# Patient Record
Sex: Female | Born: 1945 | Race: Asian | Hispanic: No | Marital: Married | Smoking: Current every day smoker
Health system: Southern US, Community
[De-identification: ages and names within clinical notes are randomized; demographics above are authoritative.]

## PROBLEM LIST (undated history)

## (undated) DIAGNOSIS — G43909 Migraine, unspecified, not intractable, without status migrainosus: Secondary | ICD-10-CM

## (undated) DIAGNOSIS — E119 Type 2 diabetes mellitus without complications: Secondary | ICD-10-CM

## (undated) DIAGNOSIS — N029 Recurrent and persistent hematuria with unspecified morphologic changes: Secondary | ICD-10-CM

## (undated) DIAGNOSIS — I10 Essential (primary) hypertension: Secondary | ICD-10-CM

## (undated) DIAGNOSIS — K224 Dyskinesia of esophagus: Secondary | ICD-10-CM

## (undated) DIAGNOSIS — E785 Hyperlipidemia, unspecified: Secondary | ICD-10-CM

## (undated) DIAGNOSIS — Z720 Tobacco use: Secondary | ICD-10-CM

## (undated) HISTORY — DX: Tobacco use: Z72.0

## (undated) HISTORY — DX: Dyskinesia of esophagus: K22.4

## (undated) HISTORY — DX: Recurrent and persistent hematuria with unspecified morphologic changes: N02.9

## (undated) HISTORY — PX: MOUTH SURGERY: SHX715

## (undated) HISTORY — DX: Essential (primary) hypertension: I10

## (undated) HISTORY — DX: Hyperlipidemia, unspecified: E78.5

## (undated) HISTORY — DX: Migraine, unspecified, not intractable, without status migrainosus: G43.909

## (undated) HISTORY — PX: ABDOMINAL HYSTERECTOMY: SHX81

## (undated) HISTORY — DX: Type 2 diabetes mellitus without complications: E11.9

---

## 1977-11-27 HISTORY — PX: TUBAL LIGATION: SHX77

## 2002-01-28 ENCOUNTER — Other Ambulatory Visit: Admission: RE | Admit: 2002-01-28 | Discharge: 2002-01-28 | Payer: Self-pay | Admitting: Gynecology

## 2002-04-14 ENCOUNTER — Ambulatory Visit (HOSPITAL_COMMUNITY): Admission: RE | Admit: 2002-04-14 | Discharge: 2002-04-14 | Payer: Self-pay | Admitting: Gynecology

## 2002-04-14 ENCOUNTER — Encounter (INDEPENDENT_AMBULATORY_CARE_PROVIDER_SITE_OTHER): Payer: Self-pay | Admitting: Specialist

## 2003-05-18 ENCOUNTER — Other Ambulatory Visit: Admission: RE | Admit: 2003-05-18 | Discharge: 2003-05-18 | Payer: Self-pay | Admitting: Gynecology

## 2006-02-06 ENCOUNTER — Ambulatory Visit: Payer: Self-pay | Admitting: Internal Medicine

## 2006-02-12 ENCOUNTER — Ambulatory Visit: Payer: Self-pay | Admitting: Internal Medicine

## 2006-03-12 ENCOUNTER — Ambulatory Visit: Payer: Self-pay | Admitting: Internal Medicine

## 2006-03-19 ENCOUNTER — Ambulatory Visit: Payer: Self-pay | Admitting: Internal Medicine

## 2006-06-21 ENCOUNTER — Ambulatory Visit: Payer: Self-pay | Admitting: Internal Medicine

## 2006-08-30 ENCOUNTER — Ambulatory Visit: Payer: Self-pay | Admitting: Internal Medicine

## 2006-09-06 ENCOUNTER — Ambulatory Visit: Payer: Self-pay | Admitting: Internal Medicine

## 2007-01-24 ENCOUNTER — Ambulatory Visit: Payer: Self-pay | Admitting: Pulmonary Disease

## 2007-02-22 ENCOUNTER — Ambulatory Visit: Payer: Self-pay | Admitting: Internal Medicine

## 2007-02-22 LAB — CONVERTED CEMR LAB
ALT: 25 units/L (ref 0–40)
AST: 26 units/L (ref 0–37)
Albumin: 3.4 g/dL — ABNORMAL LOW (ref 3.5–5.2)
Alkaline Phosphatase: 75 units/L (ref 39–117)
Calcium: 9.1 mg/dL (ref 8.4–10.5)
Chloride: 106 meq/L (ref 96–112)
GFR calc non Af Amer: 135 mL/min
Glucose, Bld: 108 mg/dL — ABNORMAL HIGH (ref 70–99)
Potassium: 4.3 meq/L (ref 3.5–5.1)
Total Bilirubin: 0.8 mg/dL (ref 0.3–1.2)
Total Protein: 6.3 g/dL (ref 6.0–8.3)

## 2007-03-06 ENCOUNTER — Ambulatory Visit: Payer: Self-pay | Admitting: Internal Medicine

## 2007-07-15 ENCOUNTER — Encounter: Payer: Self-pay | Admitting: Internal Medicine

## 2007-07-15 LAB — CONVERTED CEMR LAB

## 2007-09-02 ENCOUNTER — Ambulatory Visit: Payer: Self-pay | Admitting: Internal Medicine

## 2007-09-02 LAB — CONVERTED CEMR LAB
Basophils Absolute: 0 10*3/uL (ref 0.0–0.1)
CO2: 32 meq/L (ref 19–32)
Creatinine, Ser: 0.5 mg/dL (ref 0.4–1.2)
GFR calc Af Amer: 163 mL/min
Glucose, Bld: 106 mg/dL — ABNORMAL HIGH (ref 70–99)
LDL Cholesterol: 85 mg/dL (ref 0–99)
Lymphocytes Relative: 50.3 % — ABNORMAL HIGH (ref 12.0–46.0)
MCHC: 34.4 g/dL (ref 30.0–36.0)
Monocytes Absolute: 0.2 10*3/uL (ref 0.2–0.7)
Monocytes Relative: 3.8 % (ref 3.0–11.0)
Neutro Abs: 2.8 10*3/uL (ref 1.4–7.7)
Platelets: 249 10*3/uL (ref 150–400)
Potassium: 4.4 meq/L (ref 3.5–5.1)
RDW: 13 % (ref 11.5–14.6)
Sodium: 143 meq/L (ref 135–145)
Total Bilirubin: 1.1 mg/dL (ref 0.3–1.2)
Total CHOL/HDL Ratio: 2.7
Triglycerides: 159 mg/dL — ABNORMAL HIGH (ref 0–149)
VLDL: 32 mg/dL (ref 0–40)

## 2007-09-06 ENCOUNTER — Ambulatory Visit: Payer: Self-pay | Admitting: Internal Medicine

## 2007-10-30 ENCOUNTER — Encounter: Payer: Self-pay | Admitting: Internal Medicine

## 2007-10-30 DIAGNOSIS — I10 Essential (primary) hypertension: Secondary | ICD-10-CM | POA: Insufficient documentation

## 2007-10-30 DIAGNOSIS — M810 Age-related osteoporosis without current pathological fracture: Secondary | ICD-10-CM | POA: Insufficient documentation

## 2007-10-30 DIAGNOSIS — E785 Hyperlipidemia, unspecified: Secondary | ICD-10-CM | POA: Insufficient documentation

## 2007-10-30 DIAGNOSIS — F17200 Nicotine dependence, unspecified, uncomplicated: Secondary | ICD-10-CM | POA: Insufficient documentation

## 2007-10-30 DIAGNOSIS — D7289 Other specified disorders of white blood cells: Secondary | ICD-10-CM | POA: Insufficient documentation

## 2007-10-30 DIAGNOSIS — E119 Type 2 diabetes mellitus without complications: Secondary | ICD-10-CM | POA: Insufficient documentation

## 2007-10-31 ENCOUNTER — Ambulatory Visit: Payer: Self-pay | Admitting: Internal Medicine

## 2007-10-31 DIAGNOSIS — R42 Dizziness and giddiness: Secondary | ICD-10-CM | POA: Insufficient documentation

## 2007-11-29 ENCOUNTER — Ambulatory Visit: Payer: Self-pay | Admitting: Internal Medicine

## 2007-11-29 DIAGNOSIS — J40 Bronchitis, not specified as acute or chronic: Secondary | ICD-10-CM | POA: Insufficient documentation

## 2007-12-31 ENCOUNTER — Ambulatory Visit: Payer: Self-pay | Admitting: Internal Medicine

## 2007-12-31 ENCOUNTER — Telehealth: Payer: Self-pay | Admitting: Internal Medicine

## 2007-12-31 LAB — CONVERTED CEMR LAB
AST: 30 units/L (ref 0–37)
Cholesterol: 157 mg/dL (ref 0–200)
LDL Cholesterol: 80 mg/dL (ref 0–99)
Total CHOL/HDL Ratio: 2.7
Triglycerides: 96 mg/dL (ref 0–149)

## 2008-01-07 ENCOUNTER — Encounter: Payer: Self-pay | Admitting: Internal Medicine

## 2008-02-04 ENCOUNTER — Ambulatory Visit: Payer: Self-pay | Admitting: Internal Medicine

## 2008-02-06 ENCOUNTER — Encounter: Payer: Self-pay | Admitting: Internal Medicine

## 2008-06-16 ENCOUNTER — Ambulatory Visit: Payer: Self-pay | Admitting: Internal Medicine

## 2008-06-16 DIAGNOSIS — R0789 Other chest pain: Secondary | ICD-10-CM | POA: Insufficient documentation

## 2008-06-16 DIAGNOSIS — M25569 Pain in unspecified knee: Secondary | ICD-10-CM | POA: Insufficient documentation

## 2008-07-08 ENCOUNTER — Encounter: Payer: Self-pay | Admitting: Internal Medicine

## 2008-07-10 ENCOUNTER — Ambulatory Visit: Payer: Self-pay | Admitting: Internal Medicine

## 2008-07-10 LAB — CONVERTED CEMR LAB
AST: 33 units/L (ref 0–37)
CO2: 29 meq/L (ref 19–32)
Cholesterol: 130 mg/dL (ref 0–200)
Creatinine, Ser: 0.6 mg/dL (ref 0.4–1.2)
Creatinine,U: 29.3 mg/dL
GFR calc non Af Amer: 109 mL/min
Hgb A1c MFr Bld: 6.3 % — ABNORMAL HIGH (ref 4.6–6.0)
LDL Cholesterol: 58 mg/dL (ref 0–99)
Microalb, Ur: 0.8 mg/dL (ref 0.0–1.9)
Triglycerides: 95 mg/dL (ref 0–149)
VLDL: 19 mg/dL (ref 0–40)

## 2008-07-17 ENCOUNTER — Ambulatory Visit: Payer: Self-pay | Admitting: Internal Medicine

## 2009-01-14 ENCOUNTER — Ambulatory Visit: Payer: Self-pay | Admitting: Internal Medicine

## 2009-01-14 LAB — CONVERTED CEMR LAB
ALT: 45 units/L — ABNORMAL HIGH (ref 0–35)
AST: 34 units/L (ref 0–37)
Calcium: 9.8 mg/dL (ref 8.4–10.5)
GFR calc non Af Amer: 108 mL/min
Sodium: 141 meq/L (ref 135–145)

## 2009-01-18 ENCOUNTER — Ambulatory Visit: Payer: Self-pay | Admitting: Internal Medicine

## 2009-05-04 ENCOUNTER — Ambulatory Visit: Payer: Self-pay | Admitting: Internal Medicine

## 2009-05-04 LAB — CONVERTED CEMR LAB
ALT: 37 units/L — ABNORMAL HIGH (ref 0–35)
AST: 30 units/L (ref 0–37)
BUN: 15 mg/dL (ref 6–23)
CO2: 28 meq/L (ref 19–32)
Calcium: 9.3 mg/dL (ref 8.4–10.5)
Chloride: 110 meq/L (ref 96–112)
Glucose, Bld: 99 mg/dL (ref 70–99)
Hgb A1c MFr Bld: 6.1 % (ref 4.6–6.5)
Potassium: 4.5 meq/L (ref 3.5–5.1)

## 2009-05-05 ENCOUNTER — Encounter (INDEPENDENT_AMBULATORY_CARE_PROVIDER_SITE_OTHER): Payer: Self-pay | Admitting: *Deleted

## 2009-05-20 ENCOUNTER — Ambulatory Visit: Payer: Self-pay | Admitting: Internal Medicine

## 2009-07-06 ENCOUNTER — Telehealth: Payer: Self-pay | Admitting: Internal Medicine

## 2009-11-18 ENCOUNTER — Ambulatory Visit: Payer: Self-pay | Admitting: Internal Medicine

## 2009-11-18 LAB — CONVERTED CEMR LAB
ALT: 28 units/L (ref 0–35)
Albumin: 4.2 g/dL (ref 3.5–5.2)
Alkaline Phosphatase: 92 units/L (ref 39–117)
Calcium: 9.6 mg/dL (ref 8.4–10.5)
Chloride: 104 meq/L (ref 96–112)
Cholesterol: 170 mg/dL (ref 0–200)
Creatinine, Ser: 0.6 mg/dL (ref 0.4–1.2)
Glucose, Bld: 111 mg/dL — ABNORMAL HIGH (ref 70–99)
Potassium: 4.2 meq/L (ref 3.5–5.1)
Total Bilirubin: 1.2 mg/dL (ref 0.3–1.2)
Triglycerides: 145 mg/dL (ref 0.0–149.0)
VLDL: 29 mg/dL (ref 0.0–40.0)

## 2009-11-25 ENCOUNTER — Ambulatory Visit: Payer: Self-pay | Admitting: Internal Medicine

## 2009-12-24 ENCOUNTER — Ambulatory Visit: Payer: Self-pay | Admitting: Internal Medicine

## 2009-12-24 DIAGNOSIS — H8309 Labyrinthitis, unspecified ear: Secondary | ICD-10-CM | POA: Insufficient documentation

## 2010-06-10 ENCOUNTER — Ambulatory Visit: Payer: Self-pay | Admitting: Internal Medicine

## 2010-06-10 LAB — CONVERTED CEMR LAB
BUN: 17 mg/dL (ref 6–23)
CO2: 28 meq/L (ref 19–32)
Chloride: 107 meq/L (ref 96–112)
GFR calc non Af Amer: 127.13 mL/min (ref >60)
Hgb A1c MFr Bld: 6.2 % (ref 4.6–6.5)
Sodium: 138 meq/L (ref 135–145)

## 2010-06-16 ENCOUNTER — Ambulatory Visit: Payer: Self-pay | Admitting: Internal Medicine

## 2010-06-16 ENCOUNTER — Ambulatory Visit (HOSPITAL_BASED_OUTPATIENT_CLINIC_OR_DEPARTMENT_OTHER): Admission: RE | Admit: 2010-06-16 | Discharge: 2010-06-16 | Payer: Self-pay | Admitting: Internal Medicine

## 2010-06-16 ENCOUNTER — Ambulatory Visit: Payer: Self-pay | Admitting: Diagnostic Radiology

## 2010-06-16 DIAGNOSIS — R05 Cough: Secondary | ICD-10-CM

## 2010-06-16 DIAGNOSIS — R059 Cough, unspecified: Secondary | ICD-10-CM | POA: Insufficient documentation

## 2010-11-17 ENCOUNTER — Ambulatory Visit: Payer: Self-pay | Admitting: Internal Medicine

## 2010-11-17 ENCOUNTER — Encounter: Payer: Self-pay | Admitting: Internal Medicine

## 2010-11-17 LAB — CONVERTED CEMR LAB
Glucose, Urine, Semiquant: NEGATIVE
Nitrite: NEGATIVE
pH: 5

## 2010-12-05 ENCOUNTER — Ambulatory Visit: Admit: 2010-12-05 | Payer: Self-pay | Admitting: Internal Medicine

## 2010-12-20 ENCOUNTER — Ambulatory Visit
Admission: RE | Admit: 2010-12-20 | Discharge: 2010-12-20 | Payer: Self-pay | Source: Home / Self Care | Attending: Internal Medicine | Admitting: Internal Medicine

## 2010-12-20 DIAGNOSIS — F4321 Adjustment disorder with depressed mood: Secondary | ICD-10-CM | POA: Insufficient documentation

## 2010-12-20 DIAGNOSIS — H6690 Otitis media, unspecified, unspecified ear: Secondary | ICD-10-CM | POA: Insufficient documentation

## 2010-12-29 NOTE — Assessment & Plan Note (Signed)
Summary: uti ear ache/mhf   Vital Signs:  Patient profile:   65 year old female Height:      60 inches Weight:      127.25 pounds BMI:     24.94 O2 Sat:      99 % on Room air Temp:     97.8 degrees F oral Pulse rate:   76 / minute Resp:     18 per minute BP sitting:   140 / 90  (left arm) Cuff size:   regular  Vitals Entered By: Glendell Docker CMA (November 17, 2010 9:40 AM)  O2 Flow:  Room air CC: Urianry discomfort Is Patient Diabetic? No Pain Assessment Patient in pain? no      Comments pain with urination for the past 2 days, cranberry juice with no improvement   Primary Care Provider:  Dondra Spry DO  CC:  Urianry discomfort.  History of Present Illness: 65 y/o asian female co 2 days of dysuria no fever or chills no flank pain  Preventive Screening-Counseling & Management  Alcohol-Tobacco     Smoking Status: current  Allergies: 1)  ! Codeine 2)  ! * Antihistamines  Past History:  Past Medical History: Hypertension Hyperlipidemia Osteoporosis Tobacco Abuse     Benign hematuria - negative urologic workup  History of migraines     Past Surgical History: Colonoscopy 07/07      PMH-FH-SH reviewed-no changes except otherwise noted  Physical Exam  General:  alert, well-developed, and well-nourished.   Lungs:  normal respiratory effort and normal breath sounds.   Heart:  normal rate, regular rhythm, and no gallop.   Abdomen:  soft and non-tender.  no flank tenderness   Impression & Recommendations:  Problem # 1:  UTI (ICD-599.0)  Her updated medication list for this problem includes:    Cefuroxime Axetil 500 Mg Tabs (Cefuroxime axetil) ..... One by mouth two times a day  Encouraged to push clear liquids, get enough rest, and take acetaminophen as needed. To be seen in 10 days if no improvement, sooner if worse.  Medications Added to Medication List This Visit: 1)  Simvastatin 40 Mg Tabs (Simvastatin) .... One by mouth qpm 2)  Cefuroxime  Axetil 500 Mg Tabs (Cefuroxime axetil) .... One by mouth two times a day 3)  Lisinopril 20 Mg Tabs (Lisinopril) .... One by mouth once daily  Complete Medication List: 1)  Simvastatin 40 Mg Tabs (Simvastatin) .... One by mouth qpm 2)  Actonel 150 Mg Tabs (Risedronate sodium) .... One by mouth once monthly 3)  Fish Oil 1000 Mg Caps (Omega-3 fatty acids) .... Take 1 tablet by mouth two times a day 4)  Metformin Hcl 750 Mg Xr24h-tab (Metformin hcl) .... One by mouth bid 5)  Zolpidem Tartrate 5 Mg Tabs (Zolpidem tartrate) .... One to two tab at bedtime as needed 6)  Cefuroxime Axetil 500 Mg Tabs (Cefuroxime axetil) .... One by mouth two times a day 7)  Lisinopril 20 Mg Tabs (Lisinopril) .... One by mouth once daily  Other Orders: UA Dipstick w/o Micro (manual) (16109) T-Basic Metabolic Panel [phone]) T- Hemoglobin A1C (91478-29562) T-Hepatic Function [phone]) T-Lipid Profile (96295-28413) T-Culture, Urine (24401-02725) Specimen Handling (99000)  Patient Instructions: 1)  Keep your next appointment Prescriptions: METFORMIN HCL 750 MG XR24H-TAB (METFORMIN HCL) one by mouth bid  #180 x 5   Entered and Authorized by:   D. Thomos Lemons DO   Signed by:   D. Thomos Lemons DO on 11/17/2010   Method used:  Electronically to        [facility] [phone]* (retail)       [address] [place], [place]  [address]       Ph: [phone] or [phone]       Fax: [phone]   RxID:   [id number] SIMVASTATIN 40 MG TABS (SIMVASTATIN) one by mouth qpm  #90 x 1   Entered and Authorized by:   D. Thomos Lemons DO   Signed by:   D. Thomos Lemons DO on 11/17/2010   Method used:   Electronically to        [facility] [phone]* (retail)       [address] [place], [place]  [address]       Ph: [phone] or [phone]       Fax: [phone]   RxID:   [id number] LISINOPRIL 20 MG TABS (LISINOPRIL) one by mouth once daily  #90 x 1   Entered and Authorized by:   D. Thomos Lemons DO   Signed by:   D. Thomos Lemons DO on 11/17/2010   Method used:   Electronically to        [facility] [phone]* (retail)       [address] [place], [place]  [address]       Ph: [phone] or [phone]       Fax: [phone]   RxID:   [id number] CEFUROXIME AXETIL 500 MG TABS (CEFUROXIME AXETIL) one by mouth two times a day  #14 x 0   Entered and Authorized by:   D. Thomos Lemons DO   Signed by:   D. Thomos Lemons DO on 11/17/2010   Method used:   Electronically to        [facility] [phone]* (retail)       [address] [place], [place]  [address]       Ph: [phone] or [phone]       Fax: [phone]   RxID:   [id number]    Orders Added: 1)  UA Dipstick w/o Micro (manual) [81002] 2)  T-Basic Metabolic Panel [80048-22910] 3)  T- Hemoglobin A1C [83036-23375] 4)  T-Hepatic Function [80076-22960] 5)  T-Lipid Profile [80061-22930] 6)  T-Culture, Urine [11941-74081] 7)  Specimen Handling [99000] 8)  Est. Patient Level III [44818]    Current Allergies (reviewed today): ! CODEINE ! * ANTIHISTAMINES   Laboratory Results   Urine Tests    Routine Urinalysis   Color: yellow Appearance: Cloudy Glucose: negative   (Normal Range: Negative) Bilirubin: negative   (Normal Range: Negative) Ketone: negative   (Normal Range: Negative) Spec. Gravity: 1.010   (Normal Range: 1.003-1.035) Blood: large   (Normal Range: Negative) pH: 5.0   (Normal Range: 5.0-8.0) Protein: 30   (Normal Range: Negative) Urobilinogen: 0.2   (Normal Range: 0-1) Nitrite: negative   (Normal Range: Negative) Leukocyte Esterace: large   (Normal Range: Negative)

## 2010-12-29 NOTE — Assessment & Plan Note (Signed)
Summary: ? Inner-ear infection - jr   Vital Signs:  Patient profile:   65 year old female Weight:      126.25 pounds BMI:     24.75 O2 Sat:      95 % on Room air Temp:     97.4 degrees F Pulse rate:   75 / minute Pulse rhythm:   regular Resp:     16 per minute BP sitting:   120 / 80  (right arm) Cuff size:   regular  Vitals Entered By: Glendell Docker CMA (December 24, 2009 11:39 AM)  O2 Flow:  Room air  Primary Care Provider:  D. Thomos Lemons DO  CC:  c/o dizziness and dizziness in for initial evaluation.  History of Present Illness:       This is a 65 year old female who presents with dizziness in for initial evaluation.  The patient presents with dizziness and imbalance.  The episodes of dizziness occur several times a day.  When the episodes occur, they last for a few minutes and several minutes.  The dizzness is associated with nausea.  The episodes are brought on or made worse with bending forward.  recent cold.  left ear feels full.  symptoms started suddenly this AM    Allergies: 1)  ! Codeine 2)  ! * Antihistamines  Past History:  Past Medical History: Hypertension Hyperlipidemia Osteoporosis Tobacco Abuse   Benign hematuria - negative urologic workup  History of migraines     Past Surgical History: Colonoscopy 07/07      Family History: Mother deceased at age 61 with history of ovarian cancer She also had diabetes, hypertension, and high cholesterol Father deceased at age 19 with history of diabetes, hypertension and hyperlipidemia      Social History: Occupation:  Sports administrator Married Current Smoker (quit but restarted 1/2 pk per day)   Alcohol use-no     Physical Exam  General:  alert.  ill appearing Eyes:  pupils equal, pupils round, and pupils reactive to light.  no nystagmus Lungs:  normal respiratory effort.  slight coarse breath sounds and wheezing left base Heart:  normal rate, regular rhythm, and no gallop.   Neurologic:  cranial  nerves II-XII intact and gait normal.     Impression & Recommendations:  Problem # 1:  LABYRINTHITIS, ACUTE (ICD-386.30) recent URI.  c/o left ear discomfort and cough.  use diazepam as needed.  zofran for nausea.  push fluids once nausea improves.  Call our office if your symptoms do not  improve or gets worse.  she has coarse breath sounds left base.  I suspect early bronchitis.  ceftin 500 mg by mouth two times a day.  Complete Medication List: 1)  Lisinopril-hydrochlorothiazide 20-12.5 Mg Tabs (Lisinopril-hydrochlorothiazide) .... One by mouth qd 2)  Simvastatin 40 Mg Tabs (Simvastatin) .... One by mouth qpm 3)  Actonel 150 Mg Tabs (Risedronate sodium) .... One by mouth once monthly 4)  Fish Oil 1000 Mg Caps (Omega-3 fatty acids) .... Take 1 tablet by mouth two times a day 5)  Metformin Hcl 750 Mg Xr24h-tab (Metformin hcl) .... One by mouth bid 6)  Prilosec 20 Mg Cpdr (Omeprazole) .... One by mouth two times a day before meals 7)  Zostavax 13086 Unt/0.71ml Solr (Zoster vaccine live) .... Administer vaccine x 1 8)  Diazepam 2 Mg Tabs (Diazepam) .... One by mouth two times a day as needed dizziness 9)  Cefuroxime Axetil 500 Mg Tabs (Cefuroxime axetil) .... One by  mouth two times a day 10)  Ondansetron Hcl 4 Mg Tabs (Ondansetron hcl) .... One by mouth three times a day nausea  Patient Instructions: 1)  Call our office if your symptoms do not  improve or gets worse. Prescriptions: ONDANSETRON HCL 4 MG TABS (ONDANSETRON HCL) one by mouth three times a day nausea  #21 x 0   Entered and Authorized by:   D. Thomos Lemons DO   Signed by:   D. Thomos Lemons DO on 12/24/2009   Method used:   Electronically to        [facility] [phone]* (retail)       [address] [place], [place]  [address]       Ph: [phone] or [phone]       Fax: [phone]   RxID:   [id number] CEFUROXIME AXETIL 500 MG TABS (CEFUROXIME AXETIL) one by mouth two times a day  #14 x 0   Entered and  Authorized by:   D. Thomos Lemons DO   Signed by:   D. Thomos Lemons DO on 12/24/2009   Method used:   Electronically to        [facility] [phone]* (retail)       [address] [place], [place]  [address]       Ph: [phone] or [phone]       Fax: [phone]   RxID:   [id number] DIAZEPAM 2 MG TABS (DIAZEPAM) one by mouth two times a day as needed dizziness  #30 x 0   Entered and Authorized by:   D. Thomos Lemons DO   Signed by:   D. Thomos Lemons DO on 12/24/2009   Method used:   Print then Give to Patient   RxID:   [id number]   Current Allergies (reviewed today): ! CODEINE ! * ANTIHISTAMINES

## 2010-12-29 NOTE — Letter (Signed)
   [place] [facility] [address] [place], [place]  [address]  [place] Phone: [phone]      June 16, 2010   [facility] [address] [place], [place] [address]  RE:  LAB RESULTS  Dear  Ms. Lutter,  The following is an interpretation of your most recent lab tests.  Please take note of any instructions provided or changes to medications that have resulted from your lab work.     Chest x ray - normal       Sincerely Yours,    Dr. Thomos Lemons

## 2010-12-29 NOTE — Assessment & Plan Note (Signed)
Summary: 6 month checkup/dt   Vital Signs:  Patient profile:   65 year old female Height:      60 inches Weight:      122.50 pounds BMI:     24.01 O2 Sat:      98 % on Room air Temp:     97.8 degrees F oral Pulse rate:   70 / minute Pulse rhythm:   regular Resp:     16 per minute BP sitting:   110 / 76  (left arm) Cuff size:   regular  Vitals Entered By: Glendell Docker CMA (June 16, 2010 10:28 AM)  O2 Flow:  Room air CC: Rm 2- 6 Month Follow up , Type 2 diabetes mellitus follow-up Is Patient Diabetic? No Pain Assessment Patient in pain? no       Does patient need assistance? Functional Status Self care Ambulation Normal Comments referral for GYN, no concerns   Primary Care Provider:  DThomos Lemons DO  CC:  Rm 2- 6 Month Follow up  and Type 2 diabetes mellitus follow-up.  History of Present Illness:  Type 2 Diabetes Mellitus Follow-Up      This is a 65 year old woman who presents for Type 2 diabetes mellitus follow-up.  The patient denies self managed hypoglycemia, hypoglycemia requiring help, and weight gain.  The patient denies the following symptoms: chest pain.  Since the last visit the patient reports good dietary compliance, compliance with medications, and exercising regularly.  Htn  - stable  planning trip to [place] to visit aunt  Preventive Screening-Counseling & Management  Alcohol-Tobacco     Smoking Status: current     Smoking Cessation Counseling: yes  Allergies: 1)  ! Codeine 2)  ! * Antihistamines  Past History:  Past Medical History: Hypertension Hyperlipidemia Osteoporosis Tobacco Abuse    Benign hematuria - negative urologic workup  History of migraines     Family History: Mother deceased at age 60 with history of ovarian cancer She also had diabetes, hypertension, and high cholesterol Father deceased at age 40 with history of diabetes, hypertension and hyperlipidemia       Social History: Occupation:  Public affairs consultant Married Current Smoker (quit but restarted 1/2 pk per day)   Alcohol use-no      Review of Systems       no change in smoking status.  occ non productive cough  Physical Exam  General:  alert, well-developed, and well-nourished.   Neck:  supple and no masses.  no carotid bruits.   Lungs:  normal respiratory effort.  slight coarse breath sounds and wheezing left base Heart:  normal rate, regular rhythm, and no gallop.   Extremities:  No lower extremity edema    Impression & Recommendations:  Problem # 1:  HYPERTENSION (ICD-401.9)  Her updated medication list for this problem includes:    Lisinopril-hydrochlorothiazide 20-12.5 Mg Tabs (Lisinopril-hydrochlorothiazide) ..... One by mouth qd  Problem # 2:  COUGH (ICD-786.2)  Orders: T-2 View CXR, Same Day (71020.5TC)  Problem # 3:  OTHER ABNORMAL GLUCOSE (ICD-790.29)  Her updated medication list for this problem includes:    Metformin Hcl 750 Mg Xr24h-tab (Metformin hcl) ..... One by mouth bid  Complete Medication List: 1)  Lisinopril-hydrochlorothiazide 20-12.5 Mg Tabs (Lisinopril-hydrochlorothiazide) .... One by mouth qd 2)  Simvastatin 40 Mg Tabs (Simvastatin) .... One by mouth qpm 3)  Actonel 150 Mg Tabs (Risedronate sodium) .... One by mouth once monthly 4)  Fish Oil 1000 Mg Caps (Omega-3  fatty acids) .... Take 1 tablet by mouth two times a day 5)  Metformin Hcl 750 Mg Xr24h-tab (Metformin hcl) .... One by mouth bid 6)  Zolpidem Tartrate 5 Mg Tabs (Zolpidem tartrate) .... One to two tab at bedtime as needed  Patient Instructions: 1)  Please schedule a follow-up appointment in 6 months. 2)  BMP prior to visit, ICD-9:  401.9 3)  Hepatic Panel prior to visit, ICD-9: 272.4 4)  Lipid Panel prior to visit, ICD-9: 272.4 5)  HbgA1C prior to visit, ICD-9:  790.29 6)  Please return for lab work one (1) week before your next appointment.  Prescriptions: ZOLPIDEM TARTRATE 5 MG TABS (ZOLPIDEM TARTRATE) one to two tab at  bedtime as needed  #30 x 0   Entered and Authorized by:   D. Thomos Lemons DO   Signed by:   D. Thomos Lemons DO on 06/16/2010   Method used:   Print then Give to Patient   RxID:   [id number]   Current Allergies (reviewed today): ! CODEINE ! * ANTIHISTAMINES   Immunization History:  Zostavax History:    Zostavax # 1:  zostavax (01/10/2010)

## 2011-01-12 NOTE — Assessment & Plan Note (Signed)
Summary: 6 MONTH FOLLOW UP/MHF   Vital Signs:  Patient profile:   65 year old female Height:      60 inches Weight:      123.75 pounds BMI:     24.26 O2 Sat:      100 % on Room air Temp:     98.2 degrees F oral Pulse rate:   94 / minute Resp:     18 per minute BP sitting:   122 / 90  (right arm) Cuff size:   regular  Vitals Entered By: Glendell Docker CMA (December 20, 2010 2:25 PM)  O2 Flow:  Room air CC: 6 Month Follow up  Is Patient Diabetic? No Pain Assessment Patient in pain? no      Comments did not have blood work, she out of country for death in family   Primary Care Provider:  D. Thomos Lemons DO  CC:  6 Month Follow up .  History of Present Illness: 65 y/o femal for f/u went home for holidays cousin's wife committed suicide  she notes sign grief she knew cousin's wife very well.  she helped take care of her since childhood cousin's wife had lupus  poor sleeep no sign change in wt  also c/o URI symptoms ears feel full   Preventive Screening-Counseling & Management  Alcohol-Tobacco     Smoking Status: current  Allergies: 1)  ! Codeine 2)  ! 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   Ears:  left TM red and retracted Lungs:  normal respiratory effort and normal breath sounds.   Heart:  normal rate, regular rhythm, and no gallop.    Psych:  normally interactive, good eye contact, and depressed affect.     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  Problem # 2:  OTITIS MEDIA, LEFT (ICD-382.9)  The following medications were removed from the medication list:    Cefuroxime Axetil 500 Mg Tabs (Cefuroxime axetil) ..... One by mouth two times a day Her updated medication list for this problem includes:    Cefuroxime Axetil 500 Mg Tabs (Cefuroxime axetil) ..... One by mouth two times a day  Instructed on prevention and treatment. Call if no improvement in 48-72 hours or sooner if worsening symptoms.   Complete Medication List: 1)  Simvastatin 40 Mg Tabs (Simvastatin) .... One by mouth qpm 2)  Actonel 150 Mg Tabs (Risedronate sodium) .... One by mouth once monthly 3)  Fish Oil 1000 Mg Caps (Omega-3 fatty acids) .... Take 1 tablet by mouth two times a day 4)  Metformin Hcl 750 Mg Xr24h-tab (Metformin hcl) .... One by  mouth bid 5)  Zolpidem Tartrate 5 Mg Tabs (Zolpidem tartrate) .... One to two tab at bedtime as needed 6)  Lisinopril 20 Mg Tabs (Lisinopril) .... One by mouth once daily 7)  Cefuroxime Axetil 500 Mg Tabs (Cefuroxime axetil) .... One by mouth two times a day  Patient Instructions: 1)  Call our office if your symptoms do not  improve or gets worse. Prescriptions: ZOLPIDEM TARTRATE 5 MG TABS (ZOLPIDEM TARTRATE) one to two tab at bedtime as needed  #30 x 1   Entered and Authorized by:   D. Thomos Lemons DO   Signed by:   D. Thomos Lemons DO on 12/20/2010   Method used:   Print then Give to Patient   RxID:   [id number] CEFUROXIME AXETIL 500 MG TABS (CEFUROXIME AXETIL) one by mouth two times a day  #14 x 0   Entered and Authorized by:   D. Thomos Lemons DO   Signed by:   D.  Thomos Lemons DO on 12/20/2010   Method used:   Electronically to        [facility] [phone]* (retail)       [address] [place], [place]  [address]       Ph: [phone] or [phone]       Fax: [phone]   RxID:   [id number]    Orders Added: 1)  Est. Patient Level III [60109]

## 2011-02-16 ENCOUNTER — Telehealth: Payer: Self-pay | Admitting: Internal Medicine

## 2011-02-16 NOTE — Telephone Encounter (Signed)
Created in error. Informed dr. Artist Pais verbally. He called pt.

## 2011-04-14 NOTE — H&P (Signed)
[facility]  Patient:    KIYANNA, BIEGLER Visit Number: [id number] MRN: [id number]          Service Type: Attending:  Nadyne Coombes. Fontaine, M.D. Dictated by:   Nadyne Coombes. Fontaine, M.D. Adm. Date:  04/14/02                           History and Physical  CHIEF COMPLAINT:              Endometrial abnormality.  HISTORY OF PRESENT ILLNESS:   A 65 year old G31, P3 female postmenopausal with a family history of ovarian cancer in that her mother died from this.  She underwent routine screening with a CA-125 which was normal and an ultrasound which revealed both ovaries to be normal, postmenopausal in size, although there was an echogenic focus noted within the endometrial cavity.  The patient subsequently underwent a sonohysterogram which confirmed the presence of an 8 x 3 mm echogenic defect and she is admitted at this time for hysteroscopy, removal of the defect.  PAST MEDICAL HISTORY:         Elevated glucoses for which she is followed with diet.  PAST SURGICAL HISTORY:        None.  ALLERGIES:                    CODEINE.  MEDICATIONS:                  Lexapro 10 mg q.d. for anxiety/depressive symptoms.  REVIEW OF SYSTEMS:            Noncontributory.  SOCIAL HISTORY:               Noncontributory.  FAMILY HISTORY:               Significant for ovarian cancer in her mother.  PHYSICAL EXAMINATION  VITAL SIGNS:                  Afebrile.  Vital signs are stable.  HEENT:                        Normal.  LUNGS:                        Clear.  CARDIAC:                      Regular rate and rhythm without rubs, murmurs, gallops.  ABDOMEN:                      Benign.  PELVIC:                       External BUS, vagina normal.  Cervix normal. Uterus normal size, nontender.  Adnexa without masses or tenderness.  ASSESSMENT:                   A 65 year old female underwent ultrasound screening for ovarian cancer.  Was found to have echogenic  focus of the endometrial cavity for hysteroscopy, dilatation and curettage.  I reviewed what is involved with the procedure to include the expected intraoperative/postoperative courses.  The risks were discussed to include bleeding, transfusion, infection, prolonged antibiotics, uterine perforation, damage to internal organs including bowel, bladder, ureters, vessels, and nerves necessitating major exploratory repairative surgeries and future repairative surgeries including ostomy formation.  The patient understands the risks and wants to proceed with the surgery. Dictated by:   Nadyne Coombes. Fontaine, M.D. Attending:  Nadyne Coombes. Fontaine, M.D. DD:  04/13/02 TD:  04/13/02 Job: [id number]

## 2011-04-14 NOTE — Assessment & Plan Note (Signed)
[facility]                             PULMONARY OFFICE NOTE   Shelly Clark, Shelly Clark                        MRN:          [id number]  DATE:01/24/2007                            DOB:          18-Oct-1946    HISTORY OF PRESENT ILLNESS:  This is a 65 year old [place] female  patient of Dr. Olegario Messier who presents for an acute office visit. The patient  complains of a 1 week history of nasal congestion, sore throat,  productive cough with thick clear to yellow sputum and body aches. The  patient complains that over the last week symptoms have progressively  worsened and now she has a fever. The patient does continue to smoke  against medical advice. The patient denies any recent travel, antibiotic  use, chest pain, shortness of breath, or hemoptysis or weight loss.   PAST MEDICAL HISTORY:  Reviewed.   CURRENT MEDICATIONS:  Reviewed.   PHYSICAL EXAMINATION:  GENERAL:  The patient is a pleasant female in no  acute distress.  VITAL SIGNS:  Temperature is 100.3, blood pressure 142/80. O2 saturation  is 95% on room air  HEENT:  Nasal mucosa with some mild erythema. The posterior phx is  clear.  NECK:  Supple without adenopathy. No JVD.  LUNGS:  Lung sounds are clear with no wheezing, no crackles.  CARDIAC:  Regular rate and rhythm.  ABDOMEN:  Soft and nontender. No hepatosplenomegaly appreciated.  EXTREMITIES:  Warm without any edema.   IMPRESSION/PLAN:  Acute upper respiratory infection. The patient to  begin Omnicef x7 days. Mucinex DM twice daily. Tussionex 4 ounces 1  teaspoon every 12 hours as needed for cough. The patient is to return  back with Dr. Artist Pais as scheduled or sooner if needed. The patient is  encouraged on smoking cessation.      Rubye Oaks, NP  Electronically Signed      Lonzo Cloud. Kriste Basque, MD  Electronically Signed   TP/MedQ  DD: 01/25/2007  DT: 01/25/2007  Job #: [id number]

## 2011-04-14 NOTE — Op Note (Signed)
[facility]  Patient:    Shelly Clark, Shelly Clark Visit Number: [id number] MRN: [id number]          Service Type: DSU Location: [facility] Attending Physician:  Merrily Pew Dictated by:   Nadyne Coombes Fontaine, M.D. Proc. Date: 04/14/02 Admit Date:  04/14/2002 Discharge Date: 04/14/2002                             Operative Report  PREOPERATIVE DIAGNOSIS:       Endometrial defect.  POSTOPERATIVE DIAGNOSIS:      Endometrial defect.  OPERATION:                    Hysteroscopic resection of probable endometrial                               polyp and D&C.  SURGEON:                      Timothy P. Fontaine, M.D.  ANESTHESIA:                   MAC with paracervical block and 1% lidocaine.  ESTIMATED BLOOD LOSS:         Minimal.  COMPLICATIONS:                None.  SORBITOL DISCREPANCY:         350 cc.  FINDINGS:                     Raised area consistent with a sessile polyp versus small myoma, lower uterine segment on the left excised, and when passed, hysteroscopy otherwise normal, noting normal fundus, anterior and posterior uterine surfaces, lower uterine segment, endocervical canal, right and left tubal ostia visualized.  DESCRIPTION OF PROCEDURE:     The patient was taken to the operating room and underwent MAC anesthesia, and placed in the dorsolithotomy position, and received a perineal and vaginal preparation with Betadine solution.  The bladder emptied with in and out Foley catheterization, and the patient was draped in the usual fashion.  The cervix was visualized and grasped with a single tooth tenaculum and a 1% lidocaine paracervical block was placed, a total of 10 cc used.  The cervix was then gently and gradually dilated to admit the operating hysteroscope, and hysteroscopy was performed with the findings as noted above.  Using the right angle loupe, the left lower uterine segment abnormality was excised in a single pass, and sent  to pathology.  A gentle sharp curettage was also performed, although there was a very atrophic pattern and scant return was noted.  There was fundal preparation into the myometrium secondary to the dilators that was noted upon initial entry into the cavity, although it did not appear that this was a transmural perforation as there was good distention and no apparent fluid loss once distention was achieved.  This did not hamper the procedure and on multiple inflations and deflations of the cavity, again there was no evidence of perforation.  There was some slight oozing at the myometrial and endometrial junction, and using the coagulation power, coagulation was delivered to this point to achieve ultimate hemostasis.  The instruments were then removed, adequate hemostasis was visualized vaginally.  The patient was placed in the supine position and was taken to the recovery room in good condition  having tolerated the procedure well. Dictated by:   Nadyne Coombes. Fontaine, M.D. Attending Physician:  Merrily Pew DD:  04/14/02 TD:  04/16/02 Job: [id number]

## 2011-06-12 ENCOUNTER — Ambulatory Visit (INDEPENDENT_AMBULATORY_CARE_PROVIDER_SITE_OTHER): Payer: 59 | Admitting: Internal Medicine

## 2011-06-12 ENCOUNTER — Encounter: Payer: Self-pay | Admitting: Internal Medicine

## 2011-06-12 DIAGNOSIS — E785 Hyperlipidemia, unspecified: Secondary | ICD-10-CM

## 2011-06-12 DIAGNOSIS — E119 Type 2 diabetes mellitus without complications: Secondary | ICD-10-CM

## 2011-06-12 DIAGNOSIS — R7309 Other abnormal glucose: Secondary | ICD-10-CM

## 2011-06-12 DIAGNOSIS — I10 Essential (primary) hypertension: Secondary | ICD-10-CM

## 2011-06-12 LAB — LIPID PANEL
HDL: 66 mg/dL (ref >39)
LDL Cholesterol: 117 mg/dL — ABNORMAL HIGH (ref 0–99)
Triglycerides: 189 mg/dL — ABNORMAL HIGH (ref <150)

## 2011-06-12 LAB — CBC WITH DIFFERENTIAL/PLATELET
Basophils Absolute: 0.1 10*3/uL (ref 0.0–0.1)
Basophils Relative: 1 % (ref 0–1)
HCT: 43.3 % (ref 36.0–46.0)
Lymphs Abs: 3.2 10*3/uL (ref 0.7–4.0)
MCH: 28.6 pg (ref 26.0–34.0)
MCHC: 33.7 g/dL (ref 30.0–36.0)
MCV: 84.7 fL (ref 78.0–100.0)
Monocytes Relative: 4 % (ref 3–12)
Neutro Abs: 2.5 10*3/uL (ref 1.7–7.7)
Neutrophils Relative %: 41 % — ABNORMAL LOW (ref 43–77)
Platelets: 260 10*3/uL (ref 150–400)
RBC: 5.11 MIL/uL (ref 3.87–5.11)

## 2011-06-12 LAB — HEPATIC FUNCTION PANEL
ALT: 28 U/L (ref 0–35)
Albumin: 4.6 g/dL (ref 3.5–5.2)
Alkaline Phosphatase: 106 U/L (ref 39–117)

## 2011-06-12 LAB — BASIC METABOLIC PANEL
Calcium: 10.1 mg/dL (ref 8.4–10.5)
Glucose, Bld: 102 mg/dL — ABNORMAL HIGH (ref 70–99)
Potassium: 4.8 mEq/L (ref 3.5–5.3)
Sodium: 141 mEq/L (ref 135–145)

## 2011-06-12 MED ORDER — ALBUTEROL SULFATE HFA 108 (90 BASE) MCG/ACT IN AERS: 2.0000 | INHALATION_SPRAY | Freq: Four times a day (QID) | RESPIRATORY_TRACT | Status: DC | PRN

## 2011-06-12 MED ORDER — SIMVASTATIN 40 MG PO TABS: 40.0000 mg | ORAL_TABLET | Freq: Every day | ORAL | Status: DC

## 2011-06-12 MED ORDER — ZOLPIDEM TARTRATE 5 MG PO TABS: 5.0000 mg | ORAL_TABLET | Freq: Every evening | ORAL | Status: DC | PRN

## 2011-06-12 MED ORDER — LISINOPRIL 20 MG PO TABS: 20.0000 mg | ORAL_TABLET | Freq: Every day | ORAL | Status: DC

## 2011-06-12 MED ORDER — METFORMIN HCL ER 750 MG PO TB24: 750.0000 mg | ORAL_TABLET | Freq: Every day | ORAL | Status: DC

## 2011-06-13 LAB — HEMOGLOBIN A1C: Mean Plasma Glucose: 128 mg/dL — ABNORMAL HIGH (ref <117)

## 2011-06-18 NOTE — Assessment & Plan Note (Signed)
Stable. Obtain fasting lipid profile and liver function tests 

## 2011-06-18 NOTE — Progress Notes (Signed)
  Subjective:    Patient ID: Shelly Clark, female    DOB: Jun 05, 1946, 65 y.o.   MRN: [id number]  HPI Patient presents to clinic for evaluation of multiple medical problems. History of borderline diabetes tolerating metformin without GI adverse effect. Tolerate statin therapy without myalgias or abnormal LFTs. Blood pressure minimally elevated without headache or dizziness. No other complaints.  Reviewed past medical history, medications and allergies  Review of Systems see history of present illness     Objective:   Physical Exam    Physical Exam  Vitals reviewed. Constitutional:  appears well-developed and well-nourished. No distress.  HENT:  Head: Normocephalic and atraumatic.  Nose: Nose normal.  Mouth/Throat: Oropharynx is clear and moist. No oropharyngeal exudate.  Eyes: Conjunctivae clear. Right eye exhibits no discharge. Left eye exhibits no discharge. No scleral icterus.  Neck: Neck supple. No thyromegaly present.  Cardiovascular: Normal rate, regular rhythm and normal heart sounds.  Exam reveals no gallop and no friction rub.   No murmur heard. Pulmonary/Chest: Effort normal and breath sounds normal. No respiratory distress.  has no wheezes.  has no rales.  Lymphadenopathy:   no cervical adenopathy.  Neurological:  is alert.  Skin: Skin is warm and dry.  not diaphoretic.  Psychiatric: normal mood and affect.      Assessment & Plan:

## 2011-06-18 NOTE — Assessment & Plan Note (Signed)
Minimally suboptimal. Recommend out patient blood pressure log to be submitted for review.

## 2011-06-18 NOTE — Assessment & Plan Note (Signed)
Obtain CBC Chem-7 and A1c

## 2011-10-31 ENCOUNTER — Other Ambulatory Visit: Payer: Self-pay | Admitting: Internal Medicine

## 2011-10-31 NOTE — Telephone Encounter (Signed)
Is it okay to provide refill of Ambien for this patient? She has follow up scheduled with Dr Rodena Medin in January 2013.

## 2011-11-03 NOTE — Telephone Encounter (Signed)
Verbal refill provided to pharmacist for one month.

## 2011-11-03 NOTE — Telephone Encounter (Signed)
ok 

## 2011-12-11 ENCOUNTER — Ambulatory Visit: Payer: 59 | Admitting: Internal Medicine

## 2011-12-18 ENCOUNTER — Ambulatory Visit: Payer: 59 | Admitting: Internal Medicine

## 2012-01-04 ENCOUNTER — Ambulatory Visit: Payer: 59

## 2012-01-04 ENCOUNTER — Ambulatory Visit: Payer: 59 | Admitting: Family Medicine

## 2012-01-04 ENCOUNTER — Encounter: Payer: Self-pay | Admitting: Physician Assistant

## 2012-01-04 VITALS — BP 124/80 | HR 86 | Temp 98.6°F | Resp 18 | Ht 60.0 in | Wt 129.6 lb

## 2012-01-04 DIAGNOSIS — J40 Bronchitis, not specified as acute or chronic: Secondary | ICD-10-CM

## 2012-01-04 DIAGNOSIS — R509 Fever, unspecified: Secondary | ICD-10-CM

## 2012-01-04 DIAGNOSIS — R05 Cough: Secondary | ICD-10-CM

## 2012-01-04 DIAGNOSIS — R5383 Other fatigue: Secondary | ICD-10-CM

## 2012-01-04 DIAGNOSIS — R5381 Other malaise: Secondary | ICD-10-CM

## 2012-01-04 DIAGNOSIS — R059 Cough, unspecified: Secondary | ICD-10-CM

## 2012-01-04 LAB — POCT CBC
Granulocyte percent: 76 %G (ref 37–80)
Hemoglobin: 13.5 g/dL (ref 12.2–16.2)
Lymph, poc: 2.7 (ref 0.6–3.4)
MCHC: 32 g/dL (ref 31.8–35.4)
MCV: 86.2 fL (ref 80–97)
MID (cbc): 0.7 (ref 0–0.9)
POC Granulocyte: 10.6 — AB (ref 2–6.9)
POC MID %: 4.8 %M (ref 0–12)
Platelet Count, POC: 307 10*3/uL (ref 142–424)
RBC: 4.89 M/uL (ref 4.04–5.48)
WBC: 13.9 10*3/uL — AB (ref 4.6–10.2)

## 2012-01-04 MED ORDER — AZITHROMYCIN 500 MG PO TABS: 500.0000 mg | ORAL_TABLET | Freq: Every day | ORAL | Status: AC

## 2012-01-04 MED ORDER — BENZONATATE 200 MG PO CAPS: 200.0000 mg | ORAL_CAPSULE | Freq: Three times a day (TID) | ORAL | Status: AC | PRN

## 2012-01-04 NOTE — Progress Notes (Signed)
Subjective:    Patient ID: Shelly Clark, female    DOB: 1946/06/14, 66 y.o.   MRN: [id number]  Primary Physician: Shelly Clark  Chief Complaint: Cough, congestion, fever, and ST for 5 days.  HPI 66 y.o. y/o Asian female with history noted below presents with complaint of 5 day history of nasal congestion, cough, ST, and fever. Reports fever of 101 several days ago. None since. Nasal congestion thick and yellow. Cough is productive of green to yellow sputum. Cough is worse at night. Now developing sinus pressure along the bilateral maxillary sinuses. Normal appetite. Pushing fluids.   Patient has stopped all of her chronic medications approximately 6 months prior. She states that her PCP is aware of this decision. She is controlling both her DM and HTN with diet and exercise. Her last appt with Shelly Clark was 6 months prior when this change was made. Patient reports her A1C at this time was "5 point something." She has appt with Shelly Clark for follow up within the next week. She states this is her decision to stop her medications and verbalizes the risks of doing so.  No h/o CA, sedentary periods, or leg trauma.   Past Medical History  Diagnosis Date  . Hyperlipidemia   . Hypertension   . Osteoporosis   . Tobacco abuse   . Benign hematuria     neg urology workup  . Migraines     Prior to Admission medications   Medication Sig Start Date End Date Taking? Authorizing Provider  OTC cold preps                                     Allergies  Allergen Reactions  . Codeine     History   Social History  . Marital Status: Married    Spouse Name: N/A    Number of Children: N/A  . 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 Smoking status: Current Everyday Smoker -- 1.0 packs/day for 40 years    Types: Cigarettes  . Smokeless tobacco: Never Used  . Alcohol Use: No  . Drug Use: No  . Sexually Active: None    Other Topics Concern  . None   Social History Narrative  . None    Family History  Problem Relation Age of Onset  . Cancer Mother     ovarian  . Diabetes Mother   . Hypertension Mother   . Hyperlipidemia Mother   . Diabetes Father   . Hypertension Father   . Hyperlipidemia Father         Review of Systems  Constitutional: Positive for fever (Onset) and fatigue. Negative for chills, diaphoresis and appetite change.  HENT: Positive for congestion, sore throat, postnasal drip and sinus pressure. Negative for hearing loss, rhinorrhea, sneezing, drooling, mouth sores, trouble swallowing, neck stiffness, dental problem and voice change.   Respiratory: Positive for cough. Negative for apnea, choking, chest tightness, shortness of breath, wheezing and stridor.   Cardiovascular: Negative for chest pain, palpitations and leg swelling.  Gastrointestinal: Negative for nausea and vomiting.       Objective:   Physical Exam  Constitutional: She is oriented to person, place, and time. She appears well-developed and well-nourished. No distress.  HENT:  Head: Normocephalic and atraumatic.  Right Ear: Hearing, tympanic membrane, external  ear and ear canal normal.  Left Ear: Hearing, tympanic membrane, external ear and ear canal normal.  Nose: Nose normal. Right sinus exhibits no maxillary sinus tenderness and no frontal sinus tenderness. Left sinus exhibits no maxillary sinus tenderness and no frontal sinus tenderness.  Mouth/Throat: Uvula is midline, oropharynx is clear and moist and mucous membranes are normal. No oropharyngeal exudate, posterior oropharyngeal edema, posterior oropharyngeal erythema or tonsillar abscesses.  Eyes: Conjunctivae and EOM are normal. Pupils are equal, round, and reactive to light. Right eye exhibits no discharge. Left eye exhibits no discharge. No scleral icterus.  Neck: Normal range of motion. Neck supple. No tracheal deviation present. No thyromegaly  present.  Cardiovascular: Normal rate, regular rhythm and normal heart sounds.  Exam reveals no gallop and no friction rub.   No murmur heard. Pulmonary/Chest: Effort normal and breath sounds normal. No respiratory distress. She has no wheezes. She has no rales. She exhibits no tenderness.  Lymphadenopathy:    She has no cervical adenopathy.  Neurological: She is alert and oriented to person, place, and time.  Skin: Skin is warm and dry. She is not diaphoretic.  Psychiatric: She has a normal mood and affect. Her behavior is normal. Judgment and thought content normal.      [facility] reading (PRIMARY) by  Shelly Clark. NAD.      Assessment & Plan:  66 y.o. Asian female with early bronchitis and cough. 1.  Outpatient Encounter Prescriptions as of 01/04/2012  Medication Sig Dispense Refill        . azithromycin (ZITHROMAX) 500 MG tablet Take 1 tablet (500 mg total) by mouth daily.  5 tablet  0  . benzonatate (TESSALON) 200 MG capsule Take 1 capsule (200 mg total) by mouth 3 (three) times daily as needed for cough.  30 capsule  0  2. Mucinex 3. Tylenol/Motrin prn 4. Rest/fluids 5. Follow up as directed with PCP for chronic illness management 6. RTC precautions  Signed,  Shelly Clark 01/04/2012 1:54 PM

## 2012-01-04 NOTE — Progress Notes (Signed)
  Subjective:    Patient ID: Shelly Clark, female    DOB: [birth year], 67 y.o.   MRN: [id number]  HPI    Review of Systems     Objective:   Physical Exam        Assessment & Plan:

## 2012-01-04 NOTE — Patient Instructions (Signed)

## 2012-01-09 ENCOUNTER — Telehealth: Payer: Self-pay | Admitting: Internal Medicine

## 2012-01-09 NOTE — Telephone Encounter (Signed)
Pt is sch for follow up appt for medications. Pt would like to having fasting labwork prior to 01-17-12. What labs can I sch for pt?

## 2012-01-10 NOTE — Telephone Encounter (Signed)
Pt daughter Bonita Quin will call back tomorrow

## 2012-01-10 NOTE — Telephone Encounter (Signed)
BMET - 401.9 A1c - 790.29 Lipid panel and LFTs - 272.4

## 2012-01-11 NOTE — Telephone Encounter (Signed)
Pt is sch for 01-12-2012 10.15am fasting

## 2012-01-12 ENCOUNTER — Other Ambulatory Visit (INDEPENDENT_AMBULATORY_CARE_PROVIDER_SITE_OTHER): Payer: 59

## 2012-01-12 DIAGNOSIS — R7309 Other abnormal glucose: Secondary | ICD-10-CM

## 2012-01-12 DIAGNOSIS — E785 Hyperlipidemia, unspecified: Secondary | ICD-10-CM

## 2012-01-12 DIAGNOSIS — I10 Essential (primary) hypertension: Secondary | ICD-10-CM

## 2012-01-12 LAB — LIPID PANEL
Cholesterol: 244 mg/dL — ABNORMAL HIGH (ref 0–200)
HDL: 64.4 mg/dL (ref >39.00)
VLDL: 36.6 mg/dL (ref 0.0–40.0)

## 2012-01-12 LAB — BASIC METABOLIC PANEL
CO2: 26 mEq/L (ref 19–32)
GFR: 138.71 mL/min (ref >60.00)
Potassium: 4.1 mEq/L (ref 3.5–5.1)
Sodium: 137 mEq/L (ref 135–145)

## 2012-01-12 LAB — HEPATIC FUNCTION PANEL
ALT: 22 U/L (ref 0–35)
AST: 26 U/L (ref 0–37)
Albumin: 2.7 g/dL — ABNORMAL LOW (ref 3.5–5.2)
Bilirubin, Direct: 0 mg/dL (ref 0.0–0.3)

## 2012-01-17 ENCOUNTER — Encounter: Payer: Self-pay | Admitting: Internal Medicine

## 2012-01-17 ENCOUNTER — Ambulatory Visit (INDEPENDENT_AMBULATORY_CARE_PROVIDER_SITE_OTHER): Payer: 59 | Admitting: Internal Medicine

## 2012-01-17 DIAGNOSIS — I10 Essential (primary) hypertension: Secondary | ICD-10-CM

## 2012-01-17 DIAGNOSIS — R7309 Other abnormal glucose: Secondary | ICD-10-CM

## 2012-01-17 DIAGNOSIS — E785 Hyperlipidemia, unspecified: Secondary | ICD-10-CM

## 2012-01-17 MED ORDER — LISINOPRIL 20 MG PO TABS: 20.0000 mg | ORAL_TABLET | Freq: Every day | ORAL | Status: DC

## 2012-01-17 MED ORDER — METFORMIN HCL 500 MG PO TABS: 500.0000 mg | ORAL_TABLET | Freq: Two times a day (BID) | ORAL | Status: DC

## 2012-01-17 MED ORDER — SIMVASTATIN 20 MG PO TABS: 20.0000 mg | ORAL_TABLET | Freq: Every day | ORAL | Status: DC

## 2012-01-17 NOTE — Assessment & Plan Note (Signed)
Restart simvastatin at 20 mg.  Goal LDL < 100

## 2012-01-17 NOTE — Assessment & Plan Note (Signed)
I stressed importance of compliance.  She understands higher risk of CV complications in patients with uncontrolled hypertension.  Restart lisinopril.  Pt advised to avoid OTC cold preps.

## 2012-01-17 NOTE — Assessment & Plan Note (Signed)
A1c worse.  Restart metformin 500 mg bid.

## 2012-01-17 NOTE — Progress Notes (Signed)
  Subjective:    Patient ID: Shelly Clark, female    DOB: Aug 06, 1946, 66 y.o.   MRN: [id number]  HPI  66 year old Asian female with history of hypertension, prediabetes and hyperlipidemia for routine followup. Unfortunately patient has decided for the last 6 months not to take any of her medications. He has noticed for the last several days her blood pressure has been significantly elevated. Coincidentally, she was recently seen by urgent care for URI she has been taking over-the-counter cold preps (NyQuil).  Her recent lab results were reviewed. Her A1c is worse at 6.7. She is not having uncontrolled hyperglycemia. She is still following a reasonable diet.  Review of Systems Negative for chest pain  Past Medical History  Diagnosis Date  . Hyperlipidemia   . Hypertension   . Osteoporosis   . Tobacco abuse   . Benign hematuria     neg urology workup  . Migraines     History   Social History  . Marital Status: Married    Spouse Name: N/A    Number of Children: N/A  . 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 Smoking status: Current Everyday Smoker -- 1.0 packs/day for 40 years    Types: Cigarettes  . Smokeless tobacco: Never Used  . Alcohol Use: No  . Drug Use: No  . Sexually Active: Not on file   Other Topics Concern  . Not on file   Social History Narrative  . No narrative on file    No past surgical history on file.  Family History  Problem Relation Age of Onset  . Cancer Mother     ovarian  . Diabetes Mother   . Hypertension Mother   . Hyperlipidemia Mother   . Diabetes Father   . Hypertension Father   . Hyperlipidemia Father     Allergies  Allergen Reactions  . Codeine     Current Outpatient Prescriptions on File Prior to Visit  Medication Sig Dispense Refill  . albuterol (PROAIR HFA) 108 (90 BASE) MCG/ACT inhaler Inhale 2 puffs into the lungs every 6 (six) hours as needed.  1  Inhaler  6  . zolpidem (AMBIEN) 5 MG tablet take 1 tablet by mouth at bedtime if needed for sleep  30 tablet  0    BP 172/100  Pulse 81  Temp(Src) 98.5 F (36.9 C) (Oral)  Wt 133 lb (60.328 kg)  SpO2 97%       Objective:   Physical Exam  Constitutional: She appears well-developed and well-nourished.  Cardiovascular: Normal rate, regular rhythm and normal heart sounds.   No murmur heard. Pulmonary/Chest: Effort normal and breath sounds normal. She has no wheezes. She has no rales.  Musculoskeletal: She exhibits no edema.  Skin: Skin is warm and dry.      Assessment & Plan:

## 2012-01-17 NOTE — Patient Instructions (Signed)
Use mucinex or mucinex DM for cough and congestion Stop using nyquil Use nasal saline spray twice daily

## 2012-02-14 ENCOUNTER — Ambulatory Visit (INDEPENDENT_AMBULATORY_CARE_PROVIDER_SITE_OTHER): Payer: 59 | Admitting: Internal Medicine

## 2012-02-14 ENCOUNTER — Encounter: Payer: Self-pay | Admitting: Internal Medicine

## 2012-02-14 VITALS — BP 172/90 | HR 88 | Temp 98.2°F | Wt 128.0 lb

## 2012-02-14 DIAGNOSIS — E785 Hyperlipidemia, unspecified: Secondary | ICD-10-CM

## 2012-02-14 DIAGNOSIS — I10 Essential (primary) hypertension: Secondary | ICD-10-CM

## 2012-02-14 MED ORDER — AMLODIPINE BESYLATE 5 MG PO TABS: 5.0000 mg | ORAL_TABLET | Freq: Every day | ORAL | Status: DC

## 2012-02-14 NOTE — Patient Instructions (Signed)
Please complete the following lab tests before your next follow up appointment: BMET - 401.9 

## 2012-02-14 NOTE — Assessment & Plan Note (Signed)
Uncontrolled despite restarting lisinopril at 20 mg. Amlodipine 5 mg once daily. Obtain renal artery Doppler to rule out RAS. BP: 172/90 mmHg  Lab Results  Component Value Date   CREATININE 0.5 01/12/2012

## 2012-02-14 NOTE — Assessment & Plan Note (Signed)
Reduce simvastatin dose due to interaction with amlodipine.

## 2012-02-14 NOTE — Progress Notes (Signed)
  Subjective:    Patient ID: Shelly Clark, female    DOB: 1946/03/14, 66 y.o.   MRN: [id number]  HPI  66 year old Asian female for followup regarding hypertension. Patient was not taking her blood pressure medication for several months and then recently restarted on her lisinopril. Despite taking her usual dose her blood pressure still elevated. She has intermittent headaches.  She denies use of herbal preparations or other over-the-counter medications.   Review of Systems    negative for chest pain or shortness of breath  Past Medical History  Diagnosis Date  . Hyperlipidemia   . Hypertension   . Osteoporosis   . Tobacco abuse   . Benign hematuria     neg urology workup  . Migraines     History   Social History  . Marital Status: Married    Spouse Name: N/A    Number of Children: N/A  . 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 Smoking status: Current Everyday Smoker -- 1.0 packs/day for 40 years    Types: Cigarettes  . Smokeless tobacco: Never Used  . Alcohol Use: No  . Drug Use: No  . Sexually Active: Not on file   Other Topics Concern  . Not on file   Social History Narrative  . No narrative on file    No past surgical history on file.  Family History  Problem Relation Age of Onset  . Cancer Mother     ovarian  . Diabetes Mother   . Hypertension Mother   . Hyperlipidemia Mother   . Diabetes Father   . Hypertension Father   . Hyperlipidemia Father     Allergies  Allergen Reactions  . Codeine     Current Outpatient Prescriptions on File Prior to Visit  Medication Sig Dispense Refill  . albuterol (PROAIR HFA) 108 (90 BASE) MCG/ACT inhaler Inhale 2 puffs into the lungs every 6 (six) hours as needed.  1 Inhaler  6  . lisinopril (PRINIVIL,ZESTRIL) 20 MG tablet Take 1 tablet (20 mg total) by mouth daily.  90 tablet  1  . metFORMIN (GLUCOPHAGE) 500 MG tablet Take 1 tablet (500 mg total)  by mouth 2 (two) times daily with a meal.  180 tablet  1    BP 172/90  Pulse 88  Temp(Src) 98.2 F (36.8 C) (Oral)  Wt 128 lb (58.06 kg)    Objective:   Physical Exam  Constitutional: She appears well-developed and well-nourished.  Cardiovascular: Normal rate, regular rhythm and normal heart sounds.   Pulmonary/Chest: Effort normal and breath sounds normal.  Abdominal: Soft. Bowel sounds are normal. There is no tenderness. There is no rebound.  Musculoskeletal: She exhibits no edema.          Assessment & Plan:

## 2012-02-28 ENCOUNTER — Other Ambulatory Visit: Payer: Self-pay | Admitting: Cardiology

## 2012-02-28 DIAGNOSIS — I10 Essential (primary) hypertension: Secondary | ICD-10-CM

## 2012-03-04 ENCOUNTER — Encounter: Payer: Self-pay | Admitting: Internal Medicine

## 2012-03-04 ENCOUNTER — Ambulatory Visit (INDEPENDENT_AMBULATORY_CARE_PROVIDER_SITE_OTHER): Payer: 59 | Admitting: Internal Medicine

## 2012-03-04 VITALS — BP 130/84 | HR 88 | Wt 128.0 lb

## 2012-03-04 DIAGNOSIS — R42 Dizziness and giddiness: Secondary | ICD-10-CM | POA: Insufficient documentation

## 2012-03-04 DIAGNOSIS — I10 Essential (primary) hypertension: Secondary | ICD-10-CM

## 2012-03-04 MED ORDER — DIAZEPAM 2 MG PO TABS: 2.0000 mg | ORAL_TABLET | Freq: Three times a day (TID) | ORAL | Status: AC | PRN

## 2012-03-04 NOTE — Progress Notes (Signed)
  Subjective:    Patient ID: Shelly Clark, female    DOB: October 09, 1946, 66 y.o.   MRN: [id number]  HPI  66 year old Asian female complains of severe vertigo over the last several days. Her symptoms are exacerbated with any kind of head movement. She complains of nausea but has not vomited. She has mild left earache.  Review of Systems Negative for fever or chills. Negative for headache. Negative for hearing loss.  Past Medical History  Diagnosis Date  . Hyperlipidemia   . Hypertension   . Osteoporosis   . Tobacco abuse   . Benign hematuria     neg urology workup  . Migraines     History   Social History  . Marital Status: Married    Spouse Name: N/A    Number of Children: N/A  . 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 Smoking status: Current Everyday Smoker -- 1.0 packs/day for 40 years    Types: Cigarettes  . Smokeless tobacco: Never Used  . Alcohol Use: No  . Drug Use: No  . Sexually Active: Not on file   Other Topics Concern  . Not on file   Social History Narrative  . No narrative on file    No past surgical history on file.  Family History  Problem Relation Age of Onset  . Cancer Mother     ovarian  . Diabetes Mother   . Hypertension Mother   . Hyperlipidemia Mother   . Diabetes Father   . Hypertension Father   . Hyperlipidemia Father     Allergies  Allergen Reactions  . Codeine     Current Outpatient Prescriptions on File Prior to Visit  Medication Sig Dispense Refill  . albuterol (PROAIR HFA) 108 (90 BASE) MCG/ACT inhaler Inhale 2 puffs into the lungs every 6 (six) hours as needed.  1 Inhaler  6  . amLODipine (NORVASC) 5 MG tablet Take 1 tablet (5 mg total) by mouth daily.  30 tablet  3  . lisinopril (PRINIVIL,ZESTRIL) 20 MG tablet Take 1 tablet (20 mg total) by mouth daily.  90 tablet  1  . metFORMIN (GLUCOPHAGE) 500 MG tablet Take 1 tablet (500 mg total) by mouth 2 (two) times  daily with a meal.  180 tablet  1  . simvastatin (ZOCOR) 20 MG tablet Take 0.5 tablets (10 mg total) by mouth daily.  90 tablet  1    BP 130/84  Pulse 88  Wt 128 lb (58.06 kg)  No orthostasis.  BP same sitting and standing.    Objective:   Physical Exam  Constitutional: She is oriented to person, place, and time. She appears well-developed and well-nourished.  HENT:  Head: Normocephalic and atraumatic.  Right Ear: External ear normal.  Left Ear: External ear normal.  Cardiovascular: Normal rate, regular rhythm and normal heart sounds.   Pulmonary/Chest: Effort normal and breath sounds normal. She has no wheezes. She has no rales.  Neurological: She is alert and oriented to person, place, and time. No cranial nerve deficit.  Skin: Skin is warm and dry.  Psychiatric: She has a normal mood and affect. Her behavior is normal.      Assessment & Plan:

## 2012-03-04 NOTE — Patient Instructions (Signed)
Please call our office if your symptoms do not improve or gets worse.  

## 2012-03-04 NOTE — Assessment & Plan Note (Signed)
A 66 year old Asian female with probable benign positional vertigo. Use diazepam 2 mg 3 times a day as needed. Refer to vestibular rehabilitation.

## 2012-03-04 NOTE — Assessment & Plan Note (Signed)
Improved with addition of amlodipine.

## 2012-03-05 ENCOUNTER — Ambulatory Visit: Payer: 59 | Attending: Internal Medicine | Admitting: Physical Therapy

## 2012-03-05 DIAGNOSIS — IMO0001 Reserved for inherently not codable concepts without codable children: Secondary | ICD-10-CM | POA: Insufficient documentation

## 2012-03-05 DIAGNOSIS — R42 Dizziness and giddiness: Secondary | ICD-10-CM | POA: Insufficient documentation

## 2012-03-05 DIAGNOSIS — R269 Unspecified abnormalities of gait and mobility: Secondary | ICD-10-CM | POA: Insufficient documentation

## 2012-03-06 ENCOUNTER — Encounter (INDEPENDENT_AMBULATORY_CARE_PROVIDER_SITE_OTHER): Payer: 59

## 2012-03-06 DIAGNOSIS — I10 Essential (primary) hypertension: Secondary | ICD-10-CM

## 2012-03-20 ENCOUNTER — Other Ambulatory Visit: Payer: 59

## 2012-03-27 ENCOUNTER — Ambulatory Visit: Payer: 59 | Admitting: Internal Medicine

## 2012-03-29 ENCOUNTER — Other Ambulatory Visit (INDEPENDENT_AMBULATORY_CARE_PROVIDER_SITE_OTHER): Payer: 59

## 2012-03-29 DIAGNOSIS — I10 Essential (primary) hypertension: Secondary | ICD-10-CM

## 2012-03-29 LAB — BASIC METABOLIC PANEL
CO2: 24 mEq/L (ref 19–32)
Calcium: 8.9 mg/dL (ref 8.4–10.5)
Chloride: 107 mEq/L (ref 96–112)
Creatinine, Ser: 0.4 mg/dL (ref 0.4–1.2)
Glucose, Bld: 98 mg/dL (ref 70–99)
Sodium: 140 mEq/L (ref 135–145)

## 2012-04-05 ENCOUNTER — Ambulatory Visit (INDEPENDENT_AMBULATORY_CARE_PROVIDER_SITE_OTHER): Payer: 59 | Admitting: Internal Medicine

## 2012-04-05 ENCOUNTER — Encounter: Payer: Self-pay | Admitting: Internal Medicine

## 2012-04-05 VITALS — BP 150/90 | HR 90 | Temp 98.1°F | Wt 128.0 lb

## 2012-04-05 DIAGNOSIS — R42 Dizziness and giddiness: Secondary | ICD-10-CM

## 2012-04-05 DIAGNOSIS — I10 Essential (primary) hypertension: Secondary | ICD-10-CM

## 2012-04-05 MED ORDER — LISINOPRIL 40 MG PO TABS: 40.0000 mg | ORAL_TABLET | Freq: Every day | ORAL | Status: DC

## 2012-04-05 NOTE — Progress Notes (Signed)
  Subjective:    Patient ID: Shelly Clark, female    DOB: 1946/01/23, 66 y.o.   MRN: [id number]  HPI  66 year old Asian female previously seen for vertigo for followup. Since completing the vestibular rehabilitation her symptoms have significantly improved. She was given home exercises and contact information in the event her symptoms recur in the future.  Hypertension-good compliance with lisinopril and amlodipine. She has been monitoring home blood pressure readings. Systolic blood pressure readings are still elevated between 140 and 150.   Review of Systems  Negative for chest pain or shortness of breath  Past Medical History  Diagnosis Date  . Hyperlipidemia   . Hypertension   . Osteoporosis   . Tobacco abuse   . Benign hematuria     neg urology workup  . Migraines     History   Social History  . Marital Status: Married    Spouse Name: N/A    Number of Children: N/A  . 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 Smoking status: Current Everyday Smoker -- 1.0 packs/day for 40 years    Types: Cigarettes  . Smokeless tobacco: Never Used  . Alcohol Use: No  . Drug Use: No  . Sexually Active: Not on file   Other Topics Concern  . Not on file   Social History Narrative  . No narrative on file    No past surgical history on file.  Family History  Problem Relation Age of Onset  . Cancer Mother     ovarian  . Diabetes Mother   . Hypertension Mother   . Hyperlipidemia Mother   . Diabetes Father   . Hypertension Father   . Hyperlipidemia Father     Allergies  Allergen Reactions  . Codeine     Current Outpatient Prescriptions on File Prior to Visit  Medication Sig Dispense Refill  . amLODipine (NORVASC) 5 MG tablet Take 1 tablet (5 mg total) by mouth daily.  30 tablet  3  . lisinopril (PRINIVIL,ZESTRIL) 20 MG tablet Take 1 tablet (20 mg total) by mouth daily.  90 tablet  1  . metFORMIN  (GLUCOPHAGE) 500 MG tablet Take 1 tablet (500 mg total) by mouth 2 (two) times daily with a meal.  180 tablet  1  . simvastatin (ZOCOR) 20 MG tablet Take 0.5 tablets (10 mg total) by mouth daily.  90 tablet  1    BP 150/90  Pulse 90  Temp(Src) 98.1 F (36.7 C) (Oral)  Wt 128 lb (58.06 kg)       Objective:   Physical Exam  Constitutional: She appears well-developed and well-nourished. No distress.  Cardiovascular: Normal rate, regular rhythm and normal heart sounds.   Pulmonary/Chest: Effort normal. She has wheezes. She has no rales.  Skin: Skin is warm and dry.       Assessment & Plan:

## 2012-04-05 NOTE — Assessment & Plan Note (Signed)
Still elevated.  Renal artery Doppler negative for renal artery stenosis. Increase lisinopril to 40 mg. Arrange BMET.  Patient to call office with home BP readings within 2 - 4 weeks.

## 2012-04-05 NOTE — Patient Instructions (Signed)
Call our office with your blood pressure readings within 2-4 weeks Please complete the following lab test within 1 month: BMET - 401.9

## 2012-04-05 NOTE — Assessment & Plan Note (Signed)
Resolved with vestibular rehab. Her symptoms consistent with BPV.

## 2012-05-03 ENCOUNTER — Ambulatory Visit: Payer: 59 | Admitting: Internal Medicine

## 2012-07-19 ENCOUNTER — Other Ambulatory Visit: Payer: Self-pay | Admitting: Internal Medicine

## 2012-07-19 MED ORDER — AMLODIPINE BESYLATE 5 MG PO TABS: 5.0000 mg | ORAL_TABLET | Freq: Every day | ORAL | Status: DC

## 2012-08-16 ENCOUNTER — Other Ambulatory Visit: Payer: Self-pay | Admitting: Internal Medicine

## 2012-08-27 ENCOUNTER — Encounter (HOSPITAL_COMMUNITY): Payer: Self-pay

## 2012-08-27 ENCOUNTER — Emergency Department (HOSPITAL_COMMUNITY): Payer: 59

## 2012-08-27 ENCOUNTER — Telehealth: Payer: Self-pay | Admitting: Internal Medicine

## 2012-08-27 ENCOUNTER — Emergency Department (HOSPITAL_COMMUNITY)
Admission: EM | Admit: 2012-08-27 | Discharge: 2012-08-27 | Disposition: A | Discharge status: Home / Self Care | Payer: 59 | Attending: Emergency Medicine | Admitting: Emergency Medicine

## 2012-08-27 DIAGNOSIS — M810 Age-related osteoporosis without current pathological fracture: Secondary | ICD-10-CM | POA: Insufficient documentation

## 2012-08-27 DIAGNOSIS — E785 Hyperlipidemia, unspecified: Secondary | ICD-10-CM | POA: Insufficient documentation

## 2012-08-27 DIAGNOSIS — Z79899 Other long term (current) drug therapy: Secondary | ICD-10-CM | POA: Insufficient documentation

## 2012-08-27 DIAGNOSIS — M79609 Pain in unspecified limb: Secondary | ICD-10-CM

## 2012-08-27 DIAGNOSIS — M25569 Pain in unspecified knee: Secondary | ICD-10-CM | POA: Insufficient documentation

## 2012-08-27 DIAGNOSIS — I10 Essential (primary) hypertension: Secondary | ICD-10-CM | POA: Insufficient documentation

## 2012-08-27 DIAGNOSIS — M79605 Pain in left leg: Secondary | ICD-10-CM

## 2012-08-27 DIAGNOSIS — Z7982 Long term (current) use of aspirin: Secondary | ICD-10-CM | POA: Insufficient documentation

## 2012-08-27 DIAGNOSIS — F17200 Nicotine dependence, unspecified, uncomplicated: Secondary | ICD-10-CM | POA: Insufficient documentation

## 2012-08-27 MED ORDER — TRAMADOL HCL 50 MG PO TABS: 50.0000 mg | ORAL_TABLET | Freq: Four times a day (QID) | ORAL | Status: DC | PRN

## 2012-08-27 MED ORDER — TRAMADOL HCL 50 MG PO TABS
50.0000 mg | ORAL_TABLET | Freq: Once | ORAL | Status: AC
  Administered 2012-08-27: 50 mg via ORAL
  Filled 2012-08-27: qty 1

## 2012-08-27 NOTE — ED Notes (Signed)
Patient transported to X-ray 

## 2012-08-27 NOTE — Progress Notes (Signed)
*  PRELIMINARY RESULTS* Vascular Ultrasound Left lower extremity venous duplex has been completed.  Preliminary findings: no evidence of DVT or baker's cyst.  Farrel Demark, RDMS, RVT  08/27/2012, 12:38 PM

## 2012-08-27 NOTE — ED Notes (Signed)
Pt presents with no acute distress- c/o of left knee pain x 1 week with increased pain today- Increased pain just behind knee- positive homan's sign- good CMS- pulses palpable

## 2012-08-27 NOTE — ED Provider Notes (Signed)
Medical screening examination/treatment/procedure(s) were performed by non-physician practitioner and as supervising physician I was immediately available for consultation/collaboration.  Konrad Hoak, MD 08/27/12 1632 

## 2012-08-27 NOTE — ED Provider Notes (Signed)
History     CSN: [id number]  Arrival date & time 08/27/12  1029   First MD Initiated Contact with Patient 08/27/12 1055      Chief Complaint  Patient presents with  . Knee Pain    (Consider location/radiation/quality/duration/timing/severity/associated sxs/prior treatment) HPI Comments: Patient reports pain in her posterior left knee that radiates into her tight and upper calf that began approximately one week ago.  Pain is sharp, exacerbated by standing and flexing knee.  States he did not have any preceding injury and has never had problems with this knee before.  States that today she was walking the dog and twisted the knee, causing her pain to get worse.  Denies fevers, CP, SOB, leg swelling.  Denies back pain or hip pain.  Denies any recent immobilization, and personal or family hx blood clots.    Patient is a 66 y.o. female presenting with knee pain. The history is provided by the patient and a relative.  Knee Pain Pertinent negatives include no chest pain, chills or fever.    Past Medical History  Diagnosis Date  . Hyperlipidemia   . Hypertension   . Osteoporosis   . Tobacco abuse   . Benign hematuria     neg urology workup  . Migraines     History reviewed. No pertinent past surgical history.  Family History  Problem Relation Age of Onset  . Cancer Mother     ovarian  . Diabetes Mother   . Hypertension Mother   . Hyperlipidemia Mother   . Diabetes Father   . Hypertension Father   . Hyperlipidemia Father     History  Substance Use Topics  . Smoking status: Current Every Day Smoker -- 1.0 packs/day for 40 years    Types: Cigarettes  . Smokeless tobacco: Never Used  . Alcohol Use: No    OB History    Grav Para Term Preterm Abortions TAB SAB Ect Mult Living                  Review of Systems  Constitutional: Negative for fever and chills.  Respiratory: Negative for shortness of breath.   Cardiovascular: Negative for chest pain.  Musculoskeletal:  Negative for back pain.    Allergies  Codeine  Home Medications   Current Outpatient Rx  Name Route Sig Dispense Refill  . AMLODIPINE BESYLATE 5 MG PO TABS Oral Take 5 mg by mouth daily.    . ASPIRIN EC 81 MG PO TBEC Oral Take 81 mg by mouth daily.    . B COMPLEX-C PO TABS Oral Take 1 tablet by mouth daily.    . IBUPROFEN 200 MG PO TABS Oral Take 400-600 mg by mouth every 8 (eight) hours as needed. For pain.    Marland Kitchen LISINOPRIL 40 MG PO TABS Oral Take 40 mg by mouth daily.    Marland Kitchen METFORMIN HCL 500 MG PO TABS Oral Take 500 mg by mouth 2 (two) times daily with a meal.    . ADULT MULTIVITAMIN W/MINERALS CH Oral Take 1 tablet by mouth daily.    Marland Kitchen SIMVASTATIN 20 MG PO TABS Oral Take 20 mg by mouth every evening.      BP 147/75  Pulse 75  Temp 98.1 F (36.7 C) (Oral)  Resp 20  Ht 4\' 11"  (1.499 m)  Wt 124 lb (56.246 kg)  BMI 25.04 kg/m2  SpO2 95%  Physical Exam  Nursing note and vitals reviewed. Constitutional: She appears well-developed and well-nourished. No distress.  HENT:  Head: Normocephalic and atraumatic.  Neck: Neck supple.  Pulmonary/Chest: Effort normal.  Musculoskeletal:       Left hip: Normal.       Left knee: She exhibits no swelling, no effusion, no ecchymosis, no deformity, no laceration, no erythema, normal alignment, no LCL laxity, no bony tenderness and no MCL laxity. tenderness found. No medial joint line, no lateral joint line, no MCL, no LCL and no patellar tendon tenderness noted.       Left ankle: no tenderness.       Cervical back: She exhibits no bony tenderness.       Thoracic back: She exhibits no bony tenderness.       Lumbar back: She exhibits no bony tenderness.       Legs:      Left knee with posterior tenderness, left upper calf also with tenderness.  Distal pulses intact, sensation intact.    Neurological: She is alert.  Skin: She is not diaphoretic.    ED Course  Procedures (including critical care time)  Labs Reviewed - No data to  display Dg Knee Complete 4 Views Left  08/27/2012  *RADIOLOGY REPORT*  Clinical Data: Left knee pain.  LEFT KNEE - COMPLETE 4+ VIEW  Comparison: None.  Findings: Left knee is located.  No acute bone or soft tissue abnormalities present.  There is no significant effusion.  IMPRESSION: Negative left knee.   Original Report Authenticated By: Jamesetta Orleans. MATTERN, M.D.     12:39 PM Doppler is negative for baker's cyst and DVT.    1:29 PM Pt reports she has had pain relief.  She is able to touch her leg and bend it more easily.    1. Left leg pain     MDM  Pt with one week of left posterior leg pain without injury.  Pt states she has been walking a lot recently.  Doppler is negative for DVT and baker's cyst.  Pt's knee exam is unremarkable, xray is normal.  Suspect muscular pain.  Pt to be d/c home with PCP follow up, short course of pain medication.  Discussed all results with patient.  Pt given return precautions.  Pt verbalizes understanding and agrees with plan.          [place], [place] 08/27/12 1528

## 2012-08-27 NOTE — Telephone Encounter (Signed)
Pts daughter call and sch pt ov for tomorrow at 2:30 with Dr Artist Pais re: er fup for muscular and skeletal pain. Daughter is wondering if Dr Artist Pais could call in a muscle relaxer for pt to get her through the night. Pts pharmacy is [facility] [address] Veterinary surgeon.

## 2012-08-27 NOTE — Telephone Encounter (Signed)
Cindy, please call in tramadol 50 mg # 30 - one po bid prn for leg pain  No RF

## 2012-08-27 NOTE — Telephone Encounter (Signed)
Looks like this was already done by Trixie Dredge, PA

## 2012-08-28 ENCOUNTER — Ambulatory Visit (INDEPENDENT_AMBULATORY_CARE_PROVIDER_SITE_OTHER): Payer: 59 | Admitting: Internal Medicine

## 2012-08-28 VITALS — BP 150/84 | HR 99 | Temp 97.9°F

## 2012-08-28 DIAGNOSIS — M25569 Pain in unspecified knee: Secondary | ICD-10-CM

## 2012-08-28 DIAGNOSIS — M25561 Pain in right knee: Secondary | ICD-10-CM | POA: Insufficient documentation

## 2012-08-28 NOTE — Patient Instructions (Signed)
Use cool compress to right knee as directed. Please call our office if your symptoms do not improve or gets worse.

## 2012-08-28 NOTE — Assessment & Plan Note (Addendum)
66 year old Asian female with knee pain x 3 weeks. She was recently seen in emergency room for acute right knee pain. Her x-rays were negative. Venous Doppler was negative for blood clot. She has medial joint line tenderness and pain in her popliteal fossa. Patient may have meniscal tear. She has positive Apley's compression test. We discussed risks and benefits of joint injection. Patient agrees to proceed. Area prepped with Betadine and sterile technique maintained. 2 ml of half Depo-Medrol and 1% lidocaine injected into right knee. No complications. Patient advised to apply cool compress to 3 times a day patient also to use knee brace to minimize lateral motion. She already has crutches. Reassess in 2 weeks. If persistent pain we discussed referral to Dr. Merlyn Albert for further evaluation and treatment.  Reassess in 2 weeks.

## 2012-08-28 NOTE — Progress Notes (Signed)
Subjective:    Patient ID: Shelly Clark, female    DOB: 12-17-1945, 66 y.o.   MRN: [id number]  HPI  66 year old Asian female with history of hypertension and mild type 2 diabetes for ER follow up. Patient reports worsening knee pain over the last 2-3 weeks. If her symptoms started after she was [facility] a local animal hospital. She was restraining 4 dogs and twisted her knee. Her symptoms progressively worsened and  she was seen on October 1st in the emergency room for acute pain. X-rays of her right knee were negative. She had pain in her popliteal fossa so a venous Doppler was obtained. This was negative for DVT. She has been using a knee brace and using crutches. Weightbearing makes her right knee pain worse. Her pain has been improving over the last 24 hours.  Review of Systems    negative for fever or chills,  No redness or swelling of right knee  Past Medical History  Diagnosis Date  . Hyperlipidemia   . Hypertension   . Osteoporosis   . Tobacco abuse   . Benign hematuria     neg urology workup  . Migraines     History   Social History  . Marital Status: Married    Spouse Name: N/A    Number of Children: N/A  . 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 Smoking status: Current Every Day Smoker -- 1.0 packs/day for 40 years    Types: Cigarettes  . Smokeless tobacco: Never Used  . Alcohol Use: No  . Drug Use: No  . Sexually Active: Not on file   Other Topics Concern  . Not on file   Social History Narrative  . No narrative on file    No past surgical history on file.  Family History  Problem Relation Age of Onset  . Cancer Mother     ovarian  . Diabetes Mother   . Hypertension Mother   . Hyperlipidemia Mother   . Diabetes Father   . Hypertension Father   . Hyperlipidemia Father     Allergies  Allergen Reactions  . Codeine     Current Outpatient Prescriptions on File Prior to Visit    Medication Sig Dispense Refill  . amLODipine (NORVASC) 5 MG tablet Take 5 mg by mouth daily.      Marland Kitchen aspirin EC 81 MG tablet Take 81 mg by mouth daily.      . B Complex-C (B-COMPLEX WITH VITAMIN C) tablet Take 1 tablet by mouth daily.      Marland Kitchen ibuprofen (ADVIL,MOTRIN) 200 MG tablet Take 400-600 mg by mouth every 8 (eight) hours as needed. For pain.      Marland Kitchen lisinopril (PRINIVIL,ZESTRIL) 40 MG tablet Take 40 mg by mouth daily.      . metFORMIN (GLUCOPHAGE) 500 MG tablet Take 500 mg by mouth 2 (two) times daily with a meal.      . Multiple Vitamin (MULTIVITAMIN WITH MINERALS) TABS Take 1 tablet by mouth daily.      . simvastatin (ZOCOR) 20 MG tablet Take 20 mg by mouth every evening.      . traMADol (ULTRAM) 50 MG tablet Take 1 tablet (50 mg total) by mouth every 6 (six) hours as needed for pain.  10 tablet  0    BP 150/84  Pulse 99  Temp 97.9 F (36.6 C) (Oral)  SpO2 96%  Objective:   Physical Exam  Constitutional: She appears well-developed and well-nourished.  Cardiovascular: Normal rate, regular rhythm and normal heart sounds.   Pulmonary/Chest: Effort normal and breath sounds normal. She has no wheezes.  Musculoskeletal: Normal range of motion. She exhibits no edema.       Right knee:  No joint swelling or redness, knee joint is stable, medial joint line tenderness, positive Apley's compression test  No pain with ROM Ambulates with cane        Assessment & Plan:

## 2012-09-03 ENCOUNTER — Other Ambulatory Visit: Payer: Self-pay | Admitting: *Deleted

## 2012-09-03 MED ORDER — TRAMADOL HCL 50 MG PO TABS: 50.0000 mg | ORAL_TABLET | Freq: Four times a day (QID) | ORAL | Status: DC | PRN

## 2012-09-11 ENCOUNTER — Ambulatory Visit: Payer: 59 | Admitting: Internal Medicine

## 2012-09-16 ENCOUNTER — Ambulatory Visit: Payer: 59 | Admitting: Internal Medicine

## 2012-09-17 ENCOUNTER — Ambulatory Visit (INDEPENDENT_AMBULATORY_CARE_PROVIDER_SITE_OTHER): Payer: 59 | Admitting: Internal Medicine

## 2012-09-17 ENCOUNTER — Encounter: Payer: Self-pay | Admitting: Internal Medicine

## 2012-09-17 VITALS — BP 160/70 | HR 92 | Temp 98.8°F | Wt 127.0 lb

## 2012-09-17 DIAGNOSIS — I10 Essential (primary) hypertension: Secondary | ICD-10-CM

## 2012-09-17 DIAGNOSIS — M25561 Pain in right knee: Secondary | ICD-10-CM

## 2012-09-17 DIAGNOSIS — M25569 Pain in unspecified knee: Secondary | ICD-10-CM

## 2012-09-17 MED ORDER — DICLOFENAC SODIUM 1.5 % TD SOLN: TRANSDERMAL | Status: DC

## 2012-09-17 MED ORDER — HYDROCODONE-ACETAMINOPHEN 5-500 MG PO TABS: 1.0000 | ORAL_TABLET | Freq: Three times a day (TID) | ORAL | Status: DC | PRN

## 2012-09-17 MED ORDER — AMLODIPINE BESYLATE 10 MG PO TABS: 10.0000 mg | ORAL_TABLET | Freq: Every day | ORAL | Status: DC

## 2012-09-17 NOTE — Progress Notes (Signed)
  Subjective:    Patient ID: Shelly Clark, female    DOB: [birth year], 66 y.o.   MRN: [id number]  HPI  66 y/o Asian female for follow up re: right knee pain.  Her discomfort improved but not resolved.  She has medial joint tenderness.  No residual swelling.  No redness.  Mild pain with weight bearing.   Hypertension - BP elevated despite current dose of medication.  She has been taking ibuprofen for knee pain as needed.   Review of Systems See HPI  Past Medical History  Diagnosis Date  . Hyperlipidemia   . Hypertension   . Osteoporosis   . Tobacco abuse   . Benign hematuria     neg urology workup  . Migraines     History   Social History  . Marital Status: Married    Spouse Name: N/A    Number of Children: N/A  . 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 Smoking status: Current Every Day Smoker -- 1.0 packs/day for 40 years    Types: Cigarettes  . Smokeless tobacco: Never Used  . Alcohol Use: No  . Drug Use: No  . Sexually Active: Not on file   Other Topics Concern  . Not on file   Social History Narrative  . No narrative on file    No past surgical history on file.  Family History  Problem Relation Age of Onset  . Cancer Mother     ovarian  . Diabetes Mother   . Hypertension Mother   . Hyperlipidemia Mother   . Diabetes Father   . Hypertension Father   . Hyperlipidemia Father     Allergies  Allergen Reactions  . Codeine     Current Outpatient Prescriptions on File Prior to Visit  Medication Sig Dispense Refill  . aspirin EC 81 MG tablet Take 81 mg by mouth daily.      . B Complex-C (B-COMPLEX WITH VITAMIN C) tablet Take 1 tablet by mouth daily.      Marland Kitchen lisinopril (PRINIVIL,ZESTRIL) 40 MG tablet Take 40 mg by mouth daily.      . metFORMIN (GLUCOPHAGE) 500 MG tablet Take 500 mg by mouth 2 (two) times daily with a meal.      . Multiple Vitamin (MULTIVITAMIN WITH MINERALS) TABS Take 1  tablet by mouth daily.      . simvastatin (ZOCOR) 20 MG tablet Take 20 mg by mouth every evening.        There were no vitals taken for this visit.       Objective:   Physical Exam  Constitutional: She appears well-developed and well-nourished.  Cardiovascular: Normal rate, regular rhythm and normal heart sounds.   Pulmonary/Chest: Effort normal and breath sounds normal. She has no wheezes.  Musculoskeletal:       Tenderness below and slightly lateral to right medial joint line (knee) Knee joint is stable.  Psychiatric: She has a normal mood and affect. Her behavior is normal.          Assessment & Plan:

## 2012-09-17 NOTE — Patient Instructions (Signed)
Please complete the following lab tests before your next follow up appointment: BMET - 401.9 

## 2012-09-17 NOTE — Assessment & Plan Note (Signed)
Improved.  She has residual mild discomfort medial aspect of right knee.  I suspect she strained her MCL.  Continue to use knee brace for additional 2-4 weeks.  DC ibuprofen due to potential interaction with ACE.  Use Pennsaid gtts to right knee as directed.  If persistent pain, we discussed referral to ortho.

## 2012-09-17 NOTE — Assessment & Plan Note (Addendum)
BP not at goal.  Increase amlodipine to 10 mg.

## 2012-09-18 ENCOUNTER — Ambulatory Visit: Payer: 59 | Admitting: Internal Medicine

## 2012-09-27 ENCOUNTER — Telehealth: Payer: Self-pay | Admitting: Family Medicine

## 2012-09-27 MED ORDER — DICLOFENAC SODIUM 1 % TD GEL: 2.0000 g | Freq: Four times a day (QID) | TRANSDERMAL | Status: DC

## 2012-09-27 NOTE — Telephone Encounter (Signed)
Pt's prior auth for Pennsaid is denied. Must try Voltaren first, unless not possible. [facility] [address]. Please advise. Thank you.

## 2012-09-27 NOTE — Telephone Encounter (Signed)
Change to voltaren gel  Apply 2 grams 4 x daily to affected knee.  5 tubes.  1 RF

## 2012-09-30 ENCOUNTER — Other Ambulatory Visit: Payer: 59

## 2012-10-18 ENCOUNTER — Ambulatory Visit: Payer: 59 | Admitting: Internal Medicine

## 2012-11-17 ENCOUNTER — Other Ambulatory Visit: Payer: Self-pay | Admitting: Internal Medicine

## 2013-01-02 ENCOUNTER — Other Ambulatory Visit: Payer: Self-pay | Admitting: Internal Medicine

## 2013-02-06 IMAGING — CR DG KNEE COMPLETE 4+V*L*
4 series · 4 of 4 positions shown · non-contrast
Comparison: None.

CLINICAL DATA: Left knee pain.

LEFT KNEE - COMPLETE 4+ VIEW

[t knee ap left]
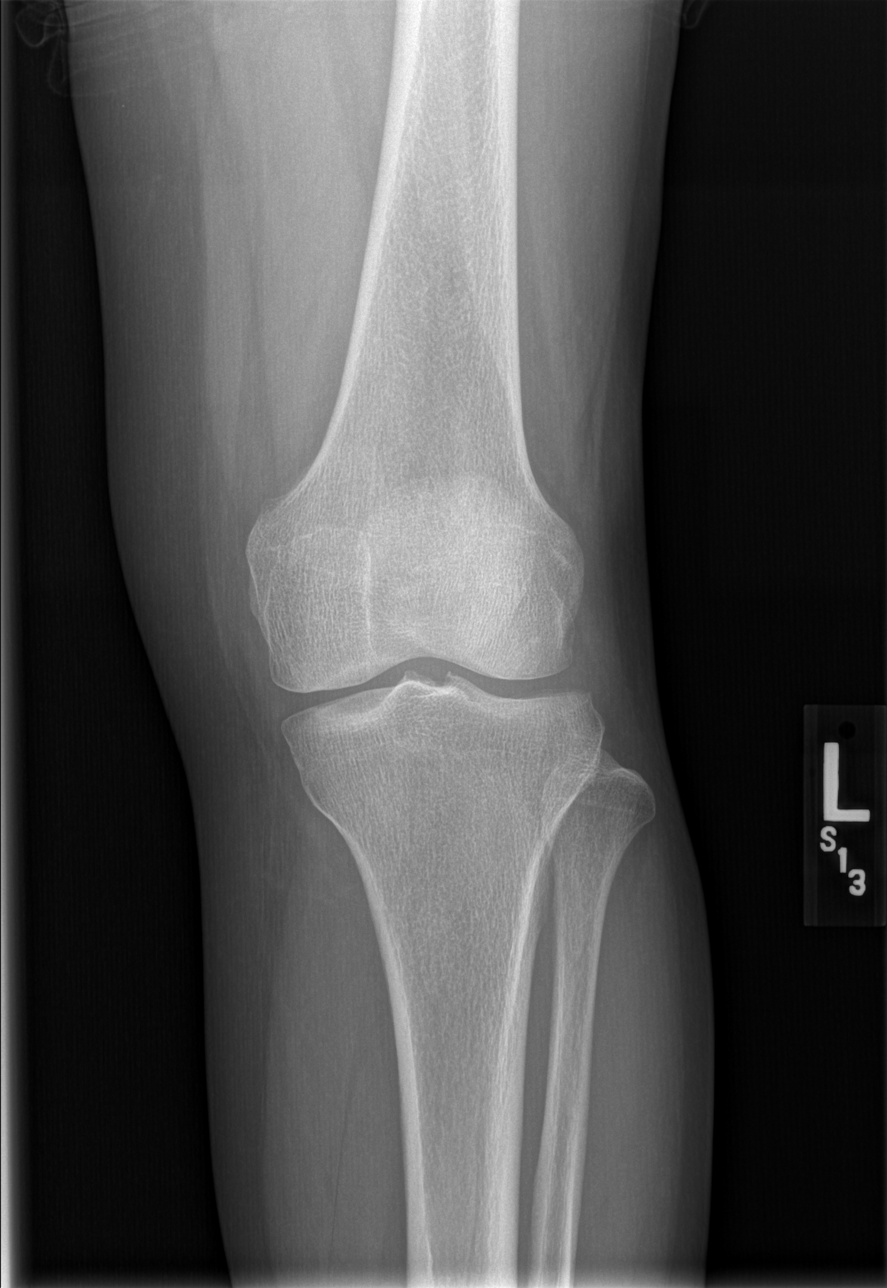

[t knee obl left (1 of 2)]
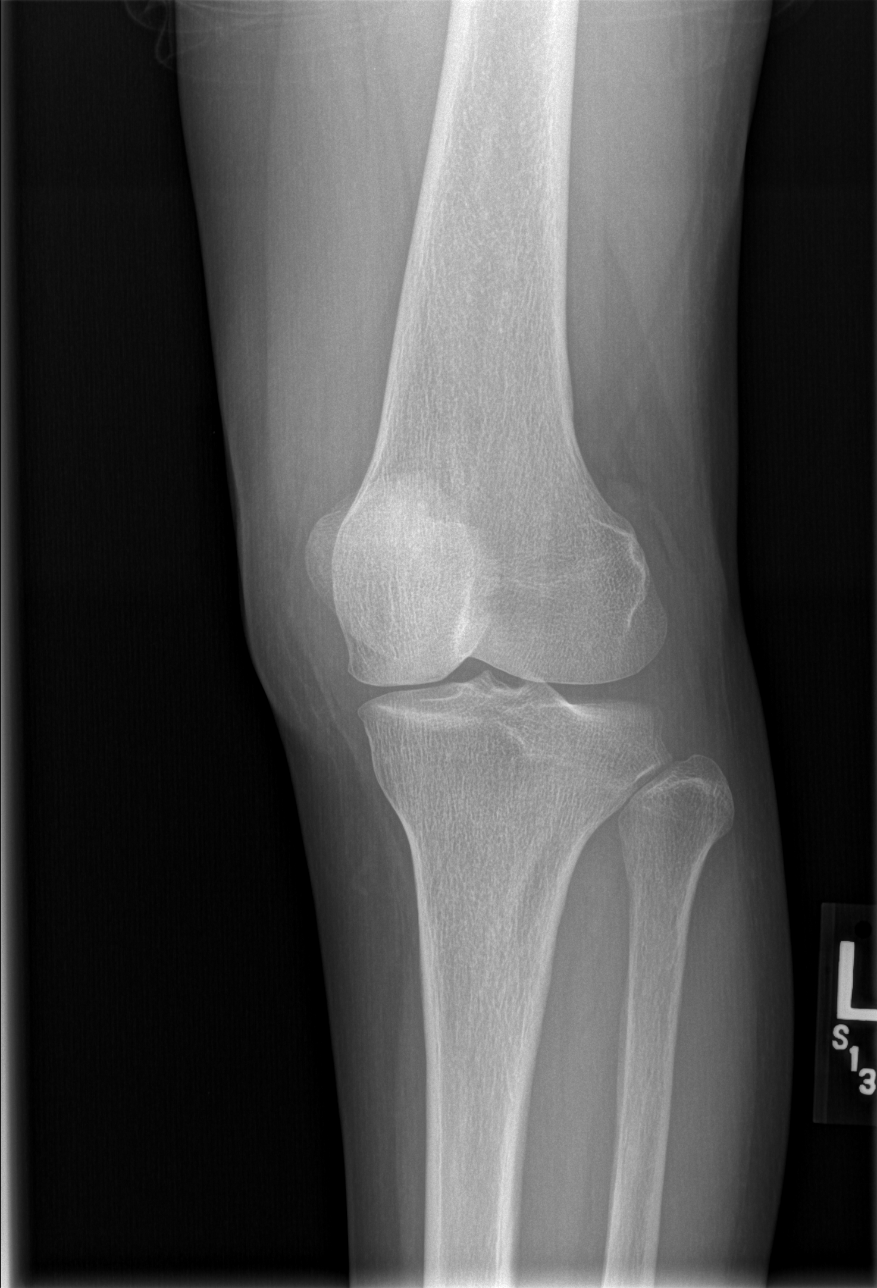

[t knee obl left (2 of 2)]
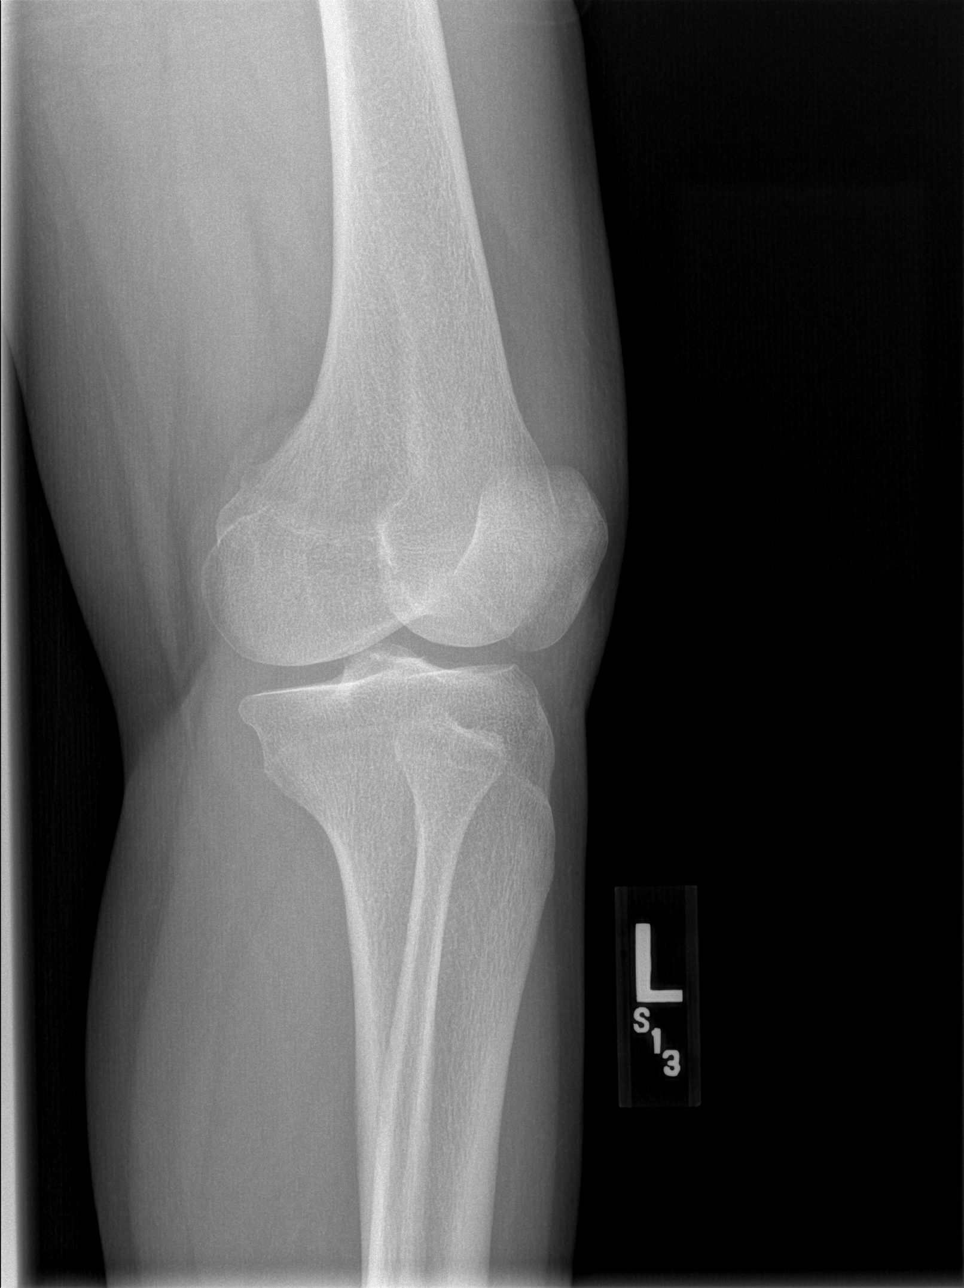

[t knee lat left]
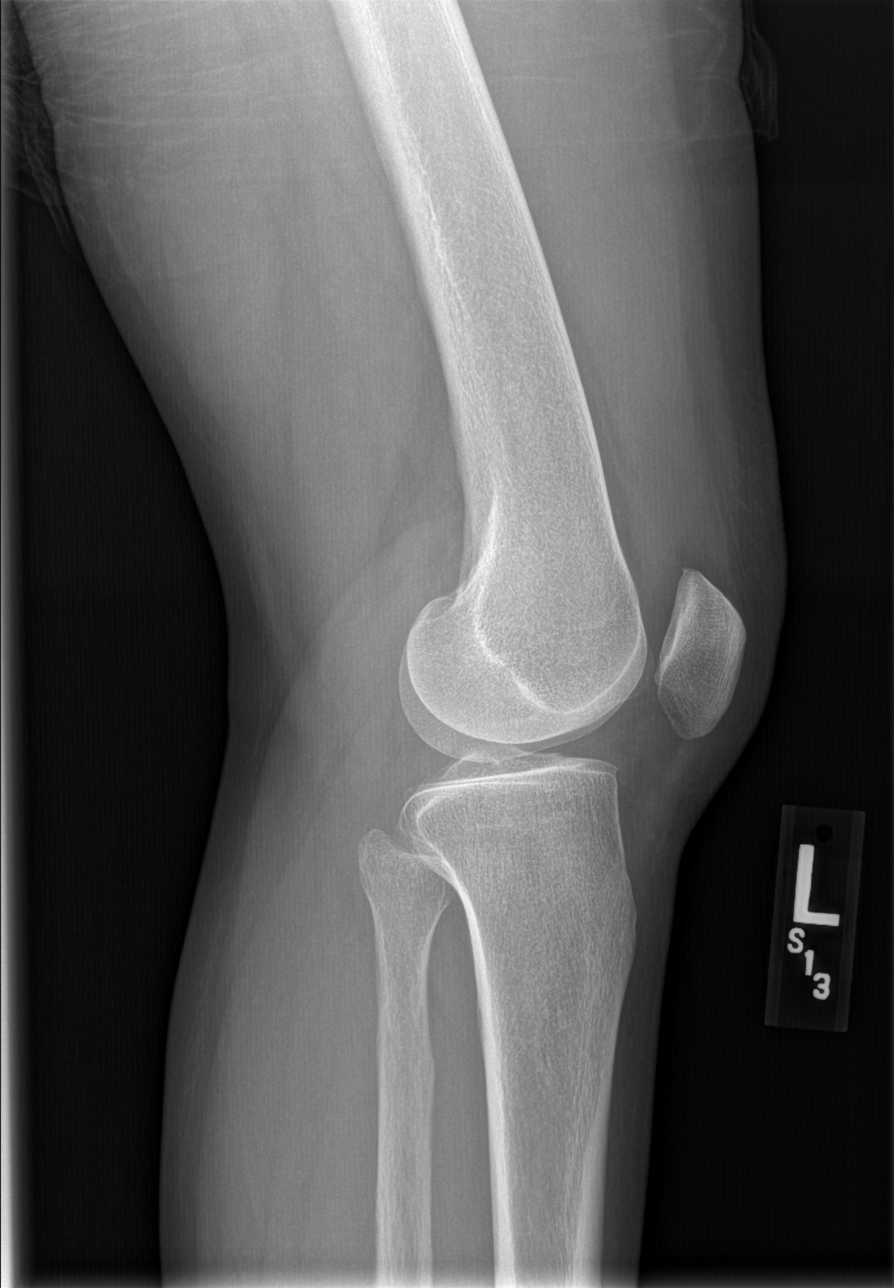

[4 of 4 positions shown; findings below may reference images not displayed]

FINDINGS: Left knee is located.  No acute bone or soft tissue
abnormalities present.  There is no significant effusion.
IMPRESSION: Negative left knee.

## 2013-02-10 ENCOUNTER — Ambulatory Visit (INDEPENDENT_AMBULATORY_CARE_PROVIDER_SITE_OTHER): Payer: 59 | Admitting: Internal Medicine

## 2013-02-10 ENCOUNTER — Encounter: Payer: Self-pay | Admitting: Internal Medicine

## 2013-02-10 VITALS — BP 144/82 | HR 112 | Temp 98.4°F | Wt 129.0 lb

## 2013-02-10 DIAGNOSIS — F4323 Adjustment disorder with mixed anxiety and depressed mood: Secondary | ICD-10-CM

## 2013-02-10 DIAGNOSIS — I10 Essential (primary) hypertension: Secondary | ICD-10-CM

## 2013-02-10 DIAGNOSIS — R7309 Other abnormal glucose: Secondary | ICD-10-CM

## 2013-02-10 DIAGNOSIS — E785 Hyperlipidemia, unspecified: Secondary | ICD-10-CM

## 2013-02-10 MED ORDER — ZOLPIDEM TARTRATE 10 MG PO TABS: ORAL_TABLET | ORAL | Status: DC

## 2013-02-10 MED ORDER — METFORMIN HCL 500 MG PO TABS: 500.0000 mg | ORAL_TABLET | Freq: Two times a day (BID) | ORAL | Status: DC

## 2013-02-10 MED ORDER — FLUOXETINE HCL 10 MG PO CAPS: 10.0000 mg | ORAL_CAPSULE | Freq: Every day | ORAL | Status: DC

## 2013-02-10 MED ORDER — AMLODIPINE BESYLATE 10 MG PO TABS: 10.0000 mg | ORAL_TABLET | Freq: Every day | ORAL | Status: DC

## 2013-02-10 NOTE — Progress Notes (Signed)
  Subjective:    Patient ID: Shelly Clark, female    DOB: [birth year], 67 y.o.   MRN: [id number]  HPI  67 year old Asian female with history of hypertension, dyslipidemia and tobacco use for followup. Over the last one year, we have titrated her antihypertensives upward. She has been under increased stress due to daughter's health issues (psychological) and stress from running restaurant business. She hasn't been able to sleep well. She notes "internal tension".  She is smoking 1 ppd.    Review of Systems Negative for chest pain  Past Medical History  Diagnosis Date  . Hyperlipidemia   . Hypertension   . Osteoporosis   . Tobacco abuse   . Benign hematuria     neg urology workup  . Migraines     History   Social History  . Marital Status: Married    Spouse Name: N/A    Number of Children: N/A  . 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 Smoking status: Current Every Day Smoker -- 1.00 packs/day for 40 years    Types: Cigarettes  . Smokeless tobacco: Never Used  . Alcohol Use: No  . Drug Use: No  . Sexually Active: Not on file   Other Topics Concern  . Not on file   Social History Narrative  . No narrative on file    No past surgical history on file.  Family History  Problem Relation Age of Onset  . Cancer Mother     ovarian  . Diabetes Mother   . Hypertension Mother   . Hyperlipidemia Mother   . Diabetes Father   . Hypertension Father   . Hyperlipidemia Father     Allergies  Allergen Reactions  . Codeine     Current Outpatient Prescriptions on File Prior to Visit  Medication Sig Dispense Refill  . aspirin EC 81 MG tablet Take 81 mg by mouth daily.      . B Complex-C (B-COMPLEX WITH VITAMIN C) tablet Take 1 tablet by mouth daily.      . diclofenac sodium (VOLTAREN) 1 % GEL Apply 2 g topically 4 (four) times daily.  500 g  1  . lisinopril (PRINIVIL,ZESTRIL) 40 MG tablet take 1 tablet by  mouth once daily  90 tablet  1  . Multiple Vitamin (MULTIVITAMIN WITH MINERALS) TABS Take 1 tablet by mouth daily.      . simvastatin (ZOCOR) 20 MG tablet take 1 tablet by mouth once daily  90 tablet  1   No current facility-administered medications on file prior to visit.    BP 144/82  Pulse 112  Temp(Src) 98.4 F (36.9 C) (Oral)  Wt 129 lb (58.514 kg)  BMI 26.04 kg/m2       Objective:   Physical Exam  Constitutional: She is oriented to person, place, and time. She appears well-developed and well-nourished.  Cardiovascular: Normal rate, regular rhythm and normal heart sounds.   Pulmonary/Chest: Effort normal and breath sounds normal. She has no wheezes.  Neurological: She is alert and oriented to person, place, and time. No cranial nerve deficit.  Psychiatric: She has a normal mood and affect. Her behavior is normal.          Assessment & Plan:

## 2013-02-10 NOTE — Assessment & Plan Note (Signed)
Patient is experiencing significant anxiety and stress secondary to daughter's medical issues. She reports poor sleep and chronic anxiety. Trial of fluoxetine 10 mg once daily. Use Ambien 5-10 mg at bedtime as needed. Reassess in one month.

## 2013-02-10 NOTE — Assessment & Plan Note (Signed)
BP stable.  I suspect life stressors playing role in upward trend of BP. Continue amlodipine 10 mg and lisinopril 40 mg. Monitor electrolytes and kidney function. BP: 144/82 mmHg

## 2013-03-07 ENCOUNTER — Other Ambulatory Visit (INDEPENDENT_AMBULATORY_CARE_PROVIDER_SITE_OTHER): Payer: 59

## 2013-03-07 DIAGNOSIS — I10 Essential (primary) hypertension: Secondary | ICD-10-CM

## 2013-03-07 DIAGNOSIS — R7309 Other abnormal glucose: Secondary | ICD-10-CM

## 2013-03-07 DIAGNOSIS — E785 Hyperlipidemia, unspecified: Secondary | ICD-10-CM

## 2013-03-07 LAB — LIPID PANEL
HDL: 61 mg/dL (ref >39.00)
Total CHOL/HDL Ratio: 4
VLDL: 30.6 mg/dL (ref 0.0–40.0)

## 2013-03-07 LAB — BASIC METABOLIC PANEL
BUN: 19 mg/dL (ref 6–23)
Chloride: 103 mEq/L (ref 96–112)
GFR: 156.91 mL/min (ref >60.00)
Potassium: 3.7 mEq/L (ref 3.5–5.1)
Sodium: 136 mEq/L (ref 135–145)

## 2013-03-07 LAB — HEPATIC FUNCTION PANEL
Albumin: 3.7 g/dL (ref 3.5–5.2)
Alkaline Phosphatase: 70 U/L (ref 39–117)
Total Bilirubin: 0.7 mg/dL (ref 0.3–1.2)

## 2013-03-07 LAB — HEMOGLOBIN A1C: Hgb A1c MFr Bld: 6.2 % (ref 4.6–6.5)

## 2013-03-12 ENCOUNTER — Ambulatory Visit: Payer: 59 | Admitting: Internal Medicine

## 2013-03-13 ENCOUNTER — Ambulatory Visit: Payer: 59 | Admitting: Internal Medicine

## 2013-03-27 ENCOUNTER — Encounter: Payer: Self-pay | Admitting: Internal Medicine

## 2013-03-27 ENCOUNTER — Ambulatory Visit (INDEPENDENT_AMBULATORY_CARE_PROVIDER_SITE_OTHER): Payer: 59 | Admitting: Internal Medicine

## 2013-03-27 VITALS — BP 146/82 | HR 88 | Temp 98.0°F | Wt 125.0 lb

## 2013-03-27 DIAGNOSIS — F4323 Adjustment disorder with mixed anxiety and depressed mood: Secondary | ICD-10-CM

## 2013-03-27 MED ORDER — CLONAZEPAM 0.5 MG PO TABS: 0.5000 mg | ORAL_TABLET | Freq: Every evening | ORAL | Status: DC | PRN

## 2013-03-27 MED ORDER — FLUOXETINE HCL 10 MG PO CAPS: 10.0000 mg | ORAL_CAPSULE | Freq: Every day | ORAL | Status: DC

## 2013-03-27 NOTE — Assessment & Plan Note (Signed)
Good response to fluoxetine 10 mg once daily. Discontinue Ambien due to sleepwalking. Use clonazepam 0.5 mg at bedtime as needed.

## 2013-03-27 NOTE — Progress Notes (Signed)
Subjective:    Patient ID: Shelly Clark, female    DOB: [birth year], 67 y.o.   MRN: [id number]  HPI  67 year old Asian female with history of hypertension, dyslipidemia and tobacco use for followup. Patient seen 02/10/2013 secondary to symptoms of anxiety/stress reaction. Patient started on fluoxetine 10 mg once daily. Patient was also experiencing significant issues with insomnia. She has been using zolpidem 10 mg at bedtime as needed. Her family members have noted sleepwalking while using Ambien.  Overall, patient reports her mood has significantly improved.  Review of Systems Negative for weight gain  Past Medical History  Diagnosis Date  . Hyperlipidemia   . Hypertension   . Osteoporosis   . Tobacco abuse   . Benign hematuria     neg urology workup  . Migraines     History   Social History  . Marital Status: Married    Spouse Name: N/A    Number of Children: N/A  . 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 Smoking status: Current Every Day Smoker -- 1.00 packs/day for 40 years    Types: Cigarettes  . Smokeless tobacco: Never Used  . Alcohol Use: No  . Drug Use: No  . Sexually Active: Not on file   Other Topics Concern  . Not on file   Social History Narrative  . No narrative on file    No past surgical history on file.  Family History  Problem Relation Age of Onset  . Cancer Mother     ovarian  . Diabetes Mother   . Hypertension Mother   . Hyperlipidemia Mother   . Diabetes Father   . Hypertension Father   . Hyperlipidemia Father     Allergies  Allergen Reactions  . Codeine     Current Outpatient Prescriptions on File Prior to Visit  Medication Sig Dispense Refill  . amLODipine (NORVASC) 10 MG tablet Take 1 tablet (10 mg total) by mouth daily.  90 tablet  1  . aspirin EC 81 MG tablet Take 81 mg by mouth daily.      . B Complex-C (B-COMPLEX WITH VITAMIN C) tablet Take 1 tablet by  mouth daily.      . diclofenac sodium (VOLTAREN) 1 % GEL Apply 2 g topically 4 (four) times daily.  500 g  1  . lisinopril (PRINIVIL,ZESTRIL) 40 MG tablet take 1 tablet by mouth once daily  90 tablet  1  . metFORMIN (GLUCOPHAGE) 500 MG tablet Take 1 tablet (500 mg total) by mouth 2 (two) times daily with a meal.  180 tablet  1  . Multiple Vitamin (MULTIVITAMIN WITH MINERALS) TABS Take 1 tablet by mouth daily.      . simvastatin (ZOCOR) 20 MG tablet take 1 tablet by mouth once daily  90 tablet  1   No current facility-administered medications on file prior to visit.    BP 146/82  Pulse 88  Temp(Src) 98 F (36.7 C) (Oral)  Wt 125 lb (56.7 kg)  BMI 25.23 kg/m2       Objective:   Physical Exam  Constitutional: She appears well-developed and well-nourished.  Cardiovascular: Normal rate, regular rhythm and normal heart sounds.   Pulmonary/Chest: Effort normal and breath sounds normal.  Skin: Skin is warm.  Psychiatric: She has a normal mood and affect. Her behavior is normal.          Assessment & Plan:

## 2013-05-18 ENCOUNTER — Other Ambulatory Visit: Payer: Self-pay | Admitting: Internal Medicine

## 2013-05-22 ENCOUNTER — Other Ambulatory Visit: Payer: Self-pay | Admitting: *Deleted

## 2013-05-22 MED ORDER — ZOLPIDEM TARTRATE 5 MG PO TABS: 5.0000 mg | ORAL_TABLET | Freq: Every evening | ORAL | Status: DC | PRN

## 2013-07-30 ENCOUNTER — Other Ambulatory Visit: Payer: Self-pay | Admitting: Internal Medicine

## 2013-07-31 ENCOUNTER — Ambulatory Visit (INDEPENDENT_AMBULATORY_CARE_PROVIDER_SITE_OTHER): Payer: BC Managed Care – PPO | Admitting: Physician Assistant

## 2013-07-31 VITALS — BP 164/84 | HR 74 | Temp 98.2°F | Resp 20 | Ht <= 58 in | Wt 124.2 lb

## 2013-07-31 DIAGNOSIS — L237 Allergic contact dermatitis due to plants, except food: Secondary | ICD-10-CM

## 2013-07-31 DIAGNOSIS — L255 Unspecified contact dermatitis due to plants, except food: Secondary | ICD-10-CM

## 2013-07-31 MED ORDER — HYDROXYZINE HCL 25 MG PO TABS: 12.5000 mg | ORAL_TABLET | Freq: Three times a day (TID) | ORAL | Status: DC | PRN

## 2013-07-31 MED ORDER — DIPHENHYDRAMINE HCL 2 % EX GEL: 1.0000 "application " | Freq: Four times a day (QID) | CUTANEOUS | Status: DC | PRN

## 2013-07-31 MED ORDER — CLOBETASOL PROPIONATE 0.05 % EX CREA: TOPICAL_CREAM | Freq: Two times a day (BID) | CUTANEOUS | Status: DC

## 2013-07-31 MED ORDER — CETIRIZINE HCL 10 MG PO TABS: 10.0000 mg | ORAL_TABLET | Freq: Every day | ORAL | Status: DC

## 2013-07-31 NOTE — Patient Instructions (Addendum)
Begin using the clobetasol (steroid cream) twice daily to the affected areas.  Use diphenhydramine (Benadryl) gel every 6 hours if needed for itching.  Take cetirizine (Zyrtec) in the morning to help with itching.  Take hydroxyzine (Atarax) at bed time for itching - this will make you sleepy!  Please let us know if any symptoms are worsening or not improving.  Make sure you wash all your clothes, shoes, gloves, etc that could have come in contact with poison ivy to prevent re-exposure   Poison [facility] ivy is a inflammation of the skin (contact dermatitis) caused by touching the allergens on the leaves of the ivy plant following previous exposure to the plant. The rash usually appears 48 hours after exposure. The rash is usually bumps (papules) or blisters (vesicles) in a linear pattern. Depending on your own sensitivity, the rash may simply cause redness and itching, or it may also progress to blisters which may break open. These must be well cared for to prevent secondary bacterial (germ) infection, followed by scarring. Keep any open areas dry, clean, dressed, and covered with an antibacterial ointment if needed. The eyes may also get puffy. The puffiness is worst in the morning and gets better as the day progresses. This dermatitis usually heals without scarring, within 2 to 3 weeks without treatment. HOME CARE INSTRUCTIONS  Thoroughly wash with soap and water as soon as you have been exposed to poison ivy. You have about one half hour to remove the plant resin before it will cause the rash. This washing will destroy the oil or antigen on the skin that is causing, or will cause, the rash. Be sure to wash under your fingernails as any plant resin there will continue to spread the rash. Do not rub skin vigorously when washing affected area. Poison ivy cannot spread if no oil from the plant remains on your body. A rash that has progressed to weeping sores will not spread the rash unless you have not washed  thoroughly. It is also important to wash any clothes you have been wearing as these may carry active allergens. The rash will return if you wear the unwashed clothing, even several days later. Avoidance of the plant in the future is the best measure. Poison ivy plant can be recognized by the number of leaves. Generally, poison ivy has three leaves with flowering branches on a single stem. Diphenhydramine may be purchased over the counter and used as needed for itching. Do not drive with this medication if it makes you drowsy.Ask your caregiver about medication for children. SEEK MEDICAL CARE IF:  Open sores develop.  Redness spreads beyond area of rash.  You notice purulent (pus-like) discharge.  You have increased pain.  Other signs of infection develop (such as fever). Document Released: 11/10/2000 Document Revised: 02/05/2012 Document Reviewed: 09/29/2009 [facility] Patient Information 2014 [place], [place].
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
## 2013-09-04 ENCOUNTER — Other Ambulatory Visit: Payer: Self-pay | Admitting: Internal Medicine

## 2013-09-05 ENCOUNTER — Other Ambulatory Visit: Payer: Self-pay | Admitting: Internal Medicine

## 2013-09-05 MED ORDER — LISINOPRIL 40 MG PO TABS: 40.0000 mg | ORAL_TABLET | Freq: Every day | ORAL | Status: DC

## 2013-09-05 MED ORDER — AMLODIPINE BESYLATE 10 MG PO TABS: 10.0000 mg | ORAL_TABLET | Freq: Every day | ORAL | Status: DC

## 2013-09-05 MED ORDER — METFORMIN HCL 500 MG PO TABS: 500.0000 mg | ORAL_TABLET | Freq: Two times a day (BID) | ORAL | Status: DC

## 2013-09-05 MED ORDER — SIMVASTATIN 20 MG PO TABS: ORAL_TABLET | ORAL | Status: DC

## 2013-12-31 ENCOUNTER — Encounter: Payer: Self-pay | Admitting: Internal Medicine

## 2013-12-31 ENCOUNTER — Ambulatory Visit (INDEPENDENT_AMBULATORY_CARE_PROVIDER_SITE_OTHER): Payer: Medicare Other | Admitting: Internal Medicine

## 2013-12-31 VITALS — BP 126/74 | HR 80 | Temp 97.9°F | Ht <= 58 in | Wt 130.0 lb

## 2013-12-31 DIAGNOSIS — R7309 Other abnormal glucose: Secondary | ICD-10-CM

## 2013-12-31 DIAGNOSIS — F17200 Nicotine dependence, unspecified, uncomplicated: Secondary | ICD-10-CM

## 2013-12-31 DIAGNOSIS — I10 Essential (primary) hypertension: Secondary | ICD-10-CM

## 2013-12-31 DIAGNOSIS — F4323 Adjustment disorder with mixed anxiety and depressed mood: Secondary | ICD-10-CM

## 2013-12-31 DIAGNOSIS — E785 Hyperlipidemia, unspecified: Secondary | ICD-10-CM

## 2013-12-31 MED ORDER — AMLODIPINE BESYLATE 10 MG PO TABS: 10.0000 mg | ORAL_TABLET | Freq: Every day | ORAL | Status: DC

## 2013-12-31 MED ORDER — CLONAZEPAM 0.5 MG PO TABS: 0.5000 mg | ORAL_TABLET | Freq: Every evening | ORAL | Status: DC | PRN

## 2013-12-31 MED ORDER — SIMVASTATIN 20 MG PO TABS: ORAL_TABLET | ORAL | Status: DC

## 2013-12-31 MED ORDER — FLUOXETINE HCL 10 MG PO CAPS: 10.0000 mg | ORAL_CAPSULE | Freq: Every day | ORAL | Status: DC

## 2013-12-31 MED ORDER — METFORMIN HCL 500 MG PO TABS: 500.0000 mg | ORAL_TABLET | Freq: Two times a day (BID) | ORAL | Status: DC

## 2013-12-31 MED ORDER — LISINOPRIL 40 MG PO TABS: 40.0000 mg | ORAL_TABLET | Freq: Every day | ORAL | Status: DC

## 2013-12-31 NOTE — Assessment & Plan Note (Signed)
Patient back to smoking one pack per day. She does not want to retry Chantix. I stressed importance of tobacco cessation. She is going to use over-the-counter nicotine gum.

## 2013-12-31 NOTE — Progress Notes (Signed)
Pre visit review using our clinic review tool, if applicable. No additional management support is needed unless otherwise documented below in the visit note. 

## 2013-12-31 NOTE — Assessment & Plan Note (Signed)
Stable. No change in medication regimen. Monitor electrolytes and kidney function. BP: 126/74 mmHg

## 2013-12-31 NOTE — Assessment & Plan Note (Signed)
Good response to fluoxetine.  Continue 10 mg.  Discontinue zolpidem. Patient experienced sleep walking. Use clonazepam 0.5 mg at bedtime as needed for sleep.

## 2013-12-31 NOTE — Assessment & Plan Note (Signed)
Monitor A1c 

## 2013-12-31 NOTE — Progress Notes (Signed)
Subjective:    Patient ID: Shelly Clark, female    DOB: 11/16/1946, 68 y.o.   MRN: [id number]  HPI  68 year old Asian female with history of hypertension, hyperlipidemia, tobacco use and abnormal glucose routine followup. At previous visit patient was struggling with anxiety and insomnia. She had very good response to fluoxetine 10 mg. She tried Ambien 10 mg but it caused sleep walking. Ambien was changed to clonazepam 0.5 mg.  Hypertension-stable  Hyperlipidemia-she is tolerating her statin  Tobacco use-she is smoking one pack per day. She does not want to try Chantix again. She reports experiencing bad dreams while taking Chantix.  Review of Systems Negative for chest pain or shortness of breath    Past Medical History  Diagnosis Date  . Hyperlipidemia   . Hypertension   . Osteoporosis   . Tobacco abuse   . Benign hematuria     neg urology workup  . Migraines   . Diabetes mellitus without complication     History   Social History  . Marital Status: Married    Spouse Name: N/A    Number of Children: N/A  . 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 Smoking status: Current Every Day Smoker -- 1.00 packs/day for 40 years    Types: Cigarettes  . Smokeless tobacco: Never Used  . Alcohol Use: No  . Drug Use: No  . Sexual Activity: Not on file   Other Topics Concern  . Not on file   Social History Narrative  . No narrative on file    Past Surgical History  Procedure Laterality Date  . Abdominal hysterectomy      Family History  Problem Relation Age of Onset  . Cancer Mother     ovarian  . Diabetes Mother   . Hypertension Mother   . Hyperlipidemia Mother   . Diabetes Father   . Hypertension Father   . Hyperlipidemia Father   . Hypertension Brother   . Hypertension Daughter   . Hypertension Daughter     Allergies  Allergen Reactions  . Codeine     Current Outpatient Prescriptions  on File Prior to Visit  Medication Sig Dispense Refill  . aspirin EC 81 MG tablet Take 81 mg by mouth daily.      . B Complex-C (B-COMPLEX WITH VITAMIN C) tablet Take 1 tablet by mouth daily.      . Multiple Vitamin (MULTIVITAMIN WITH MINERALS) TABS Take 1 tablet by mouth daily.      Marland Kitchen zolpidem (AMBIEN) 5 MG tablet Take 1 tablet (5 mg total) by mouth at bedtime as needed for sleep.  30 tablet  3   No current facility-administered medications on file prior to visit.    BP 126/74  Pulse 80  Temp(Src) 97.9 F (36.6 C) (Oral)  Ht 4' 1.5" (1.257 m)  Wt 130 lb (58.968 kg)  BMI 37.32 kg/m2     Objective:   Physical Exam  Constitutional: She is oriented to person, place, and time. She appears well-developed and well-nourished. No distress.  HENT:  Head: Normocephalic and atraumatic.  Neck: Neck supple.  No carotid bruit  Cardiovascular: Normal rate, regular rhythm and normal heart sounds.   No murmur heard. Pulmonary/Chest: Effort normal and breath sounds normal. She has no wheezes.  Musculoskeletal: She exhibits no edema.  Neurological: She is alert and oriented to person, place, and time. No cranial nerve  deficit.  Skin: Skin is warm and dry.  Psychiatric: She has a normal mood and affect. Her behavior is normal.          Assessment & Plan:

## 2014-01-01 LAB — BASIC METABOLIC PANEL
BUN: 15 mg/dL (ref 6–23)
CHLORIDE: 106 meq/L (ref 96–112)
CO2: 28 mEq/L (ref 19–32)
CREATININE: 0.5 mg/dL (ref 0.4–1.2)
Calcium: 10.2 mg/dL (ref 8.4–10.5)
GFR: 125.69 mL/min (ref >60.00)
Glucose, Bld: 84 mg/dL (ref 70–99)
Potassium: 4.4 mEq/L (ref 3.5–5.1)
Sodium: 142 mEq/L (ref 135–145)

## 2014-01-01 LAB — LIPID PANEL
CHOLESTEROL: 173 mg/dL (ref 0–200)
HDL: 66.8 mg/dL (ref >39.00)
LDL Cholesterol: 74 mg/dL (ref 0–99)
Total CHOL/HDL Ratio: 3
Triglycerides: 159 mg/dL — ABNORMAL HIGH (ref 0.0–149.0)
VLDL: 31.8 mg/dL (ref 0.0–40.0)

## 2014-01-01 LAB — HEPATIC FUNCTION PANEL
ALBUMIN: 4.4 g/dL (ref 3.5–5.2)
ALT: 26 U/L (ref 0–35)
AST: 25 U/L (ref 0–37)
Alkaline Phosphatase: 86 U/L (ref 39–117)
Bilirubin, Direct: 0 mg/dL (ref 0.0–0.3)
Total Bilirubin: 0.4 mg/dL (ref 0.3–1.2)
Total Protein: 7.3 g/dL (ref 6.0–8.3)

## 2014-01-01 LAB — HEMOGLOBIN A1C: Hgb A1c MFr Bld: 6.3 % (ref 4.6–6.5)

## 2014-01-01 LAB — TSH: TSH: 0.88 u[IU]/mL (ref 0.35–5.50)

## 2014-01-02 ENCOUNTER — Telehealth: Payer: Self-pay | Admitting: Internal Medicine

## 2014-01-02 NOTE — Telephone Encounter (Signed)
Relevant patient education mailed to patient.  

## 2014-05-15 ENCOUNTER — Other Ambulatory Visit: Payer: Self-pay | Admitting: Internal Medicine

## 2014-05-15 MED ORDER — LISINOPRIL 40 MG PO TABS: 40.0000 mg | ORAL_TABLET | Freq: Every day | ORAL | Status: DC

## 2014-05-15 MED ORDER — AMLODIPINE BESYLATE 10 MG PO TABS: 10.0000 mg | ORAL_TABLET | Freq: Every day | ORAL | Status: DC

## 2014-05-15 MED ORDER — FLUOXETINE HCL 10 MG PO CAPS: 10.0000 mg | ORAL_CAPSULE | Freq: Every day | ORAL | Status: DC

## 2014-05-15 MED ORDER — SIMVASTATIN 20 MG PO TABS: ORAL_TABLET | ORAL | Status: DC

## 2014-05-15 MED ORDER — METFORMIN HCL 500 MG PO TABS: 500.0000 mg | ORAL_TABLET | Freq: Two times a day (BID) | ORAL | Status: DC

## 2014-09-18 ENCOUNTER — Other Ambulatory Visit: Payer: Self-pay | Admitting: Internal Medicine

## 2014-10-21 ENCOUNTER — Ambulatory Visit: Payer: Medicare Other | Admitting: Internal Medicine

## 2015-02-10 ENCOUNTER — Telehealth: Payer: Self-pay | Admitting: Internal Medicine

## 2015-02-10 NOTE — Telephone Encounter (Signed)
Dr. Shawna Orleans wanted patient to come back for a med check appointment.  I spoke to patient's husband and he said he will have her call the office.  I called patient's number and a voicemail didn't pick up and patient's daughter's number has a voicemail is full  Message.

## 2015-02-10 NOTE — Telephone Encounter (Signed)
Patient called back and is scheduled for 02/24/15.  She would like me to tell Dr. Shawna Orleans she said, "please take it easy on me".

## 2015-02-24 ENCOUNTER — Encounter: Payer: Self-pay | Admitting: Internal Medicine

## 2015-02-24 ENCOUNTER — Ambulatory Visit (INDEPENDENT_AMBULATORY_CARE_PROVIDER_SITE_OTHER): Payer: Medicare Other | Admitting: Internal Medicine

## 2015-02-24 VITALS — BP 142/74 | HR 80 | Temp 98.5°F | Ht <= 58 in | Wt 125.0 lb

## 2015-02-24 DIAGNOSIS — Z23 Encounter for immunization: Secondary | ICD-10-CM | POA: Diagnosis not present

## 2015-02-24 DIAGNOSIS — I10 Essential (primary) hypertension: Secondary | ICD-10-CM | POA: Diagnosis not present

## 2015-02-24 DIAGNOSIS — R1310 Dysphagia, unspecified: Secondary | ICD-10-CM | POA: Diagnosis not present

## 2015-02-24 DIAGNOSIS — E785 Hyperlipidemia, unspecified: Secondary | ICD-10-CM

## 2015-02-24 DIAGNOSIS — E119 Type 2 diabetes mellitus without complications: Secondary | ICD-10-CM

## 2015-02-24 DIAGNOSIS — Z720 Tobacco use: Secondary | ICD-10-CM

## 2015-02-24 DIAGNOSIS — F17200 Nicotine dependence, unspecified, uncomplicated: Secondary | ICD-10-CM

## 2015-02-24 LAB — LIPID PANEL
CHOL/HDL RATIO: 3
CHOLESTEROL: 195 mg/dL (ref 0–200)
HDL: 74.7 mg/dL (ref >39.00)
LDL Cholesterol: 106 mg/dL — ABNORMAL HIGH (ref 0–99)
NonHDL: 120.3
Triglycerides: 74 mg/dL (ref 0.0–149.0)
VLDL: 14.8 mg/dL (ref 0.0–40.0)

## 2015-02-24 LAB — MICROALBUMIN / CREATININE URINE RATIO
Creatinine,U: 55.8 mg/dL
MICROALB/CREAT RATIO: 77.6 mg/g — AB (ref 0.0–30.0)
Microalb, Ur: 43.3 mg/dL — ABNORMAL HIGH (ref 0.0–1.9)

## 2015-02-24 LAB — BASIC METABOLIC PANEL
BUN: 10 mg/dL (ref 6–23)
CHLORIDE: 104 meq/L (ref 96–112)
CO2: 30 meq/L (ref 19–32)
Calcium: 10 mg/dL (ref 8.4–10.5)
Creatinine, Ser: 0.55 mg/dL (ref 0.40–1.20)
GFR: 117.39 mL/min (ref >60.00)
Glucose, Bld: 108 mg/dL — ABNORMAL HIGH (ref 70–99)
POTASSIUM: 4 meq/L (ref 3.5–5.1)
Sodium: 138 mEq/L (ref 135–145)

## 2015-02-24 LAB — HEPATIC FUNCTION PANEL
ALT: 25 U/L (ref 0–35)
AST: 26 U/L (ref 0–37)
Albumin: 4.7 g/dL (ref 3.5–5.2)
Alkaline Phosphatase: 93 U/L (ref 39–117)
Bilirubin, Direct: 0.1 mg/dL (ref 0.0–0.3)
Total Bilirubin: 0.4 mg/dL (ref 0.2–1.2)
Total Protein: 7.6 g/dL (ref 6.0–8.3)

## 2015-02-24 LAB — HEMOGLOBIN A1C: Hgb A1c MFr Bld: 6.3 % (ref 4.6–6.5)

## 2015-02-24 LAB — TSH: TSH: 1.17 u[IU]/mL (ref 0.35–4.50)

## 2015-02-24 MED ORDER — SIMVASTATIN 20 MG PO TABS: ORAL_TABLET | ORAL | Status: DC

## 2015-02-24 MED ORDER — METFORMIN HCL 500 MG PO TABS: ORAL_TABLET | ORAL | Status: DC

## 2015-02-24 MED ORDER — LISINOPRIL 40 MG PO TABS: 40.0000 mg | ORAL_TABLET | Freq: Every day | ORAL | Status: DC

## 2015-02-24 MED ORDER — AMLODIPINE BESYLATE 10 MG PO TABS: 10.0000 mg | ORAL_TABLET | Freq: Every day | ORAL | Status: DC

## 2015-02-24 MED ORDER — FLUOXETINE HCL 10 MG PO CAPS: 10.0000 mg | ORAL_CAPSULE | Freq: Every day | ORAL | Status: DC

## 2015-02-24 NOTE — Assessment & Plan Note (Signed)
Stable. Continue low carb diet and metformin.  Lab Results  Component Value Date   HGBA1C 6.3 02/24/2015   HGBA1C 6.3 12/31/2013   HGBA1C 6.2 03/07/2013   Lab Results  Component Value Date   MICROALBUR 43.3* 02/24/2015   LDLCALC 106* 02/24/2015   CREATININE 0.55 02/24/2015

## 2015-02-24 NOTE — Assessment & Plan Note (Signed)
Continue statin therapy. Monitor LFTs.

## 2015-02-24 NOTE — Assessment & Plan Note (Addendum)
Patient complains of intermittent dysphagia to liquids and solids. She reports normal EGD 7 years ago. Considering history of tobacco use, obtain barium swallow study.  Rule out esophageal mass.

## 2015-02-24 NOTE — Progress Notes (Signed)
Subjective:    Patient ID: Shelly Clark, female    DOB: 18-Jun-1946, 69 y.o.   MRN: [id number]  HPI  69 year old Asian female with history of type 2 diabetes, hypertension, hyperlipidemia and tobacco use for routine follow-up. She is overdue for her routine visit. Overall patient has been doing well. She reports good compliance with her medications. She monitors her blood pressure at home. Patient reports her home blood pressure readings usually 093 systolic.  Type 2 diabetes-blood sugar readings at home are normal. Patient doing better with her diet. She had major dental work within the past year. She has significantly adjusted her diet.  Patient complains of intermittent dysphagia. Symptoms can be from liquids or solids. She denies any reflux symptoms. Patient reports she had EGD 7 years ago-reported normal.  No change in smoking.  Review of Systems  negative for chest pain, negative for shortness of breath    Past Medical History  Diagnosis Date  . Hyperlipidemia   . Hypertension   . Osteoporosis   . Tobacco abuse   . Benign hematuria     neg urology workup  . Migraines   . Diabetes mellitus without complication     History   Social History  . Marital Status: Married    Spouse Name: N/A  . Number of Children: N/A  . 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 Smoking status: Current Every Day Smoker -- 1.00 packs/day for 40 years    Types: Cigarettes  . Smokeless tobacco: Never Used  . Alcohol Use: No  . Drug Use: No  . Sexual Activity: Not on file   Other Topics Concern  . Not on file   Social History Narrative    Past Surgical History  Procedure Laterality Date  . Abdominal hysterectomy      Family History  Problem Relation Age of Onset  . Cancer Mother     ovarian  . Diabetes Mother   . Hypertension Mother   . Hyperlipidemia Mother   . Diabetes Father   . Hypertension Father   .  Hyperlipidemia Father   . Hypertension Brother   . Hypertension Daughter   . Hypertension Daughter     Allergies  Allergen Reactions  . Codeine     Current Outpatient Prescriptions on File Prior to Visit  Medication Sig Dispense Refill  . amLODipine (NORVASC) 10 MG tablet Take 1 tablet (10 mg total) by mouth daily. 90 tablet 0  . aspirin EC 81 MG tablet Take 81 mg by mouth daily.    . B Complex-C (B-COMPLEX WITH VITAMIN C) tablet Take 1 tablet by mouth daily.    . clonazePAM (KLONOPIN) 0.5 MG tablet Take 1 tablet (0.5 mg total) by mouth at bedtime as needed for anxiety. 30 tablet 5  . FLUoxetine (PROZAC) 10 MG capsule Take 1 capsule (10 mg total) by mouth daily. 90 capsule 0  . lisinopril (PRINIVIL,ZESTRIL) 40 MG tablet Take 1 tablet (40 mg total) by mouth daily. 90 tablet 0  . metFORMIN (GLUCOPHAGE) 500 MG tablet take 1 tablet by mouth twice a day WITH A MEAL. 180 tablet 0  . Multiple Vitamin (MULTIVITAMIN WITH MINERALS) TABS Take 1 tablet by mouth daily.    . simvastatin (ZOCOR) 20 MG tablet Take 1 tablet by mouth once daily 90 tablet 0  . zolpidem (AMBIEN) 5 MG tablet Take 1 tablet (5 mg total) by mouth  at bedtime as needed for sleep. 30 tablet 3   No current facility-administered medications on file prior to visit.    BP 142/74 mmHg  Pulse 80  Temp(Src) 98.5 F (36.9 C) (Oral)  Ht 4' 1.5" (1.257 m)  Wt 125 lb (56.7 kg)  BMI 35.88 kg/m2    Objective:   Physical Exam  Constitutional: She is oriented to person, place, and time. She appears well-developed and well-nourished. No distress.  HENT:  Head: Normocephalic and atraumatic.  Eyes: EOM are normal. Pupils are equal, round, and reactive to light.  Cardiovascular: Normal rate, regular rhythm and normal heart sounds.   No murmur heard. Pulmonary/Chest: Effort normal and breath sounds normal. She has no wheezes.  Neurological: She is alert and oriented to person, place, and time. No cranial nerve deficit.  Skin: Skin  is warm and dry.  Psychiatric: She has a normal mood and affect. Her behavior is normal.          Assessment & Plan:

## 2015-02-24 NOTE — Assessment & Plan Note (Signed)
BP is stable.  Home BP readings are within range. Monitor electrolytes and kidney function.  BP: (!) 142/74 mmHg

## 2015-02-24 NOTE — Progress Notes (Signed)
Pre visit review using our clinic review tool, if applicable. No additional management support is needed unless otherwise documented below in the visit note. 

## 2015-02-24 NOTE — Assessment & Plan Note (Signed)
Smoking cessation strongly encouraged.

## 2015-02-24 NOTE — Patient Instructions (Signed)
Return in 6 months for medicare wellness exam It is important that you quit smoking (use nicotine gums or lozenges as directed)

## 2015-02-25 ENCOUNTER — Encounter: Payer: Self-pay | Admitting: Internal Medicine

## 2015-03-02 ENCOUNTER — Ambulatory Visit (HOSPITAL_COMMUNITY): Payer: Medicare Other

## 2015-03-08 ENCOUNTER — Ambulatory Visit (HOSPITAL_COMMUNITY)
Admission: RE | Admit: 2015-03-08 | Discharge: 2015-03-08 | Disposition: A | Discharge status: Home / Self Care | Payer: Medicare Other | Source: Ambulatory Visit | Attending: Internal Medicine | Admitting: Internal Medicine

## 2015-03-08 ENCOUNTER — Telehealth: Payer: Self-pay | Admitting: *Deleted

## 2015-03-08 DIAGNOSIS — K224 Dyskinesia of esophagus: Secondary | ICD-10-CM | POA: Diagnosis not present

## 2015-03-08 DIAGNOSIS — R9389 Abnormal findings on diagnostic imaging of other specified body structures: Secondary | ICD-10-CM

## 2015-03-08 DIAGNOSIS — R1310 Dysphagia, unspecified: Secondary | ICD-10-CM | POA: Diagnosis present

## 2015-03-08 DIAGNOSIS — K219 Gastro-esophageal reflux disease without esophagitis: Secondary | ICD-10-CM | POA: Diagnosis not present

## 2015-03-08 NOTE — Telephone Encounter (Signed)
Shelly Clark called from [facility] [phone]) with a call report of the esophagram--reveals moderate esophageal motility with intra-esophageal reflux, tertiary contractions, mild consistent narrowing at gastro-esophageal junction without hiatal hernia identified.  Consider correlation with upper endoscopy if not previously performed-suggestion of nodular density projecting over left lower hemi-thorax on overhead image, recommend repeat chest x-ray with nipple marker, questionable nipple.

## 2015-03-08 NOTE — Telephone Encounter (Signed)
Call pt - radiologist also recommends CXR PA / LAT with nipple marker.  Question nodular density of left lower thorax

## 2015-03-09 NOTE — Telephone Encounter (Signed)
Pt aware, referral order placed.  She will get xray tomorrow

## 2015-03-10 ENCOUNTER — Other Ambulatory Visit: Payer: Self-pay | Admitting: Internal Medicine

## 2015-03-10 ENCOUNTER — Ambulatory Visit (INDEPENDENT_AMBULATORY_CARE_PROVIDER_SITE_OTHER)
Admission: RE | Admit: 2015-03-10 | Discharge: 2015-03-10 | Disposition: A | Discharge status: Home / Self Care | Payer: Medicare Other | Source: Ambulatory Visit | Attending: Internal Medicine | Admitting: Internal Medicine

## 2015-03-10 DIAGNOSIS — R9389 Abnormal findings on diagnostic imaging of other specified body structures: Secondary | ICD-10-CM

## 2015-03-10 DIAGNOSIS — R938 Abnormal findings on diagnostic imaging of other specified body structures: Secondary | ICD-10-CM

## 2015-03-10 DIAGNOSIS — K222 Esophageal obstruction: Secondary | ICD-10-CM

## 2015-03-15 ENCOUNTER — Encounter: Payer: Self-pay | Admitting: Internal Medicine

## 2015-04-19 ENCOUNTER — Telehealth: Payer: Self-pay

## 2015-04-19 NOTE — Telephone Encounter (Signed)
Attempted to reach pt concerning need for mammogram. No voicemail pick up

## 2015-04-22 ENCOUNTER — Encounter: Payer: Self-pay | Admitting: *Deleted

## 2015-05-24 ENCOUNTER — Ambulatory Visit: Payer: Medicare Other | Admitting: Internal Medicine

## 2015-08-25 ENCOUNTER — Ambulatory Visit: Payer: Medicare Other | Admitting: Internal Medicine

## 2015-10-29 ENCOUNTER — Other Ambulatory Visit: Payer: Self-pay | Admitting: *Deleted

## 2015-10-29 NOTE — Telephone Encounter (Signed)
Patient would like a refill of xanax.  Okay to fill?

## 2015-11-01 NOTE — Telephone Encounter (Signed)
Ok to RF x 1

## 2015-11-03 MED ORDER — METFORMIN HCL 500 MG PO TABS: ORAL_TABLET | ORAL | Status: DC

## 2015-11-03 MED ORDER — AMLODIPINE BESYLATE 10 MG PO TABS: 10.0000 mg | ORAL_TABLET | Freq: Every day | ORAL | Status: DC

## 2015-11-03 MED ORDER — LISINOPRIL 40 MG PO TABS: 40.0000 mg | ORAL_TABLET | Freq: Every day | ORAL | Status: DC

## 2015-11-03 MED ORDER — SIMVASTATIN 20 MG PO TABS: ORAL_TABLET | ORAL | Status: DC

## 2015-11-03 MED ORDER — FLUOXETINE HCL 10 MG PO CAPS: 10.0000 mg | ORAL_CAPSULE | Freq: Every day | ORAL | Status: DC

## 2015-11-03 MED ORDER — CLONAZEPAM 0.5 MG PO TABS: 0.5000 mg | ORAL_TABLET | Freq: Every evening | ORAL | Status: DC | PRN

## 2015-11-03 NOTE — Telephone Encounter (Signed)
Patient was confused with her daughter.  Refill was not filled.

## 2015-11-08 ENCOUNTER — Ambulatory Visit: Payer: Medicare Other | Admitting: Adult Health

## 2016-07-04 ENCOUNTER — Ambulatory Visit: Payer: Medicare Other | Admitting: Adult Health

## 2016-07-04 DIAGNOSIS — Z0289 Encounter for other administrative examinations: Secondary | ICD-10-CM

## 2016-07-11 ENCOUNTER — Ambulatory Visit (INDEPENDENT_AMBULATORY_CARE_PROVIDER_SITE_OTHER): Payer: PPO | Admitting: Adult Health

## 2016-07-11 ENCOUNTER — Encounter: Payer: Self-pay | Admitting: Adult Health

## 2016-07-11 VITALS — BP 126/72 | Temp 98.6°F | Ht <= 58 in | Wt 126.8 lb

## 2016-07-11 DIAGNOSIS — E2839 Other primary ovarian failure: Secondary | ICD-10-CM

## 2016-07-11 DIAGNOSIS — I10 Essential (primary) hypertension: Secondary | ICD-10-CM | POA: Diagnosis not present

## 2016-07-11 DIAGNOSIS — Z7189 Other specified counseling: Secondary | ICD-10-CM

## 2016-07-11 DIAGNOSIS — E119 Type 2 diabetes mellitus without complications: Secondary | ICD-10-CM | POA: Diagnosis not present

## 2016-07-11 DIAGNOSIS — Z1211 Encounter for screening for malignant neoplasm of colon: Secondary | ICD-10-CM | POA: Diagnosis not present

## 2016-07-11 DIAGNOSIS — Z7689 Persons encountering health services in other specified circumstances: Secondary | ICD-10-CM

## 2016-07-11 LAB — POCT GLYCOSYLATED HEMOGLOBIN (HGB A1C): HEMOGLOBIN A1C: 6.1

## 2016-07-11 NOTE — Progress Notes (Signed)
Patient presents to clinic today to establish care. She is a pleasant 70 year old asian female who  has a past medical history of Benign hematuria; Diabetes mellitus without complication ([place]); Esophageal dysmotility; Hyperlipidemia; Hypertension; Migraines; Osteoporosis; and Tobacco abuse.   Acute Concerns: Establish Care  Chronic Issues: Tobacco Use - She continues to smoke and does not want to quit and has no plans to quit.   Hypertension  - Well controlled.   Diabetes - She has changed her diet. She feels as though her blood sugars are controlled.    Health Maintenance: Dental -- Routine  Vision -- Does not have one  Immunizations -- UTD Colonoscopy -- " 10 years ago"  Mammogram -- She  Bone Density -- " I have had one but don't know when"    Past Medical History:  Diagnosis Date  . Benign hematuria    neg urology workup  . Diabetes mellitus without complication ([place])   . Esophageal dysmotility   . Hyperlipidemia   . Hypertension   . Migraines   . Osteoporosis   . Tobacco abuse     Past Surgical History:  Procedure Laterality Date  . ABDOMINAL HYSTERECTOMY      Current Outpatient Prescriptions on File Prior to Visit  Medication Sig Dispense Refill  . amLODipine (NORVASC) 10 MG tablet Take 1 tablet (10 mg total) by mouth daily. 90 tablet 1  . aspirin EC 81 MG tablet Take 81 mg by mouth daily.    . B Complex-C (B-COMPLEX WITH VITAMIN C) tablet Take 1 tablet by mouth daily.    . clonazePAM (KLONOPIN) 0.5 MG tablet Take 1 tablet (0.5 mg total) by mouth at bedtime as needed for anxiety. 30 tablet 5  . FLUoxetine (PROZAC) 10 MG capsule Take 1 capsule (10 mg total) by mouth daily. 90 capsule 1  . lisinopril (PRINIVIL,ZESTRIL) 40 MG tablet Take 1 tablet (40 mg total) by mouth daily. 90 tablet 1  . metFORMIN (GLUCOPHAGE) 500 MG tablet take 1 tablet by mouth twice a day WITH A MEAL. 180 tablet 1  . Multiple Vitamin (MULTIVITAMIN WITH MINERALS) TABS Take 1 tablet  by mouth daily.    . simvastatin (ZOCOR) 20 MG tablet Take 1 tablet by mouth once daily 90 tablet 1   No current facility-administered medications on file prior to visit.     Allergies  Allergen Reactions  . Codeine     Family History  Problem Relation Age of Onset  . Ovarian cancer Mother   . Diabetes Mother   . Hypertension Mother   . Hyperlipidemia Mother   . Diabetes Father   . Hypertension Father   . Hyperlipidemia Father   . Hypertension Brother   . Hypertension Daughter   . Hypertension Daughter     Social History   Social History  . Marital status: Married    Spouse name: N/A  . Number of children: N/A  . Years of education: N/A   Occupational History  . 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 Smoking status: Current Every Day Smoker    Packs/day: 1.00    Years: 40.00    Types: Cigarettes  . Smokeless tobacco: Never Used  . Alcohol use No  . Drug use: No  . Sexual activity: Not on file   Other Topics Concern  . Not on file   Social History Narrative  . No narrative on file    Review of Systems  Constitutional: Negative.   Eyes: Negative.   Respiratory: Negative.   Cardiovascular: Negative.   Genitourinary: Negative.   Skin: Negative.   Neurological: Negative.   Psychiatric/Behavioral: Negative.   All other systems reviewed and are negative.   BP 126/72   Temp 98.6 F (37 C) (Oral)   Ht 4' 1.5" (1.257 m)   Wt 126 lb 12.8 oz (57.5 kg)   BMI 36.38 kg/m   Physical Exam  Constitutional: She is oriented to person, place, and time. No distress.  HENT:  Head: Normocephalic and atraumatic.  Right Ear: External ear normal.  Left Ear: External ear normal.  Nose: Nose normal.  Mouth/Throat: Oropharynx is clear and moist. No oropharyngeal exudate.  Eyes: Conjunctivae and EOM are normal. Pupils are equal, round, and reactive to light. Right eye exhibits no discharge. Left eye exhibits no discharge. No scleral icterus.    Neck: Normal range of motion. Neck supple. No JVD present. No tracheal deviation present. No thyromegaly present.  Cardiovascular: Normal rate, regular rhythm, normal heart sounds and intact distal pulses.  Exam reveals no gallop and no friction rub.   No murmur heard. Pulmonary/Chest: Effort normal and breath sounds normal. No stridor. No respiratory distress. She has no wheezes. She has no rales. She exhibits no tenderness.  Neurological: She is alert and oriented to person, place, and time. Gait normal. GCS score is 15.  Skin: Skin is warm and dry. No rash noted. She is not diaphoretic. No erythema.  Psychiatric: Mood, affect and judgment normal.  Nursing note and vitals reviewed.   Assessment/Plan: 1. Encounter to establish care - Follow up for CPE - Encouraged to quit smoking and if she needed any help to contact me - Continue to work on diet and exercise  2. Colon cancer screening - Ambulatory referral to Gastroenterology  3. Essential hypertension - Well controlled - No change   4. Controlled type 2 diabetes mellitus without complication, without long-term current use of insulin (HCC) - POC HgB A1c 6.3 - No change in medication  - Encouraged a diabetic diet and exercise  5. Estrogen deficiency - MM DIGITAL SCREENING BILATERAL; Future   [name], NP

## 2016-07-11 NOTE — Patient Instructions (Signed)
It was great meeting you today!  Your A1c is 6.3  Please follow up this fall for your physical   Someone will call you to schedule your mammogram and colonosocpy

## 2016-07-25 ENCOUNTER — Other Ambulatory Visit: Payer: Self-pay | Admitting: Adult Health

## 2016-07-25 DIAGNOSIS — Z1231 Encounter for screening mammogram for malignant neoplasm of breast: Secondary | ICD-10-CM

## 2016-08-03 ENCOUNTER — Ambulatory Visit
Admission: RE | Admit: 2016-08-03 | Discharge: 2016-08-03 | Disposition: A | Discharge status: Home / Self Care | Payer: PPO | Source: Ambulatory Visit | Attending: Adult Health | Admitting: Adult Health

## 2016-08-03 DIAGNOSIS — Z1231 Encounter for screening mammogram for malignant neoplasm of breast: Secondary | ICD-10-CM

## 2016-08-04 ENCOUNTER — Other Ambulatory Visit: Payer: Self-pay | Admitting: Adult Health

## 2016-08-04 DIAGNOSIS — R928 Other abnormal and inconclusive findings on diagnostic imaging of breast: Secondary | ICD-10-CM

## 2016-08-11 ENCOUNTER — Encounter: Payer: Self-pay | Admitting: Adult Health

## 2016-08-14 DIAGNOSIS — E119 Type 2 diabetes mellitus without complications: Secondary | ICD-10-CM | POA: Diagnosis not present

## 2016-08-14 DIAGNOSIS — I10 Essential (primary) hypertension: Secondary | ICD-10-CM | POA: Diagnosis not present

## 2016-08-14 DIAGNOSIS — Z1211 Encounter for screening for malignant neoplasm of colon: Secondary | ICD-10-CM | POA: Diagnosis not present

## 2016-08-17 ENCOUNTER — Ambulatory Visit
Admission: RE | Admit: 2016-08-17 | Discharge: 2016-08-17 | Disposition: A | Discharge status: Home / Self Care | Payer: PPO | Source: Ambulatory Visit | Attending: Adult Health | Admitting: Adult Health

## 2016-08-17 DIAGNOSIS — N6489 Other specified disorders of breast: Secondary | ICD-10-CM | POA: Diagnosis not present

## 2016-08-17 DIAGNOSIS — R928 Other abnormal and inconclusive findings on diagnostic imaging of breast: Secondary | ICD-10-CM

## 2016-08-29 DIAGNOSIS — D125 Benign neoplasm of sigmoid colon: Secondary | ICD-10-CM | POA: Diagnosis not present

## 2016-08-29 DIAGNOSIS — Z1211 Encounter for screening for malignant neoplasm of colon: Secondary | ICD-10-CM | POA: Diagnosis not present

## 2016-08-29 DIAGNOSIS — D123 Benign neoplasm of transverse colon: Secondary | ICD-10-CM | POA: Diagnosis not present

## 2016-08-29 DIAGNOSIS — K635 Polyp of colon: Secondary | ICD-10-CM | POA: Diagnosis not present

## 2016-09-28 ENCOUNTER — Encounter: Payer: PPO | Admitting: Adult Health

## 2016-09-28 ENCOUNTER — Other Ambulatory Visit (INDEPENDENT_AMBULATORY_CARE_PROVIDER_SITE_OTHER): Payer: PPO

## 2016-09-28 DIAGNOSIS — Z0000 Encounter for general adult medical examination without abnormal findings: Secondary | ICD-10-CM | POA: Diagnosis not present

## 2016-09-28 LAB — HEPATIC FUNCTION PANEL
ALBUMIN: 3.9 g/dL (ref 3.5–5.2)
ALT: 22 U/L (ref 0–35)
AST: 20 U/L (ref 0–37)
Alkaline Phosphatase: 70 U/L (ref 39–117)
BILIRUBIN DIRECT: 0.1 mg/dL (ref 0.0–0.3)
TOTAL PROTEIN: 6.3 g/dL (ref 6.0–8.3)
Total Bilirubin: 0.5 mg/dL (ref 0.2–1.2)

## 2016-09-28 LAB — LIPID PANEL
Cholesterol: 193 mg/dL (ref 0–200)
HDL: 63.8 mg/dL (ref >39.00)
LDL Cholesterol: 92 mg/dL (ref 0–99)
NonHDL: 129.37
TRIGLYCERIDES: 185 mg/dL — AB (ref 0.0–149.0)
Total CHOL/HDL Ratio: 3
VLDL: 37 mg/dL (ref 0.0–40.0)

## 2016-09-28 LAB — CBC WITH DIFFERENTIAL/PLATELET
BASOS PCT: 0.9 % (ref 0.0–3.0)
Basophils Absolute: 0.1 10*3/uL (ref 0.0–0.1)
EOS ABS: 0.3 10*3/uL (ref 0.0–0.7)
Eosinophils Relative: 4.2 % (ref 0.0–5.0)
HEMATOCRIT: 41.5 % (ref 36.0–46.0)
Hemoglobin: 13.8 g/dL (ref 12.0–15.0)
LYMPHS ABS: 2.9 10*3/uL (ref 0.7–4.0)
LYMPHS PCT: 41.9 % (ref 12.0–46.0)
MCHC: 33.2 g/dL (ref 30.0–36.0)
MCV: 86.1 fl (ref 78.0–100.0)
Monocytes Absolute: 0.3 10*3/uL (ref 0.1–1.0)
Monocytes Relative: 4.6 % (ref 3.0–12.0)
NEUTROS ABS: 3.3 10*3/uL (ref 1.4–7.7)
NEUTROS PCT: 48.4 % (ref 43.0–77.0)
PLATELETS: 279 10*3/uL (ref 150.0–400.0)
RBC: 4.82 Mil/uL (ref 3.87–5.11)
RDW: 14.5 % (ref 11.5–15.5)
WBC: 6.9 10*3/uL (ref 4.0–10.5)

## 2016-09-28 LAB — HEMOGLOBIN A1C: HEMOGLOBIN A1C: 6.2 % (ref 4.6–6.5)

## 2016-09-28 LAB — MICROALBUMIN / CREATININE URINE RATIO
CREATININE, U: 36.2 mg/dL
MICROALB UR: 26.3 mg/dL — AB (ref 0.0–1.9)
MICROALB/CREAT RATIO: 72.7 mg/g — AB (ref 0.0–30.0)

## 2016-09-28 LAB — BASIC METABOLIC PANEL
BUN: 22 mg/dL (ref 6–23)
CHLORIDE: 104 meq/L (ref 96–112)
CO2: 28 meq/L (ref 19–32)
CREATININE: 0.56 mg/dL (ref 0.40–1.20)
Calcium: 9.2 mg/dL (ref 8.4–10.5)
GFR: 114.43 mL/min (ref >60.00)
GLUCOSE: 117 mg/dL — AB (ref 70–99)
Potassium: 4.4 mEq/L (ref 3.5–5.1)
Sodium: 138 mEq/L (ref 135–145)

## 2016-09-28 LAB — POC URINALSYSI DIPSTICK (AUTOMATED)
Bilirubin, UA: NEGATIVE
GLUCOSE UA: NEGATIVE
Ketones, UA: NEGATIVE
Leukocytes, UA: NEGATIVE
NITRITE UA: NEGATIVE
Spec Grav, UA: 1.015
UROBILINOGEN UA: 0.2
pH, UA: 6.5

## 2016-09-28 LAB — TSH: TSH: 2.46 u[IU]/mL (ref 0.35–4.50)

## 2016-10-05 ENCOUNTER — Encounter: Payer: Self-pay | Admitting: Adult Health

## 2016-10-05 ENCOUNTER — Ambulatory Visit (INDEPENDENT_AMBULATORY_CARE_PROVIDER_SITE_OTHER): Payer: PPO | Admitting: Adult Health

## 2016-10-05 VITALS — BP 138/72 | Temp 98.6°F | Ht <= 58 in | Wt 131.5 lb

## 2016-10-05 DIAGNOSIS — E785 Hyperlipidemia, unspecified: Secondary | ICD-10-CM | POA: Diagnosis not present

## 2016-10-05 DIAGNOSIS — Z0000 Encounter for general adult medical examination without abnormal findings: Secondary | ICD-10-CM | POA: Diagnosis not present

## 2016-10-05 DIAGNOSIS — F17200 Nicotine dependence, unspecified, uncomplicated: Secondary | ICD-10-CM | POA: Diagnosis not present

## 2016-10-05 DIAGNOSIS — I10 Essential (primary) hypertension: Secondary | ICD-10-CM | POA: Diagnosis not present

## 2016-10-05 DIAGNOSIS — E119 Type 2 diabetes mellitus without complications: Secondary | ICD-10-CM

## 2016-10-05 DIAGNOSIS — Z760 Encounter for issue of repeat prescription: Secondary | ICD-10-CM

## 2016-10-05 DIAGNOSIS — Z23 Encounter for immunization: Secondary | ICD-10-CM

## 2016-10-05 MED ORDER — SIMVASTATIN 20 MG PO TABS: ORAL_TABLET | ORAL | 3 refills | Status: DC

## 2016-10-05 MED ORDER — FLUOXETINE HCL 10 MG PO CAPS: 10.0000 mg | ORAL_CAPSULE | Freq: Every day | ORAL | 1 refills | Status: DC

## 2016-10-05 MED ORDER — CLONAZEPAM 0.5 MG PO TABS: 0.5000 mg | ORAL_TABLET | Freq: Every evening | ORAL | 0 refills | Status: DC | PRN

## 2016-10-05 MED ORDER — AMLODIPINE BESYLATE 10 MG PO TABS: 10.0000 mg | ORAL_TABLET | Freq: Every day | ORAL | 3 refills | Status: DC

## 2016-10-05 MED ORDER — METFORMIN HCL 500 MG PO TABS: ORAL_TABLET | ORAL | 3 refills | Status: DC

## 2016-10-05 NOTE — Progress Notes (Signed)
Subjective:    Patient ID: Sophelia Legette, female    DOB: 10/05/1946, 70 y.o.   MRN: [id number]  HPI  Patient presents for yearly preventative medicine examination. She is a pleasant 70 female who  has a past medical history of Benign hematuria; Diabetes mellitus without complication ([place]); Esophageal dysmotility; Hyperlipidemia; Hypertension; Migraines; Osteoporosis; and Tobacco abuse.   All immunizations and health maintenance protocols were reviewed with the patient and needed orders were placed.  Medication reconciliation,  past medical history, social history, problem list and allergies were reviewed in detail with the patient  Goals were established with regard to weight loss, exercise, and  diet in compliance with medications. She tries to watch her diet but does not exercise on a consistent basis  End of life planning was discussed. She does not have an advanced directive or living will  Her diabetes is controlled with Metformin 500mg  She reports that her blood sugars at home have been " pretty good." She only checks them when she eats too much.   Her blood pressure is controlled with Norvasc 10 mg and lisinopril 40 mg  Hyperlipidemia is controlled with Simvastatin 40mg    She is up to date on her mammogram and dental care. She had her colonoscopy this year ( I do not have these records).   She continues to smoke and has no plans to quit   Review of Systems  Constitutional: Negative.   HENT: Negative.   Eyes: Negative.   Respiratory: Negative.   Cardiovascular: Negative.   Gastrointestinal: Negative.   Endocrine: Negative.   Genitourinary: Negative.   Musculoskeletal: Negative.   Skin: Negative.   Allergic/Immunologic: Negative.   Neurological: Negative.   Hematological: Negative.   Psychiatric/Behavioral: Negative.   All other systems reviewed and are negative.  Past Medical History:  Diagnosis Date  . Benign hematuria    neg urology workup  . Diabetes mellitus  without complication ([place])   . Esophageal dysmotility   . Hyperlipidemia   . Hypertension   . Migraines   . Osteoporosis   . Tobacco abuse     Social History   Social History  . Marital status: Married    Spouse name: N/A  . Number of children: N/A  . Years of education: N/A   Occupational History  . 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 Smoking status: Current Every Day Smoker    Packs/day: 1.00    Years: 40.00    Types: Cigarettes  . Smokeless tobacco: Never Used  . Alcohol use No  . Drug use: No  . Sexual activity: Not on file   Other Topics Concern  . Not on file   Social History Narrative   Owns a home    She is married    Two children           Past Surgical History:  Procedure Laterality Date  . ABDOMINAL HYSTERECTOMY      Family History  Problem Relation Age of Onset  . Ovarian cancer Mother   . Diabetes Mother   . Hypertension Mother   . Hyperlipidemia Mother   . Diabetes Father   . Hypertension Father   . Hyperlipidemia Father   . Hypertension Brother   . Hypertension Daughter   . Hypertension Daughter     Allergies  Allergen Reactions  . Codeine     Current Outpatient Prescriptions on File Prior to Visit  Medication Sig Dispense Refill  . amLODipine (  NORVASC) 10 MG tablet Take 1 tablet (10 mg total) by mouth daily. 90 tablet 1  . aspirin EC 81 MG tablet Take 81 mg by mouth daily.    . B Complex-C (B-COMPLEX WITH VITAMIN C) tablet Take 1 tablet by mouth daily.    . clonazePAM (KLONOPIN) 0.5 MG tablet Take 1 tablet (0.5 mg total) by mouth at bedtime as needed for anxiety. 30 tablet 5  . FLUoxetine (PROZAC) 10 MG capsule Take 1 capsule (10 mg total) by mouth daily. 90 capsule 1  . lisinopril (PRINIVIL,ZESTRIL) 40 MG tablet Take 1 tablet (40 mg total) by mouth daily. 90 tablet 1  . metFORMIN (GLUCOPHAGE) 500 MG tablet take 1 tablet by mouth twice a day WITH A MEAL. 180 tablet 1  . Multiple Vitamin (MULTIVITAMIN  WITH MINERALS) TABS Take 1 tablet by mouth daily.    . simvastatin (ZOCOR) 20 MG tablet Take 1 tablet by mouth once daily 90 tablet 1   No current facility-administered medications on file prior to visit.     BP 138/72   Temp 98.6 F (37 C) (Oral)   Ht 4' 1.5" (1.257 m)   Wt 131 lb 8 oz (59.6 kg)   BMI 37.73 kg/m       Objective:   Physical Exam  Constitutional: She is oriented to person, place, and time. She appears well-developed and well-nourished. No distress.  HENT:  Head: Normocephalic and atraumatic.  Right Ear: Hearing, tympanic membrane, external ear and ear canal normal.  Left Ear: Hearing, tympanic membrane, external ear and ear canal normal.  Nose: Nose normal.  Mouth/Throat: Oropharynx is clear and moist. No oropharyngeal exudate.  Eyes: Conjunctivae are normal. Right eye exhibits no discharge. Left eye exhibits no discharge.  Neck: Normal range of motion and full passive range of motion without pain. Neck supple. No JVD present. Carotid bruit is not present. No tracheal deviation present. No thyromegaly present.  Cardiovascular: Normal rate, regular rhythm, normal heart sounds and intact distal pulses.  Exam reveals no gallop and no friction rub.   No murmur heard. Pulmonary/Chest: Effort normal and breath sounds normal. No respiratory distress. She has no wheezes. She has no rales. She exhibits no tenderness.  Abdominal: Soft. Bowel sounds are normal. She exhibits no distension and no mass. There is no tenderness. There is no rebound and no guarding.  Musculoskeletal: Normal range of motion.  Lymphadenopathy:    She has no cervical adenopathy.  Neurological: She is alert and oriented to person, place, and time. She has normal strength. No cranial nerve deficit or sensory deficit. Coordination normal.  Skin: Skin is warm and dry. No rash noted. No erythema. No pallor.  Psychiatric: She has a normal mood and affect. Her behavior is normal. Judgment and thought content  normal.  Nursing note and vitals reviewed.    Assessment & Plan:  1. Routine general medical examination at a health care facility - Reviewed labs in detail with patient and all questions answered.  - Educated on the importance of following a diabetic diet and frequent exercise - I would like her to quit smoking - Follow up in one year for next CPE or sooner if needed  2. Essential hypertension - Near goal  - EKG 12-Lead- Sinus Rhythm, Rate 76 - Diet and exercise - No change in medications at this time  3. Controlled type 2 diabetes mellitus without complication, without long-term current use of insulin (HCC) - A1c 6.2.  - EKG 12-Lead - Controlled on  Metformin.  - No change at this time - Follow up in 6 months 4. TOBACCO ABUSE - She does not want to quit smoking - EKG 12-Lead  5. Hyperlipidemia, unspecified hyperlipidemia type - Triglycerides have increased but she is controlled for the most part with simvastain. Continue current dose - EKG 12-Lead - Start eating a diabetic diet - Frequent exercise  6. Medication refill  - clonazePAM (KLONOPIN) 0.5 MG tablet; Take 1 tablet (0.5 mg total) by mouth at bedtime as needed for anxiety.  Dispense: 30 tablet; Refill: 0 - metFORMIN (GLUCOPHAGE) 500 MG tablet; take 1 tablet by mouth twice a day WITH A MEAL.  Dispense: 180 tablet; Refill: 3 - amLODipine (NORVASC) 10 MG tablet; Take 1 tablet (10 mg total) by mouth daily.  Dispense: 90 tablet; Refill: 3 - simvastatin (ZOCOR) 20 MG tablet; Take 1 tablet by mouth once daily  Dispense: 90 tablet; Refill: 3 - FLUoxetine (PROZAC) 10 MG capsule; Take 1 capsule (10 mg total) by mouth daily.  Dispense: 90 capsule; Refill: 1  7. Need for prophylactic vaccination and inoculation against influenza - Flu vaccine HIGH DOSE PF (Fluzone High dose)   [name], NP

## 2016-10-05 NOTE — Patient Instructions (Signed)
It was great seeing you today!  Your exam was great!   Please work on quitting smoking.   I have sent in all your prescriptions.   Please follow up with me in 6 months for a diabetes check. If you need anything, then please let me know

## 2017-03-15 ENCOUNTER — Encounter: Payer: Self-pay | Admitting: Adult Health

## 2017-03-15 ENCOUNTER — Other Ambulatory Visit: Payer: Self-pay | Admitting: Adult Health

## 2017-03-15 ENCOUNTER — Telehealth: Payer: Self-pay | Admitting: Adult Health

## 2017-03-15 ENCOUNTER — Other Ambulatory Visit: Payer: Self-pay | Admitting: Family Medicine

## 2017-03-15 DIAGNOSIS — Z760 Encounter for issue of repeat prescription: Secondary | ICD-10-CM

## 2017-03-15 MED ORDER — FLUOXETINE HCL 10 MG PO CAPS: 10.0000 mg | ORAL_CAPSULE | Freq: Every day | ORAL | 1 refills | Status: DC

## 2017-03-15 MED ORDER — CLONAZEPAM 0.5 MG PO TABS: 0.5000 mg | ORAL_TABLET | Freq: Every evening | ORAL | 0 refills | Status: DC | PRN

## 2017-03-15 NOTE — Telephone Encounter (Signed)
° °  Pt request refill of the following:  amLODipine (NORVASC) 10 MG tablet  FLUoxetine (PROZAC) 10 MG capsule     Phamacy:  [facility]

## 2017-03-15 NOTE — Telephone Encounter (Signed)
Amlodipine not due until 10-05-2017.  Fluoxetine was refilled e-scribe for 6 months.

## 2017-03-16 NOTE — Telephone Encounter (Signed)
Ok to refill for 1 year 

## 2017-04-04 ENCOUNTER — Ambulatory Visit (INDEPENDENT_AMBULATORY_CARE_PROVIDER_SITE_OTHER): Payer: PPO | Admitting: Adult Health

## 2017-04-04 ENCOUNTER — Encounter: Payer: Self-pay | Admitting: Adult Health

## 2017-04-04 VITALS — BP 150/74 | Temp 98.5°F | Ht <= 58 in | Wt 126.8 lb

## 2017-04-04 DIAGNOSIS — I10 Essential (primary) hypertension: Secondary | ICD-10-CM | POA: Diagnosis not present

## 2017-04-04 DIAGNOSIS — E119 Type 2 diabetes mellitus without complications: Secondary | ICD-10-CM

## 2017-04-04 LAB — POCT GLYCOSYLATED HEMOGLOBIN (HGB A1C): HEMOGLOBIN A1C: 6.1

## 2017-04-04 NOTE — Progress Notes (Signed)
Subjective:    Patient ID: Shelly Clark, female    DOB: 03/16/1946, 71 y.o.   MRN: [id number]  HPI  71 year old female who  has a past medical history of Benign hematuria; Diabetes mellitus without complication ([place]); Esophageal dysmotility; Hyperlipidemia; Hypertension; Migraines; Osteoporosis; and Tobacco abuse.  She presents to the office today for follow up regarding diabetes and hypertension   Her last A1c was 6.2 - 6 months ago. She is currently taking Metformin 500mg  BID for diabetes control.   Lab Results  Component Value Date   HGBA1C 6.2 09/28/2016   She is taking Norvasc 10 mg and lisinopril 40 mg daily for blood pressure control. She did not take her blood pressure medication today   BP Readings from Last 3 Encounters:  04/04/17 (!) 150/74  10/05/16 138/72  07/11/16 126/72   She has no acute complaints today   Review of Systems See HPI   Past Medical History:  Diagnosis Date  . Benign hematuria    neg urology workup  . Diabetes mellitus without complication ([place])   . Esophageal dysmotility   . Hyperlipidemia   . Hypertension   . Migraines   . Osteoporosis   . Tobacco abuse     Social History   Social History  . Marital status: Married    Spouse name: N/A  . Number of children: N/A  . Years of education: N/A   Occupational History  . 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 Smoking status: Current Every Day Smoker    Packs/day: 1.00    Years: 40.00    Types: Cigarettes  . Smokeless tobacco: Never Used  . Alcohol use No  . Drug use: No  . Sexual activity: Not on file   Other Topics Concern  . Not on file   Social History Narrative   Owns a home    She is married    Two children           Past Surgical History:  Procedure Laterality Date  . ABDOMINAL HYSTERECTOMY      Family History  Problem Relation Age of Onset  . Ovarian cancer Mother   . Diabetes Mother   . Hypertension Mother   . Hyperlipidemia  Mother   . Diabetes Father   . Hypertension Father   . Hyperlipidemia Father   . Hypertension Brother   . Hypertension Daughter   . Hypertension Daughter     Allergies  Allergen Reactions  . Codeine     Current Outpatient Prescriptions on File Prior to Visit  Medication Sig Dispense Refill  . amLODipine (NORVASC) 10 MG tablet Take 1 tablet (10 mg total) by mouth daily. 90 tablet 3  . aspirin EC 81 MG tablet Take 81 mg by mouth daily.    . B Complex-C (B-COMPLEX WITH VITAMIN C) tablet Take 1 tablet by mouth daily.    . clonazePAM (KLONOPIN) 0.5 MG tablet Take 1 tablet (0.5 mg total) by mouth at bedtime as needed for anxiety. 30 tablet 0  . FLUoxetine (PROZAC) 10 MG capsule Take 1 capsule (10 mg total) by mouth daily. 90 capsule 1  . lisinopril (PRINIVIL,ZESTRIL) 40 MG tablet TAKE 1 TABLET BY MOUTH ONCE DAILY 90 tablet 3  . metFORMIN (GLUCOPHAGE) 500 MG tablet take 1 tablet by mouth twice a day WITH A MEAL. 180 tablet 3  . Multiple Vitamin (MULTIVITAMIN WITH MINERALS) TABS Take 1 tablet by mouth daily.    Marland Kitchen  simvastatin (ZOCOR) 20 MG tablet Take 1 tablet by mouth once daily 90 tablet 3   No current facility-administered medications on file prior to visit.     BP (!) 150/74 (BP Location: Left Arm, Patient Position: Sitting, Cuff Size: Normal)   Temp 98.5 F (36.9 C) (Oral)   Ht 4' 1.5" (1.257 m)   Wt 126 lb 12.8 oz (57.5 kg)   BMI 36.38 kg/m       Objective:   Physical Exam  Constitutional: She is oriented to person, place, and time. She appears well-developed and well-nourished. No distress.  Cardiovascular: Normal rate, regular rhythm, normal heart sounds and intact distal pulses.  Exam reveals no gallop and no friction rub.   No murmur heard. Pulmonary/Chest: Effort normal and breath sounds normal. No respiratory distress. She has no wheezes. She has no rales. She exhibits no tenderness.  Musculoskeletal: Normal range of motion. She exhibits no edema, tenderness or  deformity.  Neurological: She is alert and oriented to person, place, and time.  Skin: Skin is warm and dry. No rash noted. She is not diaphoretic. No erythema. No pallor.  Psychiatric: She has a normal mood and affect. Her behavior is normal. Judgment and thought content normal.  Nursing note and vitals reviewed.     Assessment & Plan:  1. Controlled type 2 diabetes mellitus without complication, without long-term current use of insulin (HCC)  - POC HgB A1c- 6.1  - Well controlled.  - No change in medications - Follow up in 6 months   2. Essential hypertension - Has been controlled in the past when taking medication  - No change  - Follow up in 6 months  [name], NP

## 2017-06-14 ENCOUNTER — Other Ambulatory Visit: Payer: Self-pay | Admitting: Adult Health

## 2017-06-14 DIAGNOSIS — Z760 Encounter for issue of repeat prescription: Secondary | ICD-10-CM

## 2017-06-14 NOTE — Telephone Encounter (Signed)
Ok to refill for 30 days  

## 2017-08-17 ENCOUNTER — Encounter: Payer: Self-pay | Admitting: Adult Health

## 2017-09-13 ENCOUNTER — Other Ambulatory Visit: Payer: Self-pay | Admitting: Adult Health

## 2017-09-13 DIAGNOSIS — Z760 Encounter for issue of repeat prescription: Secondary | ICD-10-CM

## 2017-09-13 NOTE — Telephone Encounter (Signed)
Prozac for 6 months   Klonopin for 30 days

## 2017-09-14 NOTE — Telephone Encounter (Signed)
Called to the pharmacy and left on machine. 

## 2017-10-10 ENCOUNTER — Ambulatory Visit: Payer: PPO | Admitting: Adult Health

## 2017-10-10 NOTE — Progress Notes (Deleted)
Subjective:    Patient ID: Shelly Clark, female    DOB: [birth year], 71 y.o.   MRN: [id number]  HPI  Patient presents for yearly follow up exam. She is a pleasant 71 year old asian female who  has a past medical history of Benign hematuria, Diabetes mellitus without complication ([place]), Esophageal dysmotility, Hyperlipidemia, Hypertension, Migraines, Osteoporosis, and Tobacco abuse.  Her diabetes is controlled with Metformin 500mg  She reports that her blood sugars at home have been " pretty good." She only checks them when she eats too much.  Lab Results  Component Value Date   HGBA1C 6.1 04/04/2017     Her blood pressure is controlled with Norvasc 10 mg and lisinopril 40 mg  Hyperlipidemia is controlled with Simvastatin 40mg   Lab Results  Component Value Date   CHOL 193 09/28/2016   HDL 63.80 09/28/2016   LDLCALC 92 09/28/2016   LDLDIRECT 118.3 03/07/2013   TRIG 185.0 (H) 09/28/2016   CHOLHDL 3 09/28/2016    All immunizations and health maintenance protocols were reviewed with the patient and needed orders were placed.  Appropriate screening laboratory values were ordered for the patient including screening of hyperlipidemia, renal function and hepatic function.  Medication reconciliation,  past medical history, social history, problem list and allergies were reviewed in detail with the patient  Goals were established with regard to weight loss, exercise, and  diet in compliance with medications  End of life planning was discussed.  She continues to smoke and has no expectations to quit.   She is up to date on her colonoscopy. During her last mammogram her report read as "Right breast probably benign asymmetry, seen on baseline screening mammography, for which six-month follow-up is recommended." She never did follow up on this.    Review of Systems  Constitutional: Negative.   HENT: Negative.   Eyes: Negative.   Respiratory: Negative.   Cardiovascular: Negative.    Gastrointestinal: Negative.   Endocrine: Negative.   Genitourinary: Negative.   Musculoskeletal: Negative.   Skin: Negative.   Allergic/Immunologic: Negative.   Neurological: Negative.   Hematological: Negative.   Psychiatric/Behavioral: Negative.   All other systems reviewed and are negative.  Past Medical History:  Diagnosis Date  . Benign hematuria    neg urology workup  . Diabetes mellitus without complication ([place])   . Esophageal dysmotility   . Hyperlipidemia   . Hypertension   . Migraines   . Osteoporosis   . Tobacco abuse     Social History   Socioeconomic History  . Marital status: Married    Spouse name: Not on file  . Number of children: Not on file  . Years of education: Not on file  . Highest education level: Not on file  Social Needs  . Financial resource strain: Not on file  . Food insecurity - worry: Not on file  . Food insecurity - inability: Not on file  . Transportation needs - medical: Not on file  . Transportation needs - non-medical: Not on file  Occupational History  . 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 Smoking status: Current Every Day Smoker    Packs/day: 1.00    Years: 40.00    Pack years: 40.00    Types: Cigarettes  . Smokeless tobacco: Never Used  Substance and Sexual Activity  . Alcohol use: No  . Drug use: No  . Sexual activity: Not on file  Other Topics Concern  .  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 ABDOMINAL HYSTERECTOMY      Family History  Problem Relation Age of Onset  . Ovarian cancer Mother   . Diabetes Mother   . Hypertension Mother   . Hyperlipidemia Mother   . Diabetes Father   . Hypertension Father   . Hyperlipidemia Father   . Hypertension Brother   . Hypertension Daughter   . Hypertension Daughter     Allergies  Allergen Reactions  .  Codeine     Current Outpatient Medications on File Prior to Visit  Medication Sig Dispense Refill  . amLODipine (NORVASC) 10 MG tablet Take 1 tablet (10 mg total) by mouth daily. 90 tablet 3  . aspirin EC 81 MG tablet Take 81 mg by mouth daily.    . B Complex-C (B-COMPLEX WITH VITAMIN C) tablet Take 1 tablet by mouth daily.    . clonazePAM (KLONOPIN) 0.5 MG tablet TAKE 1 TABLET BY MOUTH EVERY NIGHT AT BEDTIME AS NEEDED FOR SLEEP 30 tablet 0  . FLUoxetine (PROZAC) 10 MG capsule TAKE 1 CAPSULE(10 MG) BY MOUTH DAILY 90 capsule 1  . lisinopril (PRINIVIL,ZESTRIL) 40 MG tablet TAKE 1 TABLET BY MOUTH ONCE DAILY 90 tablet 3  . metFORMIN (GLUCOPHAGE) 500 MG tablet take 1 tablet by mouth twice a day WITH A MEAL. 180 tablet 3  . Multiple Vitamin (MULTIVITAMIN WITH MINERALS) TABS Take 1 tablet by mouth daily.    . simvastatin (ZOCOR) 20 MG tablet Take 1 tablet by mouth once daily 90 tablet 3   No current facility-administered medications on file prior to visit.     There were no vitals taken for this visit.      Objective:   Physical Exam  Constitutional: She is oriented to person, place, and time. She appears well-developed and well-nourished. No distress.  HENT:  Head: Normocephalic and atraumatic.  Right Ear: External ear normal.  Left Ear: External ear normal.  Nose: Nose normal.  Mouth/Throat: Oropharynx is clear and moist. No oropharyngeal exudate.  Eyes: Conjunctivae and EOM are normal. Right eye exhibits no discharge. Left eye exhibits no discharge. No scleral icterus.  Neck: Normal range of motion. Neck supple. No JVD present. No tracheal deviation present. No thyromegaly present.  Cardiovascular: Normal rate, regular rhythm, normal heart sounds and intact distal pulses. Exam reveals no gallop and no friction rub.  No murmur heard. Pulmonary/Chest: Effort normal and breath sounds normal. No stridor. No respiratory distress. She has no wheezes. She has no rales. She exhibits no  tenderness.  Abdominal: Soft. Bowel sounds are normal. She exhibits no distension and no mass. There is no tenderness. There is no rebound and no guarding.  Musculoskeletal: Normal range of motion. She exhibits no edema, tenderness or deformity.  Lymphadenopathy:    She has no cervical adenopathy.  Neurological: She is alert and oriented to person, place, and time. She has normal reflexes. She displays normal reflexes. No cranial nerve deficit. She exhibits normal muscle tone. Coordination normal.  Skin: Skin is warm and dry. No rash noted. She is not diaphoretic. No erythema. No pallor.  Psychiatric: She has a normal mood and affect. Her behavior is normal. Judgment and thought content normal.  Nursing note and vitals reviewed.      Assessment & Plan:

## 2017-10-26 ENCOUNTER — Ambulatory Visit (INDEPENDENT_AMBULATORY_CARE_PROVIDER_SITE_OTHER): Payer: PPO | Admitting: Adult Health

## 2017-10-26 ENCOUNTER — Encounter: Payer: Self-pay | Admitting: Adult Health

## 2017-10-26 VITALS — BP 146/70 | Temp 98.4°F | Ht 59.0 in | Wt 127.0 lb

## 2017-10-26 DIAGNOSIS — E119 Type 2 diabetes mellitus without complications: Secondary | ICD-10-CM

## 2017-10-26 DIAGNOSIS — Z760 Encounter for issue of repeat prescription: Secondary | ICD-10-CM

## 2017-10-26 DIAGNOSIS — F4323 Adjustment disorder with mixed anxiety and depressed mood: Secondary | ICD-10-CM

## 2017-10-26 DIAGNOSIS — F17200 Nicotine dependence, unspecified, uncomplicated: Secondary | ICD-10-CM

## 2017-10-26 DIAGNOSIS — I10 Essential (primary) hypertension: Secondary | ICD-10-CM

## 2017-10-26 DIAGNOSIS — E785 Hyperlipidemia, unspecified: Secondary | ICD-10-CM

## 2017-10-26 DIAGNOSIS — Z23 Encounter for immunization: Secondary | ICD-10-CM

## 2017-10-26 DIAGNOSIS — Z0000 Encounter for general adult medical examination without abnormal findings: Secondary | ICD-10-CM | POA: Diagnosis not present

## 2017-10-26 LAB — BASIC METABOLIC PANEL
BUN: 22 mg/dL (ref 6–23)
CALCIUM: 9.3 mg/dL (ref 8.4–10.5)
CHLORIDE: 107 meq/L (ref 96–112)
CO2: 27 meq/L (ref 19–32)
Creatinine, Ser: 0.57 mg/dL (ref 0.40–1.20)
GFR: 111.75 mL/min (ref >60.00)
GLUCOSE: 114 mg/dL — AB (ref 70–99)
POTASSIUM: 4.4 meq/L (ref 3.5–5.1)
SODIUM: 140 meq/L (ref 135–145)

## 2017-10-26 LAB — HEPATIC FUNCTION PANEL
ALT: 18 U/L (ref 0–35)
AST: 23 U/L (ref 0–37)
Albumin: 3.4 g/dL — ABNORMAL LOW (ref 3.5–5.2)
Alkaline Phosphatase: 57 U/L (ref 39–117)
BILIRUBIN DIRECT: 0.1 mg/dL (ref 0.0–0.3)
BILIRUBIN TOTAL: 0.7 mg/dL (ref 0.2–1.2)
Total Protein: 5.7 g/dL — ABNORMAL LOW (ref 6.0–8.3)

## 2017-10-26 LAB — CBC WITH DIFFERENTIAL/PLATELET
BASOS ABS: 0.1 10*3/uL (ref 0.0–0.1)
BASOS PCT: 1 % (ref 0.0–3.0)
EOS PCT: 1.8 % (ref 0.0–5.0)
Eosinophils Absolute: 0.1 10*3/uL (ref 0.0–0.7)
HEMATOCRIT: 42.1 % (ref 36.0–46.0)
Hemoglobin: 13.7 g/dL (ref 12.0–15.0)
LYMPHS PCT: 47.4 % — AB (ref 12.0–46.0)
Lymphs Abs: 3 10*3/uL (ref 0.7–4.0)
MCHC: 32.6 g/dL (ref 30.0–36.0)
MCV: 87.4 fl (ref 78.0–100.0)
MONOS PCT: 3.8 % (ref 3.0–12.0)
Monocytes Absolute: 0.2 10*3/uL (ref 0.1–1.0)
NEUTROS ABS: 2.9 10*3/uL (ref 1.4–7.7)
Neutrophils Relative %: 46 % (ref 43.0–77.0)
PLATELETS: 315 10*3/uL (ref 150.0–400.0)
RBC: 4.82 Mil/uL (ref 3.87–5.11)
RDW: 14.7 % (ref 11.5–15.5)
WBC: 6.2 10*3/uL (ref 4.0–10.5)

## 2017-10-26 LAB — LIPID PANEL
CHOL/HDL RATIO: 4
Cholesterol: 256 mg/dL — ABNORMAL HIGH (ref 0–200)
HDL: 66.7 mg/dL (ref >39.00)
LDL CALC: 153 mg/dL — AB (ref 0–99)
NonHDL: 189.41
TRIGLYCERIDES: 180 mg/dL — AB (ref 0.0–149.0)
VLDL: 36 mg/dL (ref 0.0–40.0)

## 2017-10-26 LAB — HEMOGLOBIN A1C: HEMOGLOBIN A1C: 6.3 % (ref 4.6–6.5)

## 2017-10-26 LAB — TSH: TSH: 2.26 u[IU]/mL (ref 0.35–4.50)

## 2017-10-26 MED ORDER — LISINOPRIL 40 MG PO TABS: 40.0000 mg | ORAL_TABLET | Freq: Every day | ORAL | 3 refills | Status: DC

## 2017-10-26 MED ORDER — AMLODIPINE BESYLATE 10 MG PO TABS: 10.0000 mg | ORAL_TABLET | Freq: Every day | ORAL | 3 refills | Status: DC

## 2017-10-26 MED ORDER — CLONAZEPAM 0.5 MG PO TABS: 0.5000 mg | ORAL_TABLET | Freq: Every evening | ORAL | 0 refills | Status: DC | PRN

## 2017-10-26 MED ORDER — FLUOXETINE HCL 10 MG PO CAPS: ORAL_CAPSULE | ORAL | 1 refills | Status: DC

## 2017-10-26 MED ORDER — SIMVASTATIN 20 MG PO TABS: ORAL_TABLET | ORAL | 3 refills | Status: DC

## 2017-10-26 MED ORDER — METFORMIN HCL 500 MG PO TABS: ORAL_TABLET | ORAL | 3 refills | Status: DC

## 2017-10-26 NOTE — Progress Notes (Signed)
Subjective:    Patient ID: Shelly Clark, female    DOB: 1946-09-21, 71 y.o.   MRN: [id number]  HPI Patient presents for yearly preventative medicine examination. She is a pleasant 72 year old female who  has a past medical history of Benign hematuria, Diabetes mellitus without complication ([place]), Esophageal dysmotility, Hyperlipidemia, Hypertension, Migraines, Osteoporosis, and Tobacco abuse.  She takes Norvasc 10 mg and lisinopril 40 mg for hypertension. She has not taken her medications yet this morning. BP: (!) 146/70  She takes Metformin 500 mg BID for diabetes  Lab Results  Component Value Date   HGBA1C 6.1 04/04/2017   She takes klonopin and prozac for anxiety   She takes Zocor and ASA for hyperlipidemia   All immunizations and health maintenance protocols were reviewed with the patient and needed orders were placed. She is due for her flu shot and pneumovax   Appropriate screening laboratory values were ordered for the patient including screening of hyperlipidemia, renal function and hepatic function.  Medication reconciliation,  past medical history, social history, problem list and allergies were reviewed in detail with the patient  Goals were established with regard to weight loss, exercise, and  diet in compliance with medications  End of life planning was discussed.  She had her colonoscopy last year and had polyps. She is due in three years. She is do for her mammogram and will call to schedule this appointment.   Unfortunately she continues to smoke and does not want to quit at this time.   She has no acute concerns today    Review of Systems  Constitutional: Negative.   HENT: Negative.   Eyes: Negative.   Respiratory: Negative.   Cardiovascular: Negative.   Gastrointestinal: Negative.   Endocrine: Negative.   Genitourinary: Negative.   Musculoskeletal: Negative.   Skin: Negative.   Allergic/Immunologic: Negative.   Neurological: Negative.     Hematological: Negative.   Psychiatric/Behavioral: Negative.   All other systems reviewed and are negative.     Past Medical History:  Diagnosis Date  . Benign hematuria    neg urology workup  . Diabetes mellitus without complication ([place])   . Esophageal dysmotility   . Hyperlipidemia   . Hypertension   . Migraines   . Osteoporosis   . Tobacco abuse     Social History   Socioeconomic History  . Marital status: Married    Spouse name: Not on file  . Number of children: Not on file  . Years of education: Not on file  . Highest education level: Not on file  Social Needs  . Financial resource strain: Not on file  . Food insecurity - worry: Not on file  . Food insecurity - inability: Not on file  . Transportation needs - medical: Not on file  . Transportation needs - non-medical: Not on file  Occupational History  . 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 Smoking status: Current Every Day Smoker    Packs/day: 1.00    Years: 40.00    Pack years: 40.00    Types: Cigarettes  . Smokeless tobacco: Never Used  Substance and Sexual Activity  . Alcohol use: No  . Drug use: No  . Sexual activity: Not on file  Other Topics Concern  . Not on file  Social History Narrative   Owns a home    She is married    Two children  Past Surgical History:  Procedure Laterality Date  . ABDOMINAL HYSTERECTOMY      Family History  Problem Relation Age of Onset  . Ovarian cancer Mother   . Diabetes Mother   . Hypertension Mother   . Hyperlipidemia Mother   . Diabetes Father   . Hypertension Father   . Hyperlipidemia Father   . Hypertension Brother   . Hypertension Daughter   . Hypertension Daughter     Allergies  Allergen Reactions  . Codeine     Current Outpatient Medications on File Prior to Visit  Medication Sig Dispense Refill  . amLODipine (NORVASC) 10 MG tablet Take 1 tablet (10 mg total) by mouth daily. 90 tablet 3   . aspirin EC 81 MG tablet Take 81 mg by mouth daily.    . B Complex-C (B-COMPLEX WITH VITAMIN C) tablet Take 1 tablet by mouth daily.    . clonazePAM (KLONOPIN) 0.5 MG tablet TAKE 1 TABLET BY MOUTH EVERY NIGHT AT BEDTIME AS NEEDED FOR SLEEP 30 tablet 0  . FLUoxetine (PROZAC) 10 MG capsule TAKE 1 CAPSULE(10 MG) BY MOUTH DAILY 90 capsule 1  . lisinopril (PRINIVIL,ZESTRIL) 40 MG tablet TAKE 1 TABLET BY MOUTH ONCE DAILY 90 tablet 3  . metFORMIN (GLUCOPHAGE) 500 MG tablet take 1 tablet by mouth twice a day WITH A MEAL. 180 tablet 3  . Multiple Vitamin (MULTIVITAMIN WITH MINERALS) TABS Take 1 tablet by mouth daily.    . simvastatin (ZOCOR) 20 MG tablet Take 1 tablet by mouth once daily 90 tablet 3   No current facility-administered medications on file prior to visit.     BP (!) 146/70 (BP Location: Left Arm)   Temp 98.4 F (36.9 C) (Oral)   Ht 4\' 11"  (4.010 m)   Wt 127 lb (57.6 kg)   BMI 25.65 kg/m        Objective:   Physical Exam  Constitutional: She is oriented to person, place, and time. She appears well-developed and well-nourished. No distress.  HENT:  Head: Normocephalic and atraumatic.  Right Ear: External ear normal.  Left Ear: External ear normal.  Nose: Nose normal.  Mouth/Throat: Oropharynx is clear and moist. No oropharyngeal exudate.  Eyes: Conjunctivae and EOM are normal. Pupils are equal, round, and reactive to light. Right eye exhibits no discharge. Left eye exhibits no discharge. No scleral icterus.  Neck: Normal range of motion. Neck supple. No JVD present. Carotid bruit is not present. No tracheal deviation present. No thyromegaly present.  Cardiovascular: Normal rate, regular rhythm, normal heart sounds and intact distal pulses. Exam reveals no gallop and no friction rub.  No murmur heard. Pulmonary/Chest: Effort normal and breath sounds normal. No stridor. No respiratory distress. She has no wheezes. She has no rales. She exhibits no tenderness.  Abdominal:  Soft. Bowel sounds are normal. She exhibits no distension and no mass. There is no tenderness. There is no rebound and no guarding.  Genitourinary:  Genitourinary Comments: Refused breast exam    Musculoskeletal: Normal range of motion. She exhibits no edema, tenderness or deformity.  Lymphadenopathy:    She has no cervical adenopathy.  Neurological: She is alert and oriented to person, place, and time. She has normal reflexes. She displays normal reflexes. No cranial nerve deficit. She exhibits normal muscle tone. Coordination normal.  Skin: Skin is warm and dry. No rash noted. She is not diaphoretic. No erythema. No pallor.  Psychiatric: She has a normal mood and affect. Her behavior is normal. Judgment and thought  content normal.  Nursing note and vitals reviewed.     Assessment & Plan:  1. Routine general medical examination at a health care facility - Basic metabolic panel - CBC with Differential/Platelet - Hemoglobin A1c - Hepatic function panel - Lipid panel - TSH  2. Medication refill  - amLODipine (NORVASC) 10 MG tablet; Take 1 tablet (10 mg total) by mouth daily.  Dispense: 90 tablet; Refill: 3 - clonazePAM (KLONOPIN) 0.5 MG tablet; Take 1 tablet (0.5 mg total) by mouth at bedtime as needed. for sleep  Dispense: 30 tablet; Refill: 0 - FLUoxetine (PROZAC) 10 MG capsule; TAKE 1 CAPSULE(10 MG) BY MOUTH DAILY  Dispense: 90 capsule; Refill: 1 - lisinopril (PRINIVIL,ZESTRIL) 40 MG tablet; Take 1 tablet (40 mg total) by mouth daily.  Dispense: 90 tablet; Refill: 3 - metFORMIN (GLUCOPHAGE) 500 MG tablet; take 1 tablet by mouth twice a day WITH A MEAL.  Dispense: 180 tablet; Refill: 3 - simvastatin (ZOCOR) 20 MG tablet; Take 1 tablet by mouth once daily  Dispense: 90 tablet; Refill: 3  3. Essential hypertension - Advised to monitor her BP at home and report readings back  - amLODipine (NORVASC) 10 MG tablet; Take 1 tablet (10 mg total) by mouth daily.  Dispense: 90 tablet; Refill:  3 - lisinopril (PRINIVIL,ZESTRIL) 40 MG tablet; Take 1 tablet (40 mg total) by mouth daily.  Dispense: 90 tablet; Refill: 3 - Basic metabolic panel - CBC with Differential/Platelet - Hemoglobin A1c - Hepatic function panel - Lipid panel - TSH  4. Controlled type 2 diabetes mellitus without complication, without long-term current use of insulin ([place]) - Controlled in the past. Consider increasing dose  - metFORMIN (GLUCOPHAGE) 500 MG tablet; take 1 tablet by mouth twice a day WITH A MEAL.  Dispense: 180 tablet; Refill: 3 - Basic metabolic panel - CBC with Differential/Platelet - Hemoglobin A1c - Hepatic function panel - Lipid panel - TSH  5. Hyperlipidemia, unspecified hyperlipidemia type - simvastatin (ZOCOR) 20 MG tablet; Take 1 tablet by mouth once daily  Dispense: 90 tablet; Refill: 3 - Basic metabolic panel - CBC with Differential/Platelet - Hemoglobin A1c - Hepatic function panel - Lipid panel - TSH  6. TOBACCO ABUSE - Encouraged to quit smoking   7. Adjustment disorder with mixed anxiety and depressed mood  - clonazePAM (KLONOPIN) 0.5 MG tablet; Take 1 tablet (0.5 mg total) by mouth at bedtime as needed. for sleep  Dispense: 30 tablet; Refill: 0 - FLUoxetine (PROZAC) 10 MG capsule; TAKE 1 CAPSULE(10 MG) BY MOUTH DAILY  Dispense: 90 capsule; Refill: 1  8. Need for influenza vaccination  - Flu vaccine HIGH DOSE PF (Fluzone High dose)  9. Need for vaccination against Streptococcus pneumoniae  - Pneumococcal polysaccharide vaccine 23-valent greater than or equal to 2yo subcutaneous/IM  [name], NP

## 2017-10-26 NOTE — Patient Instructions (Signed)
It was great seeing you today!   I have sent in your medications and will follow up with you regarding your blood work   Please quit smoking!   I will see you in 6 months or sooner if needed

## 2018-03-12 ENCOUNTER — Ambulatory Visit: Payer: PPO | Admitting: Family Medicine

## 2018-05-02 ENCOUNTER — Ambulatory Visit: Payer: PPO | Admitting: Adult Health

## 2018-05-20 ENCOUNTER — Other Ambulatory Visit: Payer: Self-pay | Admitting: Adult Health

## 2018-05-20 ENCOUNTER — Telehealth: Payer: Self-pay | Admitting: Adult Health

## 2018-05-20 DIAGNOSIS — Z760 Encounter for issue of repeat prescription: Secondary | ICD-10-CM

## 2018-05-20 DIAGNOSIS — F4323 Adjustment disorder with mixed anxiety and depressed mood: Secondary | ICD-10-CM

## 2018-05-20 MED ORDER — CLONAZEPAM 0.5 MG PO TABS: 0.5000 mg | ORAL_TABLET | Freq: Every evening | ORAL | 0 refills | Status: DC | PRN

## 2018-05-20 NOTE — Telephone Encounter (Signed)
Spoke to patient and she informed me that she is taking a flight to [place]. She would like to have a refill of Clonazepam for the flight

## 2018-10-04 ENCOUNTER — Encounter: Payer: Self-pay | Admitting: Adult Health

## 2018-10-29 ENCOUNTER — Encounter: Payer: Self-pay | Admitting: Adult Health

## 2018-10-29 ENCOUNTER — Ambulatory Visit (INDEPENDENT_AMBULATORY_CARE_PROVIDER_SITE_OTHER): Payer: PPO | Admitting: Adult Health

## 2018-10-29 VITALS — BP 155/80 | Temp 98.5°F | Wt 127.0 lb

## 2018-10-29 DIAGNOSIS — Z1159 Encounter for screening for other viral diseases: Secondary | ICD-10-CM | POA: Diagnosis not present

## 2018-10-29 DIAGNOSIS — Z0000 Encounter for general adult medical examination without abnormal findings: Secondary | ICD-10-CM

## 2018-10-29 DIAGNOSIS — F17200 Nicotine dependence, unspecified, uncomplicated: Secondary | ICD-10-CM

## 2018-10-29 DIAGNOSIS — E782 Mixed hyperlipidemia: Secondary | ICD-10-CM | POA: Diagnosis not present

## 2018-10-29 DIAGNOSIS — I10 Essential (primary) hypertension: Secondary | ICD-10-CM | POA: Diagnosis not present

## 2018-10-29 DIAGNOSIS — E119 Type 2 diabetes mellitus without complications: Secondary | ICD-10-CM

## 2018-10-29 LAB — LIPID PANEL
CHOLESTEROL: 220 mg/dL — AB (ref 0–200)
HDL: 60.1 mg/dL (ref >39.00)
NonHDL: 160.03
Total CHOL/HDL Ratio: 4
Triglycerides: 288 mg/dL — ABNORMAL HIGH (ref 0.0–149.0)
VLDL: 57.6 mg/dL — ABNORMAL HIGH (ref 0.0–40.0)

## 2018-10-29 LAB — CBC WITH DIFFERENTIAL/PLATELET
BASOS PCT: 0.8 % (ref 0.0–3.0)
Basophils Absolute: 0.1 10*3/uL (ref 0.0–0.1)
EOS PCT: 0.7 % (ref 0.0–5.0)
Eosinophils Absolute: 0.1 10*3/uL (ref 0.0–0.7)
HEMATOCRIT: 42.1 % (ref 36.0–46.0)
Hemoglobin: 14.1 g/dL (ref 12.0–15.0)
LYMPHS PCT: 23 % (ref 12.0–46.0)
Lymphs Abs: 2.4 10*3/uL (ref 0.7–4.0)
MCHC: 33.5 g/dL (ref 30.0–36.0)
MCV: 85.9 fl (ref 78.0–100.0)
MONOS PCT: 2.8 % — AB (ref 3.0–12.0)
Monocytes Absolute: 0.3 10*3/uL (ref 0.1–1.0)
NEUTROS ABS: 7.5 10*3/uL (ref 1.4–7.7)
Neutrophils Relative %: 72.7 % (ref 43.0–77.0)
PLATELETS: 336 10*3/uL (ref 150.0–400.0)
RBC: 4.9 Mil/uL (ref 3.87–5.11)
RDW: 13.8 % (ref 11.5–15.5)
WBC: 10.3 10*3/uL (ref 4.0–10.5)

## 2018-10-29 LAB — LDL CHOLESTEROL, DIRECT: Direct LDL: 111 mg/dL

## 2018-10-29 LAB — BASIC METABOLIC PANEL
BUN: 18 mg/dL (ref 6–23)
CO2: 25 mEq/L (ref 19–32)
Calcium: 9.4 mg/dL (ref 8.4–10.5)
Chloride: 102 mEq/L (ref 96–112)
Creatinine, Ser: 0.63 mg/dL (ref 0.40–1.20)
GFR: 99.27 mL/min (ref >60.00)
Glucose, Bld: 218 mg/dL — ABNORMAL HIGH (ref 70–99)
Potassium: 4 mEq/L (ref 3.5–5.1)
Sodium: 136 mEq/L (ref 135–145)

## 2018-10-29 LAB — HEPATIC FUNCTION PANEL
ALT: 19 U/L (ref 0–35)
AST: 19 U/L (ref 0–37)
Albumin: 4 g/dL (ref 3.5–5.2)
Alkaline Phosphatase: 69 U/L (ref 39–117)
BILIRUBIN DIRECT: 0.1 mg/dL (ref 0.0–0.3)
TOTAL PROTEIN: 6.2 g/dL (ref 6.0–8.3)
Total Bilirubin: 0.5 mg/dL (ref 0.2–1.2)

## 2018-10-29 LAB — TSH: TSH: 1.2 u[IU]/mL (ref 0.35–4.50)

## 2018-10-29 LAB — HEMOGLOBIN A1C: Hgb A1c MFr Bld: 6.6 % — ABNORMAL HIGH (ref 4.6–6.5)

## 2018-10-29 NOTE — Progress Notes (Signed)
Subjective:    Patient ID: Shelly Clark, female    DOB: [birth year], 72 y.o.   MRN: [id number]  HPI Patient presents for yearly preventative medicine examination. She is a pleasant 72 year old female who  has a past medical history of Benign hematuria, Diabetes mellitus without complication ([place]), Esophageal dysmotility, Hyperlipidemia, Hypertension, Migraines, Osteoporosis, and Tobacco abuse.   Essential Hypertension - She takes Norvasc 10 mg and lisinopril 40 mg. She has been taking Nyquil/Day Quil/ and over the counter cough medication for the last two weeks for URI.  BP Readings from Last 3 Encounters:  10/29/18 (!) 155/80  10/26/17 (!) 146/70  04/04/17 (!) 150/74   DM - has prescribed Metformin 500 mg BID. She has not been taking this for sometime as it causes GI discomfort. This has not been mentioned in the past. She does not monitor her BS at home   Lab Results  Component Value Date   HGBA1C 6.3 10/26/2017   Anxiety - Takes Klonopin and Prozac - stable   Hyperlipidemia - Takes Zocor and ASA  Lab Results  Component Value Date   CHOL 256 (H) 10/26/2017   HDL 66.70 10/26/2017   LDLCALC 153 (H) 10/26/2017   LDLDIRECT 118.3 03/07/2013   TRIG 180.0 (H) 10/26/2017   CHOLHDL 4 10/26/2017   Tobacco Use - continues to smoke. Refuses to quit.   All immunizations and health maintenance protocols were reviewed with the patient and needed orders were placed.  Appropriate screening laboratory values were ordered for the patient including screening of hyperlipidemia, renal function and hepatic function.   Medication reconciliation,  past medical history, social history, problem list and allergies were reviewed in detail with the patient  Goals were established with regard to weight loss, exercise, and  diet in compliance with medications  End of life planning was discussed.  She has not had her routine eye exam nor mammogram this year. She will make an appointment. She is up to  date on her colonoscopy.   She has no acute complaints   Review of Systems  Constitutional: Negative.   HENT: Negative.   Eyes: Negative.   Respiratory: Negative.   Cardiovascular: Negative.   Gastrointestinal: Negative.   Endocrine: Negative.   Genitourinary: Negative.   Musculoskeletal: Negative.   Skin: Negative.   Allergic/Immunologic: Negative.   Neurological: Negative.   Hematological: Negative.   Psychiatric/Behavioral: Negative.    Past Medical History:  Diagnosis Date  . Benign hematuria    neg urology workup  . Diabetes mellitus without complication ([place])   . Esophageal dysmotility   . Hyperlipidemia   . Hypertension   . Migraines   . Osteoporosis   . Tobacco abuse     Social History   Socioeconomic History  . Marital status: Married    Spouse name: Not on file  . Number of children: Not on file  . Years of education: Not on file  . Highest education level: Not on file  Occupational History  . 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 Financial resource strain: Not on file  . Food insecurity:    Worry: Not on file    Inability: Not on file  . Transportation needs:    Medical: Not on file    Non-medical: Not on file  Tobacco Use  . Smoking status: Current Every Day Smoker    Packs/day: 1.00    Years: 40.00    Pack  years: 40.00    Types: Cigarettes  . Smokeless tobacco: Never Used  Substance and Sexual Activity  . Alcohol use: No  . Drug use: No  . Sexual activity: Not on file  Lifestyle  . Physical activity:    Days per week: Not on file    Minutes per session: Not on file  . Stress: Not on file  Relationships  . Social connections:    Talks on phone: Not on file    Gets together: Not on file    Attends religious service: Not on file    Active member of club or organization: Not on file    Attends meetings of clubs or organizations: Not on file    Relationship status: Not on file  . Intimate  partner violence:    Fear of current or ex partner: Not on file    Emotionally abused: Not on file    Physically abused: Not on file    Forced sexual activity: Not on file  Other Topics Concern  . 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 ABDOMINAL HYSTERECTOMY      Family History  Problem Relation Age of Onset  . Ovarian cancer Mother   . Diabetes Mother   . Hypertension Mother   . Hyperlipidemia Mother   . Diabetes Father   . Hypertension Father   . Hyperlipidemia Father   . Hypertension Brother   . Hypertension Daughter   . Hypertension Daughter     Allergies  Allergen Reactions  . Codeine     Current Outpatient Medications on File Prior to Visit  Medication Sig Dispense Refill  . amLODipine (NORVASC) 10 MG tablet Take 1 tablet (10 mg total) by mouth daily. 90 tablet 3  . aspirin EC 81 MG tablet Take 81 mg by mouth daily.    . B Complex-C (B-COMPLEX WITH VITAMIN C) tablet Take 1 tablet by mouth daily.    . clonazePAM (KLONOPIN) 0.5 MG tablet Take 1 tablet (0.5 mg total) by mouth at bedtime as needed. for sleep 30 tablet 0  . FLUoxetine (PROZAC) 10 MG capsule TAKE 1 CAPSULE(10 MG) BY MOUTH DAILY 90 capsule 1  . lisinopril (PRINIVIL,ZESTRIL) 40 MG tablet Take 1 tablet (40 mg total) by mouth daily. 90 tablet 3  . Multiple Vitamin (MULTIVITAMIN WITH MINERALS) TABS Take 1 tablet by mouth daily.    . simvastatin (ZOCOR) 20 MG tablet Take 1 tablet by mouth once daily 90 tablet 3   No current facility-administered medications on file prior to visit.     BP (!) 155/80   Temp 98.5 F (36.9 C)   Wt 127 lb (57.6 kg)   BMI 25.65 kg/m       Objective:   Physical Exam  Constitutional: She is oriented to person, place, and time. She appears well-developed and well-nourished. No distress.  HENT:  Head: Normocephalic and atraumatic.  Right Ear: External ear  normal.  Left Ear: External ear normal.  Nose: Nose normal.  Mouth/Throat: Oropharynx is clear and moist. No oropharyngeal exudate.  Eyes: Pupils are equal, round, and reactive to light. Conjunctivae and EOM are normal. Right eye exhibits no discharge. Left eye exhibits no discharge. No scleral icterus.  Neck: Normal range of motion. Neck supple. No JVD present. No tracheal deviation present. No thyromegaly present.  Cardiovascular: Normal rate, regular rhythm, normal heart sounds and intact distal pulses. Exam reveals no gallop and no friction rub.  No murmur heard. Pulmonary/Chest: Effort normal and breath sounds normal. No stridor. No respiratory distress. She has no wheezes. She has no rales. She exhibits no tenderness.  Abdominal: Soft. Bowel sounds are normal. She exhibits no distension and no mass. There is no tenderness. There is no rebound and no guarding. No hernia.  Musculoskeletal: Normal range of motion. She exhibits no edema, tenderness or deformity.  Lymphadenopathy:    She has no cervical adenopathy.  Neurological: She is alert and oriented to person, place, and time. She displays normal reflexes. No cranial nerve deficit or sensory deficit. She exhibits normal muscle tone. Coordination normal.  Skin: Skin is warm and dry. Capillary refill takes less than 2 seconds. No rash noted. She is not diaphoretic. No erythema. No pallor.  Psychiatric: She has a normal mood and affect. Her behavior is normal. Judgment and thought content normal.  Nursing note and vitals reviewed.     Assessment & Plan:  1. Routine general medical examination at a health care facility - Needs to quit smoking  - Follow up in one year or sooner if needed for CPE or sooner if needed  - Basic metabolic panel - CBC with Differential/Platelet - Hemoglobin A1c - Hepatic function panel - Lipid panel - TSH  2. Essential hypertension - Not at goal today. Likely due to OTC cold medications  - Continue with  current therapy  - Advised to monitor at home and let me know if BP consistently above 140.  - Basic metabolic panel - CBC with Differential/Platelet - Hemoglobin A1c - Hepatic function panel - Lipid panel - TSH  3. Mixed hyperlipidemia - Consider increase in statin  - Work on lifestyle modifications  - Basic metabolic panel - CBC with Differential/Platelet - Hemoglobin A1c - Hepatic function panel - Lipid panel - TSH  4. Controlled type 2 diabetes mellitus without complication, without long-term current use of insulin ([place]) - Consider Glipzide XR - Basic metabolic panel - CBC with Differential/Platelet - Hemoglobin A1c - Hepatic function panel - Lipid panel - TSH  5. TOBACCO ABUSE - Encouraged to quit smoking   6. Need for hepatitis C screening test - Hep C Antibody   [name], NP

## 2018-10-30 LAB — HEPATITIS C ANTIBODY
Hepatitis C Ab: NONREACTIVE
SIGNAL TO CUT-OFF: 0.01 (ref <1.00)

## 2018-11-01 ENCOUNTER — Other Ambulatory Visit: Payer: Self-pay | Admitting: Adult Health

## 2018-11-01 DIAGNOSIS — E119 Type 2 diabetes mellitus without complications: Secondary | ICD-10-CM

## 2018-11-01 DIAGNOSIS — Z760 Encounter for issue of repeat prescription: Secondary | ICD-10-CM

## 2018-11-01 DIAGNOSIS — E785 Hyperlipidemia, unspecified: Secondary | ICD-10-CM

## 2018-11-01 MED ORDER — GLIPIZIDE ER 5 MG PO TB24: 5.0000 mg | ORAL_TABLET | Freq: Every day | ORAL | 1 refills | Status: DC

## 2018-11-01 MED ORDER — SIMVASTATIN 40 MG PO TABS: ORAL_TABLET | ORAL | 3 refills | Status: DC

## 2018-12-08 ENCOUNTER — Encounter: Payer: Self-pay | Admitting: Adult Health

## 2018-12-11 ENCOUNTER — Other Ambulatory Visit: Payer: Self-pay | Admitting: Adult Health

## 2018-12-13 ENCOUNTER — Encounter: Payer: Self-pay | Admitting: Adult Health

## 2018-12-13 ENCOUNTER — Ambulatory Visit (INDEPENDENT_AMBULATORY_CARE_PROVIDER_SITE_OTHER): Payer: PPO | Admitting: Adult Health

## 2018-12-13 DIAGNOSIS — Z760 Encounter for issue of repeat prescription: Secondary | ICD-10-CM | POA: Diagnosis not present

## 2018-12-13 DIAGNOSIS — I10 Essential (primary) hypertension: Secondary | ICD-10-CM

## 2018-12-13 LAB — BASIC METABOLIC PANEL
BUN: 22 mg/dL (ref 6–23)
CO2: 27 mEq/L (ref 19–32)
Calcium: 10 mg/dL (ref 8.4–10.5)
Chloride: 105 mEq/L (ref 96–112)
Creatinine, Ser: 0.66 mg/dL (ref 0.40–1.20)
GFR: 88.49 mL/min (ref >60.00)
Glucose, Bld: 91 mg/dL (ref 70–99)
POTASSIUM: 4.8 meq/L (ref 3.5–5.1)
Sodium: 139 mEq/L (ref 135–145)

## 2018-12-13 MED ORDER — LISINOPRIL 40 MG PO TABS: 40.0000 mg | ORAL_TABLET | Freq: Every day | ORAL | 0 refills | Status: DC

## 2018-12-13 MED ORDER — AMLODIPINE BESYLATE 10 MG PO TABS: 10.0000 mg | ORAL_TABLET | Freq: Every day | ORAL | 0 refills | Status: DC

## 2018-12-13 NOTE — Patient Instructions (Signed)
It was great seeing you today   Please take your blood pressure medication daily.   Follow up in two weeks   Continue to monitor your blood pressure

## 2018-12-13 NOTE — Progress Notes (Signed)
Subjective:    Patient ID: Shelly Clark, female    DOB: [birth year], 73 y.o.   MRN: [id number]  HPI   73 year old female who  has a past medical history of Benign hematuria, Diabetes mellitus without complication ([place]), Esophageal dysmotility, Hyperlipidemia, Hypertension, Migraines, Osteoporosis, and Tobacco abuse.  She presents to the office today for follow up regarding hypertension. She is currently prescribed Norvasc 10 mg and Lisinopril 40 mg. In the past she has been inconsistent with taking her medications. Today she reports that she has been taking both medications twice a day instead of how it is prescribed. She is now out of her blood pressure medication   She has been monitoring her blood pressure at home and has been having readings between 114-150's/80-90's   She denies shortness of breath, dizziness or lightheadedness  Review of Systems See HPI   Past Medical History:  Diagnosis Date  . Benign hematuria    neg urology workup  . Diabetes mellitus without complication ([place])   . Esophageal dysmotility   . Hyperlipidemia   . Hypertension   . Migraines   . Osteoporosis   . Tobacco abuse     Social History   Socioeconomic History  . Marital status: Married    Spouse name: Not on file  . Number of children: Not on file  . Years of education: Not on file  . Highest education level: Not on file  Occupational History  . 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 Financial resource strain: Not on file  . Food insecurity:    Worry: Not on file    Inability: Not on file  . Transportation needs:    Medical: Not on file    Non-medical: Not on file  Tobacco Use  . Smoking status: Current Every Day Smoker    Packs/day: 1.00    Years: 40.00    Pack years: 40.00    Types: Cigarettes  . Smokeless tobacco: Never Used  Substance and Sexual Activity  . Alcohol use: No  . Drug use: No  . Sexual activity: Not on file    Lifestyle  . Physical activity:    Days per week: Not on file    Minutes per session: Not on file  . Stress: Not on file  Relationships  . Social connections:    Talks on phone: Not on file    Gets together: Not on file    Attends religious service: Not on file    Active member of club or organization: Not on file    Attends meetings of clubs or organizations: Not on file    Relationship status: Not on file  . Intimate partner violence:    Fear of current or ex partner: Not on file    Emotionally abused: Not on file    Physically abused: Not on file    Forced sexual activity: Not on file  Other Topics Concern  . 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 ABDOMINAL HYSTERECTOMY      Family History  Problem Relation Age of Onset  . Ovarian cancer Mother   . Diabetes Mother   . Hypertension Mother   . Hyperlipidemia Mother   . Diabetes Father   . Hypertension Father   .  Hyperlipidemia Father   . Hypertension Brother   . Hypertension Daughter   . Hypertension Daughter     Allergies  Allergen Reactions  . Codeine     Current Outpatient Medications on File Prior to Visit  Medication Sig Dispense Refill  . aspirin EC 81 MG tablet Take 81 mg by mouth daily.    . B Complex-C (B-COMPLEX WITH VITAMIN C) tablet Take 1 tablet by mouth daily.    . clonazePAM (KLONOPIN) 0.5 MG tablet Take 1 tablet (0.5 mg total) by mouth at bedtime as needed. for sleep 30 tablet 0  . FLUoxetine (PROZAC) 10 MG capsule TAKE 1 CAPSULE(10 MG) BY MOUTH DAILY 90 capsule 1  . glipiZIDE (GLIPIZIDE XL) 5 MG 24 hr tablet Take 1 tablet (5 mg total) by mouth daily with breakfast. 90 tablet 1  . Multiple Vitamin (MULTIVITAMIN WITH MINERALS) TABS Take 1 tablet by mouth daily.    . simvastatin (ZOCOR) 40 MG tablet Take 1 tablet by mouth once daily 90 tablet 3   No current facility-administered  medications on file prior to visit.     BP (!) 148/70   Temp 98.2 F (36.8 C)   Wt 129 lb (58.5 kg)   BMI 26.05 kg/m       Objective:   Physical Exam Vitals signs and nursing note reviewed.  Constitutional:      Appearance: Normal appearance.  Cardiovascular:     Rate and Rhythm: Normal rate and regular rhythm.     Pulses: Normal pulses.     Heart sounds: Normal heart sounds.  Pulmonary:     Effort: Pulmonary effort is normal.     Breath sounds: Normal breath sounds.  Musculoskeletal:     Right lower leg: No edema.     Left lower leg: No edema.  Skin:    General: Skin is warm and dry.  Neurological:     General: No focal deficit present.     Mental Status: She is alert and oriented to person, place, and time.  Psychiatric:        Mood and Affect: Mood normal.        Behavior: Behavior normal.        Thought Content: Thought content normal.        Judgment: Judgment normal.       Assessment & Plan:  1. Essential hypertension - We reviewed her medications in detail. Advised to take exactly how it says on the bottle. Continue to monitor BP and follow up in 2 weeks with log.  - amLODipine (NORVASC) 10 MG tablet; Take 1 tablet (10 mg total) by mouth daily.  Dispense: 90 tablet; Refill: 0 - lisinopril (PRINIVIL,ZESTRIL) 40 MG tablet; Take 1 tablet (40 mg total) by mouth daily.  Dispense: 90 tablet; Refill: 0 - Basic Metabolic Panel  2. Medication refill - amLODipine (NORVASC) 10 MG tablet; Take 1 tablet (10 mg total) by mouth daily.  Dispense: 90 tablet; Refill: 0 - lisinopril (PRINIVIL,ZESTRIL) 40 MG tablet; Take 1 tablet (40 mg total) by mouth daily.  Dispense: 90 tablet; Refill: 0   [name], NP

## 2018-12-20 ENCOUNTER — Other Ambulatory Visit: Payer: Self-pay | Admitting: Pharmacist

## 2018-12-20 NOTE — Patient Outreach (Addendum)
[facility]) Care Management  12/20/2018  Shelly Clark 1946/07/16 [id number]  Referral Reason: Medication Management  Referral Source: [employer]  Patient failed statin, ACEi/ARB, and diabetes adherence measures in 2019. Contacted patient to determine if any pharmacy intervention would benefit medication adherence for 2020.  Made contact with patient, but she hung up the phone as I was explaining the purpose of my call. Contacted daughter/emergency contact, [name]. Left HIPAA compliant message for her to return my call at her convenience. Plan to discuss methods to improve patient's adherence, including pill boxes, alarms, or packaging pharmacies.   Per chart review, it was realized at 10/2018 office visit that patient wasn't taking metformin d/t stomach upset. Recommend trying metformin XR instead to improve tolerability, if hypoglycemia occurs on glipizide.   Catie Darnelle Maffucci, PharmD PGY2 Ambulatory Care Pharmacy Resident, [facility] Phone: [phone]

## 2018-12-27 ENCOUNTER — Other Ambulatory Visit: Payer: Self-pay | Admitting: Pharmacist

## 2018-12-27 ENCOUNTER — Ambulatory Visit (INDEPENDENT_AMBULATORY_CARE_PROVIDER_SITE_OTHER): Payer: PPO | Admitting: Adult Health

## 2018-12-27 ENCOUNTER — Encounter: Payer: Self-pay | Admitting: Adult Health

## 2018-12-27 VITALS — BP 128/82 | Temp 98.4°F | Wt 131.0 lb

## 2018-12-27 DIAGNOSIS — I10 Essential (primary) hypertension: Secondary | ICD-10-CM

## 2018-12-27 NOTE — Patient Outreach (Signed)
[facility]) Care Management  12/27/2018  MENNA ABELN 1946-01-14 [id number]   Referral Reason: Medication Management  Referral Source: [employer]  Patient failed statin, ACEi/ARB, and diabetes adherence measures in 2019. Contacted patient to determine if any pharmacy intervention would benefit medication adherence for 2020.  Per office visit today with PCP, patient is now taking her medications as directed. She has moved her medications to a different location so that she better remembers. Recent fill history with pharmacy confirms that she has recent fill on her medications. Will close case d/t no intervention being needed at this time.   Catie Darnelle Maffucci, PharmD PGY2 Ambulatory Care Pharmacy Resident, [facility] Phone: [phone]

## 2018-12-27 NOTE — Progress Notes (Signed)
Subjective:    Patient ID: Shelly Clark, female    DOB: 1946-09-03, 73 y.o.   MRN: [id number]  HPI  73 year old female who  has a past medical history of Benign hematuria, Diabetes mellitus without complication ([place]), Esophageal dysmotility, Hyperlipidemia, Hypertension, Migraines, Osteoporosis, and Tobacco abuse.  She presents to the office today for two week follow up regarding hypertension. During the last visit she reported that she was not taking medication as directed. In the past she has been very inconsistent with taking medications. More recently she was taking her BP medication twice a day instead of what it showed on the bottle. She was getting varying BP results from [id number] systolic. We reviewed her medications and she was advised to take medication as directed, monitor her BP and follow up in 2 weeks   Today she reports that she is taking her medication every day, as directed. She has started placing her medication next to her dogs medication so that she remembers to take it. Per her home BP cuff she has had readings between 115/70-145/80's, with most being in the mid 120's. She denies lightheaded, dizziness, or headaches.    Review of Systems See HPI   Past Medical History:  Diagnosis Date  . Benign hematuria    neg urology workup  . Diabetes mellitus without complication ([place])   . Esophageal dysmotility   . Hyperlipidemia   . Hypertension   . Migraines   . Osteoporosis   . Tobacco abuse     Social History   Socioeconomic History  . Marital status: Married    Spouse name: Not on file  . Number of children: Not on file  . Years of education: Not on file  . Highest education level: Not on file  Occupational History  . 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 Financial resource strain: Not on file  . Food insecurity:    Worry: Not on file    Inability: Not on file  . Transportation needs:    Medical: Not on file   Non-medical: Not on file  Tobacco Use  . Smoking status: Current Every Day Smoker    Packs/day: 1.00    Years: 40.00    Pack years: 40.00    Types: Cigarettes  . Smokeless tobacco: Never Used  Substance and Sexual Activity  . Alcohol use: No  . Drug use: No  . Sexual activity: Not on file  Lifestyle  . Physical activity:    Days per week: Not on file    Minutes per session: Not on file  . Stress: Not on file  Relationships  . Social connections:    Talks on phone: Not on file    Gets together: Not on file    Attends religious service: Not on file    Active member of club or organization: Not on file    Attends meetings of clubs or organizations: Not on file    Relationship status: Not on file  . Intimate partner violence:    Fear of current or ex partner: Not on file    Emotionally abused: Not on file    Physically abused: Not on file    Forced sexual activity: Not on file  Other Topics Concern  . Not on file  Social History Narrative   Owns a home    She is married    Two children  Past Surgical History:  Procedure Laterality Date  . ABDOMINAL HYSTERECTOMY      Family History  Problem Relation Age of Onset  . Ovarian cancer Mother   . Diabetes Mother   . Hypertension Mother   . Hyperlipidemia Mother   . Diabetes Father   . Hypertension Father   . Hyperlipidemia Father   . Hypertension Brother   . Hypertension Daughter   . Hypertension Daughter     Allergies  Allergen Reactions  . Codeine     Current Outpatient Medications on File Prior to Visit  Medication Sig Dispense Refill  . amLODipine (NORVASC) 10 MG tablet Take 1 tablet (10 mg total) by mouth daily. 90 tablet 0  . aspirin EC 81 MG tablet Take 81 mg by mouth daily.    . B Complex-C (B-COMPLEX WITH VITAMIN C) tablet Take 1 tablet by mouth daily.    . clonazePAM (KLONOPIN) 0.5 MG tablet Take 1 tablet (0.5 mg total) by mouth at bedtime as needed. for sleep 30 tablet 0  . FLUoxetine  (PROZAC) 10 MG capsule TAKE 1 CAPSULE(10 MG) BY MOUTH DAILY 90 capsule 1  . glipiZIDE (GLIPIZIDE XL) 5 MG 24 hr tablet Take 1 tablet (5 mg total) by mouth daily with breakfast. 90 tablet 1  . lisinopril (PRINIVIL,ZESTRIL) 40 MG tablet Take 1 tablet (40 mg total) by mouth daily. 90 tablet 0  . Multiple Vitamin (MULTIVITAMIN WITH MINERALS) TABS Take 1 tablet by mouth daily.    . simvastatin (ZOCOR) 40 MG tablet Take 1 tablet by mouth once daily 90 tablet 3   No current facility-administered medications on file prior to visit.     BP 128/82   Temp 98.4 F (36.9 C)   Wt 131 lb (59.4 kg)   BMI 26.46 kg/m       Objective:   Physical Exam Vitals signs and nursing note reviewed.  Constitutional:      Appearance: Normal appearance.  Neck:     Musculoskeletal: Normal range of motion and neck supple.  Cardiovascular:     Rate and Rhythm: Normal rate and regular rhythm.     Pulses: Normal pulses.     Heart sounds: Normal heart sounds.  Pulmonary:     Effort: Pulmonary effort is normal.     Breath sounds: Normal breath sounds.  Musculoskeletal: Normal range of motion.  Skin:    General: Skin is warm and dry.  Neurological:     Mental Status: She is alert.  Psychiatric:        Mood and Affect: Mood normal.        Behavior: Behavior normal.        Thought Content: Thought content normal.        Judgment: Judgment normal.       Assessment & Plan:  1. Essential hypertension - Much better controlled now that she is taking her medications - No change in medications  - Follow up PRN   [name], NP

## 2019-01-08 ENCOUNTER — Other Ambulatory Visit: Payer: Self-pay | Admitting: Adult Health

## 2019-01-08 DIAGNOSIS — Z760 Encounter for issue of repeat prescription: Secondary | ICD-10-CM

## 2019-01-08 DIAGNOSIS — E119 Type 2 diabetes mellitus without complications: Secondary | ICD-10-CM

## 2019-01-08 DIAGNOSIS — E785 Hyperlipidemia, unspecified: Secondary | ICD-10-CM

## 2019-01-09 NOTE — Telephone Encounter (Signed)
Sent to the pharmacy by e-scribe. 

## 2019-02-03 ENCOUNTER — Encounter: Payer: Self-pay | Admitting: Adult Health

## 2019-02-03 DIAGNOSIS — F4323 Adjustment disorder with mixed anxiety and depressed mood: Secondary | ICD-10-CM

## 2019-02-03 DIAGNOSIS — Z760 Encounter for issue of repeat prescription: Secondary | ICD-10-CM

## 2019-02-04 MED ORDER — FLUOXETINE HCL 10 MG PO CAPS: ORAL_CAPSULE | ORAL | 1 refills | Status: DC

## 2019-02-04 NOTE — Telephone Encounter (Signed)
Sent to the pharmacy by e-scribe. 

## 2019-03-05 ENCOUNTER — Other Ambulatory Visit: Payer: Self-pay | Admitting: Adult Health

## 2019-03-05 DIAGNOSIS — Z760 Encounter for issue of repeat prescription: Secondary | ICD-10-CM

## 2019-03-05 DIAGNOSIS — I10 Essential (primary) hypertension: Secondary | ICD-10-CM

## 2019-03-06 ENCOUNTER — Other Ambulatory Visit: Payer: Self-pay | Admitting: Adult Health

## 2019-03-06 DIAGNOSIS — Z760 Encounter for issue of repeat prescription: Secondary | ICD-10-CM

## 2019-03-06 DIAGNOSIS — I10 Essential (primary) hypertension: Secondary | ICD-10-CM

## 2019-03-06 NOTE — Telephone Encounter (Signed)
Sent to the pharmacy by e-scribe. 

## 2019-05-13 ENCOUNTER — Ambulatory Visit: Payer: Self-pay

## 2019-05-13 NOTE — Telephone Encounter (Signed)
Pt scheduled with PCP

## 2019-05-13 NOTE — Telephone Encounter (Signed)
Patient's daughter called and says she is the patient's POA. She says the patient was with her visiting her friend and hanging out; the friend notified her that she tested positive for Covid-19. The daughter says they were together about 10-12 hours total. She says her mom doesn't have any symptoms, but is high risk and wants to know what she should do. I advised I would connect her to the office for [facility] to decide. Arbie Cookey, [facility] at the office will call the patient's daughter as she was not on the line for transfer to the office.  Answer Assessment - Initial Assessment Questions 1. CLOSE CONTACT: "Who is the person with the confirmed or suspected COVID-19 infection that you were exposed to?"     Friend 2. PLACE of CONTACT: "Where were you when you were exposed to COVID-19?" (e.g., home, school, medical waiting room; which city?)     [place] 3. TYPE of CONTACT: "How much contact was there?" (e.g., sitting next to, live in same house, work in same office, same building)     Visiting same home, out to eat 4. DURATION of CONTACT: "How long were you in contact with the COVID-19 patient?" (e.g., a few seconds, passed by person, a few minutes, live with the patient)     About 10-12 hours contact 5. DATE of CONTACT: "When did you have contact with a COVID-19 patient?" (e.g., how many days ago)     A week ago 6. TRAVEL: "Have you traveled out of the country recently?" If so, "When and where?"     * Also ask about out-of-state travel, since the CDC has identified some high-risk cities for community spread in the [place].     * Note: Travel becomes less relevant if there is widespread community transmission where the patient lives.     No 7. COMMUNITY SPREAD: "Are there lots of cases of COVID-19 (community spread) where you live?" (See public health department website, if unsure)       No 8. SYMPTOMS: "Do you have any symptoms?" (e.g., fever, cough, breathing difficulty)     No 9. PREGNANCY OR POSTPARTUM: "Is  there any chance you are pregnant?" "When was your last menstrual period?" "Did you deliver in the last 2 weeks?"     No 10. HIGH RISK: "Do you have any heart or lung problems? Do you have a weak immune system?" (e.g., CHF, COPD, asthma, HIV positive, chemotherapy, renal failure, diabetes mellitus, sickle cell anemia)      HTN, diabetes  Protocols used: CORONAVIRUS (COVID-19) EXPOSURE-A-AH

## 2019-05-14 ENCOUNTER — Ambulatory Visit: Payer: PPO | Admitting: Adult Health

## 2019-05-14 DIAGNOSIS — Z1159 Encounter for screening for other viral diseases: Secondary | ICD-10-CM | POA: Diagnosis not present

## 2019-06-07 ENCOUNTER — Other Ambulatory Visit: Payer: Self-pay | Admitting: Adult Health

## 2019-06-07 DIAGNOSIS — E119 Type 2 diabetes mellitus without complications: Secondary | ICD-10-CM

## 2019-06-07 DIAGNOSIS — Z760 Encounter for issue of repeat prescription: Secondary | ICD-10-CM

## 2019-06-09 ENCOUNTER — Other Ambulatory Visit: Payer: Self-pay | Admitting: Adult Health

## 2019-06-09 DIAGNOSIS — E119 Type 2 diabetes mellitus without complications: Secondary | ICD-10-CM

## 2019-06-10 ENCOUNTER — Other Ambulatory Visit: Payer: Self-pay | Admitting: Family Medicine

## 2019-06-10 DIAGNOSIS — E119 Type 2 diabetes mellitus without complications: Secondary | ICD-10-CM

## 2019-06-10 NOTE — Telephone Encounter (Signed)
Sent to the pharmacy by e-scribe for 90 days.  Pt now scheduled for lab and doxy.

## 2019-06-10 NOTE — Progress Notes (Signed)
Order for labs

## 2019-06-11 ENCOUNTER — Other Ambulatory Visit: Payer: Self-pay

## 2019-06-11 ENCOUNTER — Other Ambulatory Visit (INDEPENDENT_AMBULATORY_CARE_PROVIDER_SITE_OTHER): Payer: PPO

## 2019-06-11 DIAGNOSIS — E119 Type 2 diabetes mellitus without complications: Secondary | ICD-10-CM | POA: Diagnosis not present

## 2019-06-11 NOTE — Telephone Encounter (Signed)
Sent to the pharmacy by e-scribe for 90 days. 

## 2019-06-12 ENCOUNTER — Ambulatory Visit (INDEPENDENT_AMBULATORY_CARE_PROVIDER_SITE_OTHER): Payer: PPO | Admitting: Adult Health

## 2019-06-12 ENCOUNTER — Other Ambulatory Visit: Payer: Self-pay

## 2019-06-12 ENCOUNTER — Encounter: Payer: Self-pay | Admitting: Adult Health

## 2019-06-12 DIAGNOSIS — E119 Type 2 diabetes mellitus without complications: Secondary | ICD-10-CM

## 2019-06-12 DIAGNOSIS — G4700 Insomnia, unspecified: Secondary | ICD-10-CM | POA: Diagnosis not present

## 2019-06-12 LAB — HEMOGLOBIN A1C: Hgb A1c MFr Bld: 6.2 % (ref 4.6–6.5)

## 2019-06-12 MED ORDER — CLONAZEPAM 0.5 MG PO TABS: 0.5000 mg | ORAL_TABLET | Freq: Every evening | ORAL | 2 refills | Status: DC | PRN

## 2019-06-12 NOTE — Progress Notes (Signed)
Virtual Visit via Video Note  I connected with Shelly Clark on 06/12/19 at 11:30 AM EDT by a video enabled telemedicine application and verified that I am speaking with the correct person using two identifiers.  Location patient: home Location provider:work or home office Persons participating in the virtual visit: patient, provider  I discussed the limitations of evaluation and management by telemedicine and the availability of in person appointments. The patient expressed understanding and agreed to proceed.   HPI: 73 year old female who is being evaluated today for 40-month follow-up regarding diabetes.  She is currently maintained on glipizide 5 mg extended release and metformin 500 mg twice daily.  She does report that she has been working on cutting back on portion sizes and eating a little bit more healthy.  She is very active but does not exercise on a routine basis.  She denies besides of hypoglycemia.  Today her A1c has improved from 6.6-6.2.  Finally she needs a refill of Klonopin that she takes as needed for sleep.  Her last refill was in June 2019 for 30 tabs  ROS: See pertinent positives and negatives per HPI.  Past Medical History:  Diagnosis Date  . Benign hematuria    neg urology workup  . Diabetes mellitus without complication ([place])   . Esophageal dysmotility   . Hyperlipidemia   . Hypertension   . Migraines   . Osteoporosis   . Tobacco abuse     Past Surgical History:  Procedure Laterality Date  . ABDOMINAL HYSTERECTOMY      Family History  Problem Relation Age of Onset  . Ovarian cancer Mother   . Diabetes Mother   . Hypertension Mother   . Hyperlipidemia Mother   . Diabetes Father   . Hypertension Father   . Hyperlipidemia Father   . Hypertension Brother   . Hypertension Daughter   . Hypertension Daughter     Current Outpatient Medications:  .  amLODipine (NORVASC) 10 MG tablet, TAKE 1 TABLET(10 MG) BY MOUTH DAILY, Disp: 90 tablet, Rfl: 2 .   aspirin EC 81 MG tablet, Take 81 mg by mouth daily., Disp: , Rfl:  .  B Complex-C (B-COMPLEX WITH VITAMIN C) tablet, Take 1 tablet by mouth daily., Disp: , Rfl:  .  clonazePAM (KLONOPIN) 0.5 MG tablet, Take 1 tablet (0.5 mg total) by mouth at bedtime as needed. for sleep, Disp: 30 tablet, Rfl: 0 .  FLUoxetine (PROZAC) 10 MG capsule, TAKE 1 CAPSULE(10 MG) BY MOUTH DAILY, Disp: 90 capsule, Rfl: 1 .  glipiZIDE (GLUCOTROL XL) 5 MG 24 hr tablet, TAKE 1 TABLET(5 MG) BY MOUTH DAILY WITH BREAKFAST, Disp: 90 tablet, Rfl: 0 .  lisinopril (PRINIVIL,ZESTRIL) 40 MG tablet, TAKE 1 TABLET(40 MG) BY MOUTH DAILY, Disp: 90 tablet, Rfl: 2 .  metFORMIN (GLUCOPHAGE) 500 MG tablet, TAKE 1 TABLET BY MOUTH TWICE DAILY WITH A MEAL, Disp: 180 tablet, Rfl: 0 .  Multiple Vitamin (MULTIVITAMIN WITH MINERALS) TABS, Take 1 tablet by mouth daily., Disp: , Rfl:  .  simvastatin (ZOCOR) 20 MG tablet, TAKE 1 TABLET BY MOUTH EVERY DAY, Disp: 90 tablet, Rfl: 3 .  simvastatin (ZOCOR) 40 MG tablet, Take 1 tablet by mouth once daily, Disp: 90 tablet, Rfl: 3  EXAM:  VITALS per patient if applicable:  GENERAL: alert, oriented, appears well and in no acute distress  HEENT: atraumatic, conjunttiva clear, no obvious abnormalities on inspection of external nose and ears  NECK: normal movements of the head and neck  LUNGS: on inspection  no signs of respiratory distress, breathing rate appears normal, no obvious gross SOB, gasping or wheezing  CV: no obvious cyanosis  MS: moves all visible extremities without noticeable abnormality  PSYCH/NEURO: pleasant and cooperative, no obvious depression or anxiety, speech and thought processing grossly intact  ASSESSMENT AND PLAN:  Discussed the following assessment and plan:  1. Controlled type 2 diabetes mellitus without complication, without long-term current use of insulin (HCC) - A1c has improved - Continue with current medication therapy  - Follow up in 3 months  - Continue with  lifestyle modifications   2. Insomnia, unspecified type  - clonazePAM (KLONOPIN) 0.5 MG tablet; Take 1 tablet (0.5 mg total) by mouth at bedtime as needed. for sleep  Dispense: 30 tablet; Refill: 2  [name], NP    I discussed the assessment and treatment plan with the patient. The patient was provided an opportunity to ask questions and all were answered. The patient agreed with the plan and demonstrated an understanding of the instructions.   The patient was advised to call back or seek an in-person evaluation if the symptoms worsen or if the condition fails to improve as anticipated.   [name], NP

## 2019-09-07 ENCOUNTER — Other Ambulatory Visit: Payer: Self-pay | Admitting: Adult Health

## 2019-09-07 DIAGNOSIS — Z760 Encounter for issue of repeat prescription: Secondary | ICD-10-CM

## 2019-09-07 DIAGNOSIS — E119 Type 2 diabetes mellitus without complications: Secondary | ICD-10-CM

## 2019-09-07 DIAGNOSIS — F4323 Adjustment disorder with mixed anxiety and depressed mood: Secondary | ICD-10-CM

## 2019-09-09 ENCOUNTER — Other Ambulatory Visit: Payer: Self-pay | Admitting: Adult Health

## 2019-09-09 DIAGNOSIS — Z760 Encounter for issue of repeat prescription: Secondary | ICD-10-CM

## 2019-09-09 DIAGNOSIS — E119 Type 2 diabetes mellitus without complications: Secondary | ICD-10-CM

## 2019-09-09 NOTE — Telephone Encounter (Signed)
SENT TO THE PHARMACY BY E-SCRIBE. 

## 2019-09-09 NOTE — Telephone Encounter (Signed)
Sent to the pharmacy by e-scribe. 

## 2019-09-10 ENCOUNTER — Encounter: Payer: Self-pay | Admitting: Adult Health

## 2019-11-04 ENCOUNTER — Encounter: Payer: Self-pay | Admitting: Adult Health

## 2019-11-04 ENCOUNTER — Other Ambulatory Visit: Payer: Self-pay

## 2019-11-04 ENCOUNTER — Ambulatory Visit (INDEPENDENT_AMBULATORY_CARE_PROVIDER_SITE_OTHER): Payer: PPO | Admitting: Adult Health

## 2019-11-04 VITALS — BP 164/80 | Temp 97.2°F | Wt 130.0 lb

## 2019-11-04 DIAGNOSIS — G4700 Insomnia, unspecified: Secondary | ICD-10-CM | POA: Diagnosis not present

## 2019-11-04 DIAGNOSIS — S81802A Unspecified open wound, left lower leg, initial encounter: Secondary | ICD-10-CM | POA: Diagnosis not present

## 2019-11-04 DIAGNOSIS — S81801A Unspecified open wound, right lower leg, initial encounter: Secondary | ICD-10-CM | POA: Diagnosis not present

## 2019-11-04 DIAGNOSIS — Z23 Encounter for immunization: Secondary | ICD-10-CM | POA: Diagnosis not present

## 2019-11-04 MED ORDER — DOXYCYCLINE HYCLATE 100 MG PO CAPS: 100.0000 mg | ORAL_CAPSULE | Freq: Two times a day (BID) | ORAL | 0 refills | Status: DC

## 2019-11-04 MED ORDER — CLONAZEPAM 0.5 MG PO TABS: 0.5000 mg | ORAL_TABLET | Freq: Every evening | ORAL | 2 refills | Status: DC | PRN

## 2019-11-04 NOTE — Addendum Note (Signed)
Addended by: Miles Costain T on: 11/04/2019 02:35 PM   Modules accepted: Orders

## 2019-11-04 NOTE — Progress Notes (Signed)
Subjective:    Patient ID: Shelly Clark, female    DOB: [birth year], 73 y.o.   MRN: [id number]  HPI 73 year old female who  has a past medical history of Benign hematuria, Diabetes mellitus without complication ([place]), Esophageal dysmotility, Hyperlipidemia, Hypertension, Migraines, Osteoporosis, and Tobacco abuse.  She presents to the clinic today for an acute issue of small wounds to bilateral ankles.  She reports that about 3 weeks ago she was working out in the yard and believes she became exposed to something that caused allergic outbreak.  Since that time she has had small nonhealing wound on both ankles.  She has been applying Benadryl cream due to itchiness that has been coming from the wound.  She denies drainage from wounds.  She also needs a refill of Klonopin  Review of Systems See HPI   Past Medical History:  Diagnosis Date  . Benign hematuria    neg urology workup  . Diabetes mellitus without complication ([place])   . Esophageal dysmotility   . Hyperlipidemia   . Hypertension   . Migraines   . Osteoporosis   . Tobacco abuse     Social History   Socioeconomic History  . Marital status: Married    Spouse name: Not on file  . Number of children: Not on file  . Years of education: Not on file  . Highest education level: Not on file  Occupational History  . 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 Financial resource strain: Not on file  . Food insecurity    Worry: Not on file    Inability: Not on file  . Transportation needs    Medical: Not on file    Non-medical: Not on file  Tobacco Use  . Smoking status: Current Every Day Smoker    Packs/day: 1.00    Years: 40.00    Pack years: 40.00    Types: Cigarettes  . Smokeless tobacco: Never Used  Substance and Sexual Activity  . Alcohol use: No  . Drug use: No  . Sexual activity: Not on file  Lifestyle  . Physical activity    Days per week: Not on file    Minutes  per session: Not on file  . Stress: Not on file  Relationships  . Social Herbalist on phone: Not on file    Gets together: Not on file    Attends religious service: Not on file    Active member of club or organization: Not on file    Attends meetings of clubs or organizations: Not on file    Relationship status: Not on file  . Intimate partner violence    Fear of current or ex partner: Not on file    Emotionally abused: Not on file    Physically abused: Not on file    Forced sexual activity: Not on file  Other Topics Concern  . 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 ABDOMINAL HYSTERECTOMY      Family History  Problem Relation Age of Onset  . Ovarian cancer Mother   . Diabetes Mother   . Hypertension Mother   . Hyperlipidemia Mother   . Diabetes Father   . Hypertension Father   . Hyperlipidemia Father   .  Hypertension Brother   . Hypertension Daughter   . Hypertension Daughter     Allergies  Allergen Reactions  . Codeine     Current Outpatient Medications on File Prior to Visit  Medication Sig Dispense Refill  . amLODipine (NORVASC) 10 MG tablet TAKE 1 TABLET(10 MG) BY MOUTH DAILY 90 tablet 2  . aspirin EC 81 MG tablet Take 81 mg by mouth daily.    . B Complex-C (B-COMPLEX WITH VITAMIN C) tablet Take 1 tablet by mouth daily.    . clonazePAM (KLONOPIN) 0.5 MG tablet Take 1 tablet (0.5 mg total) by mouth at bedtime as needed. for sleep 30 tablet 2  . FLUoxetine (PROZAC) 10 MG capsule TAKE 1 CAPSULE(10 MG) BY MOUTH DAILY 90 capsule 0  . glipiZIDE (GLUCOTROL XL) 5 MG 24 hr tablet TAKE 1 TABLET(5 MG) BY MOUTH DAILY WITH BREAKFAST 90 tablet 0  . lisinopril (PRINIVIL,ZESTRIL) 40 MG tablet TAKE 1 TABLET(40 MG) BY MOUTH DAILY 90 tablet 2  . metFORMIN (GLUCOPHAGE) 500 MG tablet TAKE 1 TABLET BY MOUTH TWICE DAILY WITH A MEAL 180 tablet 0  . Multiple  Vitamin (MULTIVITAMIN WITH MINERALS) TABS Take 1 tablet by mouth daily.    . simvastatin (ZOCOR) 20 MG tablet TAKE 1 TABLET BY MOUTH EVERY DAY 90 tablet 3  . simvastatin (ZOCOR) 40 MG tablet Take 1 tablet by mouth once daily 90 tablet 3   No current facility-administered medications on file prior to visit.     BP (!) 164/80   Temp (!) 97.2 F (36.2 C)   Wt 130 lb (59 kg)   BMI 26.26 kg/m       Objective:   Physical Exam Vitals signs and nursing note reviewed.  Constitutional:      Appearance: Normal appearance.  Skin:    General: Skin is warm and dry.     Capillary Refill: Capillary refill takes less than 2 seconds.     Findings: Wound present.     Comments: He does have 2 small wounds, one on the medial aspect of both lower extremities.  Wound on right lower extremity appears to be healing well, wound on left lower extremity has localized erythema but no drainage.  No swelling noted  Neurological:     General: No focal deficit present.     Mental Status: She is oriented to person, place, and time.  Psychiatric:        Mood and Affect: Mood normal.        Thought Content: Thought content normal.        Judgment: Judgment normal.       Assessment & Plan:  1. Wound of left lower extremity, initial encounter -Cover for infection.  Unknown cause at this time.  Does not appear as venous stasis ulcers.  Keep wound bandaged, can apply antibacterial ointment.  Follow-up if not resolved by the end of antibiotic therapy - doxycycline (VIBRAMYCIN) 100 MG capsule; Take 1 capsule (100 mg total) by mouth 2 (two) times daily.  Dispense: 14 capsule; Refill: 0  2. Wound of right lower extremity, initial encounter - No signs of infection noted.   3. Insomnia, unspecified type  - clonazePAM (KLONOPIN) 0.5 MG tablet; Take 1 tablet (0.5 mg total) by mouth at bedtime as needed. for sleep  Dispense: 30 tablet; Refill: 2   [name], NP

## 2019-11-26 ENCOUNTER — Telehealth: Payer: Self-pay | Admitting: Adult Health

## 2019-11-26 NOTE — Telephone Encounter (Signed)
Patient's daughter calling to inquire if Shelly Clark has read [employer] regarding patients leg. She states it is still not healing and would like advice.

## 2019-11-26 NOTE — Telephone Encounter (Signed)
I do not have any mychart messages. If not healing, lets have her follow up and take another look

## 2019-11-26 NOTE — Telephone Encounter (Signed)
Do not see a MyChart message

## 2019-11-26 NOTE — Telephone Encounter (Signed)
Pt scheduled for 12/02/2019 for follow up.  Nothing further needed.

## 2019-12-01 ENCOUNTER — Other Ambulatory Visit: Payer: Self-pay

## 2019-12-02 ENCOUNTER — Encounter: Payer: Self-pay | Admitting: Adult Health

## 2019-12-02 ENCOUNTER — Ambulatory Visit (INDEPENDENT_AMBULATORY_CARE_PROVIDER_SITE_OTHER): Payer: PPO | Admitting: Adult Health

## 2019-12-02 VITALS — BP 160/80 | HR 68 | Temp 97.8°F | Ht 59.0 in | Wt 130.0 lb

## 2019-12-02 DIAGNOSIS — L309 Dermatitis, unspecified: Secondary | ICD-10-CM

## 2019-12-02 DIAGNOSIS — R21 Rash and other nonspecific skin eruption: Secondary | ICD-10-CM

## 2019-12-02 NOTE — Progress Notes (Signed)
Subjective:    Patient ID: Shelly Clark, female    DOB: 1946-09-09, 74 y.o.   MRN: [id number]  HPI  74 year old female who  has a past medical history of Benign hematuria, Diabetes mellitus without complication ([place]), Esophageal dysmotility, Hyperlipidemia, Hypertension, Migraines, Osteoporosis, and Tobacco abuse.  She presents to the clinic today for follow-up regarding wounds to bilateral ankles.  She was originally seen on 11/04/2019 at which time she had a nonhealing wound on both ankles.  She had been applying Benadryl cream due to itchiness.  She denied any notes from wound.  It was thought that she was exposed to something in her garden that caused these wounds to appear.  There was no concern for venous stasis ulcers.  She was prescribed doxycycline x7 days.  Today she reports that the wound on the medial aspect of the right ankle is almost completely resolved.  Does not have an open wound on the left medial ankle but continues to have a red flat rash with irregular borders.  Rash continues to itch.  He has been applying Neosporin and keeping the area covered which helps with the itchiness.  Review of Systems See HPI   Past Medical History:  Diagnosis Date  . Benign hematuria    neg urology workup  . Diabetes mellitus without complication ([place])   . Esophageal dysmotility   . Hyperlipidemia   . Hypertension   . Migraines   . Osteoporosis   . Tobacco abuse     Social History   Socioeconomic History  . Marital status: Married    Spouse name: Not on file  . Number of children: Not on file  . Years of education: Not on file  . Highest education level: Not on file  Occupational History  . 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 Smoking status: Current Every Day Smoker    Packs/day: 1.00    Years: 40.00    Pack years: 40.00    Types: Cigarettes  . Smokeless tobacco: Never Used  Substance and Sexual Activity  . Alcohol use:  No  . Drug use: No  . Sexual activity: Not on file  Other Topics Concern  . Not on file  Social History Narrative   Owns a home    She is married    Two children       Social Determinants of Health   Financial Resource Strain:   . Difficulty of Paying Living Expenses: Not on file  Food Insecurity:   . Worried About Charity fundraiser in the Last Year: Not on file  . Ran Out of Food in the Last Year: Not on file  Transportation Needs:   . Lack of Transportation (Medical): Not on file  . Lack of Transportation (Non-Medical): Not on file  Physical Activity:   . Days of Exercise per Week: Not on file  . Minutes of Exercise per Session: Not on file  Stress:   . Feeling of Stress : Not on file  Social Connections:   . Frequency of Communication with Friends and Family: Not on file  . Frequency of Social Gatherings with Friends and Family: Not on file  . Attends Religious Services: Not on file  . Active Member of Clubs or Organizations: Not on file  . Attends Archivist Meetings: Not on file  . Marital Status: Not on file  Intimate Partner Violence:   . Fear  of Current or Ex-Partner: Not on file  . Emotionally Abused: Not on file  . Physically Abused: Not on file  . Sexually Abused: Not on file    Past Surgical History:  Procedure Laterality Date  . ABDOMINAL HYSTERECTOMY      Family History  Problem Relation Age of Onset  . Ovarian cancer Mother   . Diabetes Mother   . Hypertension Mother   . Hyperlipidemia Mother   . Diabetes Father   . Hypertension Father   . Hyperlipidemia Father   . Hypertension Brother   . Hypertension Daughter   . Hypertension Daughter     Allergies  Allergen Reactions  . Codeine     Current Outpatient Medications on File Prior to Visit  Medication Sig Dispense Refill  . amLODipine (NORVASC) 10 MG tablet TAKE 1 TABLET(10 MG) BY MOUTH DAILY 90 tablet 2  . aspirin EC 81 MG tablet Take 81 mg by mouth daily.    . B Complex-C  (B-COMPLEX WITH VITAMIN C) tablet Take 1 tablet by mouth daily.    . clonazePAM (KLONOPIN) 0.5 MG tablet Take 1 tablet (0.5 mg total) by mouth at bedtime as needed. for sleep 30 tablet 2  . doxycycline (VIBRAMYCIN) 100 MG capsule Take 1 capsule (100 mg total) by mouth 2 (two) times daily. 14 capsule 0  . FLUoxetine (PROZAC) 10 MG capsule TAKE 1 CAPSULE(10 MG) BY MOUTH DAILY 90 capsule 0  . glipiZIDE (GLUCOTROL XL) 5 MG 24 hr tablet TAKE 1 TABLET(5 MG) BY MOUTH DAILY WITH BREAKFAST 90 tablet 0  . lisinopril (PRINIVIL,ZESTRIL) 40 MG tablet TAKE 1 TABLET(40 MG) BY MOUTH DAILY 90 tablet 2  . metFORMIN (GLUCOPHAGE) 500 MG tablet TAKE 1 TABLET BY MOUTH TWICE DAILY WITH A MEAL 180 tablet 0  . Multiple Vitamin (MULTIVITAMIN WITH MINERALS) TABS Take 1 tablet by mouth daily.    . simvastatin (ZOCOR) 20 MG tablet TAKE 1 TABLET BY MOUTH EVERY DAY 90 tablet 3  . simvastatin (ZOCOR) 40 MG tablet Take 1 tablet by mouth once daily 90 tablet 3   No current facility-administered medications on file prior to visit.    BP (!) 160/80   Pulse 68   Temp 97.8 F (36.6 C) (Other (Comment))   Ht 4\' 11"  (1.499 m)   Wt 130 lb (59 kg)   SpO2 97%   BMI 26.26 kg/m       Objective:   Physical Exam Vitals and nursing note reviewed.  Constitutional:      Appearance: Normal appearance.  Skin:    Findings: Erythema and rash present.     Comments: 3.5 cm x 3.5 cm irregular shaped red flap rash noted on medial aspect of left ankle.  Neurological:     General: No focal deficit present.     Mental Status: She is alert and oriented to person, place, and time.  Psychiatric:        Mood and Affect: Mood normal.        Behavior: Behavior normal.        Thought Content: Thought content normal.        Judgment: Judgment normal.       Assessment & Plan:  1. Rash and nonspecific skin eruption - Possible Eczema vs psoriasis vs fungal. Will perform shave biopsy  Procedure including risks/benefits explained to patient.   Questions were answered. After informed consent was obtained and a time out completed, site was cleansed with betadine and then alcohol. 1% Lidocaine with epinephrine  was injected under lesion and then shave biopsy was performed. Area was cauterized to obtain hemostasis.  Pt tolerated procedure well.  Specimen sent for pathology review.  Pt instructed to keep the area dry for 24 hours and to contact us if he develops redness, drainage or swelling at the site.  Pt may use tylenol as needed for discomfort today.  - Dermatology pathology

## 2019-12-04 ENCOUNTER — Telehealth: Payer: Self-pay | Admitting: Adult Health

## 2019-12-04 MED ORDER — TRIAMCINOLONE ACETONIDE 0.5 % EX OINT: 1.0000 "application " | TOPICAL_OINTMENT | Freq: Two times a day (BID) | CUTANEOUS | 1 refills | Status: DC

## 2019-12-04 NOTE — Telephone Encounter (Signed)
Updated patient on skin biopsy.  No skin cancer noted.  Biopsy shows eczema.  No fungal infection  We will send in Kenalog cream.  She was advised to also use a good lotion

## 2019-12-31 DIAGNOSIS — Z23 Encounter for immunization: Secondary | ICD-10-CM | POA: Diagnosis not present

## 2020-01-02 ENCOUNTER — Other Ambulatory Visit: Payer: Self-pay | Admitting: Adult Health

## 2020-01-02 DIAGNOSIS — Z760 Encounter for issue of repeat prescription: Secondary | ICD-10-CM

## 2020-01-02 DIAGNOSIS — F4323 Adjustment disorder with mixed anxiety and depressed mood: Secondary | ICD-10-CM

## 2020-01-02 DIAGNOSIS — I10 Essential (primary) hypertension: Secondary | ICD-10-CM

## 2020-01-14 ENCOUNTER — Other Ambulatory Visit: Payer: Self-pay | Admitting: Adult Health

## 2020-01-14 DIAGNOSIS — E785 Hyperlipidemia, unspecified: Secondary | ICD-10-CM

## 2020-01-14 DIAGNOSIS — Z760 Encounter for issue of repeat prescription: Secondary | ICD-10-CM

## 2020-01-19 ENCOUNTER — Other Ambulatory Visit: Payer: Self-pay | Admitting: Adult Health

## 2020-01-19 DIAGNOSIS — Z760 Encounter for issue of repeat prescription: Secondary | ICD-10-CM

## 2020-01-19 DIAGNOSIS — F4323 Adjustment disorder with mixed anxiety and depressed mood: Secondary | ICD-10-CM

## 2020-01-30 ENCOUNTER — Other Ambulatory Visit: Payer: Self-pay | Admitting: Family Medicine

## 2020-01-30 DIAGNOSIS — Z760 Encounter for issue of repeat prescription: Secondary | ICD-10-CM

## 2020-01-30 DIAGNOSIS — I10 Essential (primary) hypertension: Secondary | ICD-10-CM

## 2020-01-30 MED ORDER — AMLODIPINE BESYLATE 10 MG PO TABS: ORAL_TABLET | ORAL | 0 refills | Status: DC

## 2020-01-30 NOTE — Telephone Encounter (Signed)
Sent to the pharmacy by e-scribe. 

## 2020-04-13 ENCOUNTER — Encounter: Payer: Self-pay | Admitting: Adult Health

## 2020-04-15 NOTE — Progress Notes (Signed)
Subjective:    Patient ID: Shelly Clark, female    DOB: [birth year], 74 y.o.   MRN: [id number]  HPI Patient presents for yearly preventative medicine examination. She is a pleasant 74 year old female who  has a past medical history of Benign hematuria, Diabetes mellitus without complication ([place]), Esophageal dysmotility, Hyperlipidemia, Hypertension, Migraines, Osteoporosis, and Tobacco abuse.  Essential Hypertension -she is currently prescribed Norvasc 10 mg and lisinopril 40 mg daily.  She denies dizziness, lightheadedness, chest pain, or shortness of breath.  She has been monitoring blood pressure at home and reports readings of 140s to 150s over 70s to 80s. BP Readings from Last 3 Encounters:  04/16/20 (!) 156/70  12/02/19 (!) 160/80  11/04/19 (!) 164/80    DM -she currently takes Metformin 500 mg twice daily and glipizide 5 mg XR daily. .  She does not monitor her blood sugars at home on a routine basis but denies any signs of hypoglycemia. She does not monitor her blood sugars at home but denies symptoms of hypoglycemia  Lab Results  Component Value Date   HGBA1C 6.2 06/11/2019   Anxiety/Insomnia  - she is well controlled with Prozac 10 mg and klonopin 0.5 mg   Tobacco Use - She continues to smoke and refuses to quit. She understands her risks   Hyperlipidemia - Prescribed Simvastatin 20 mg daily and ASA 81 mg. She denies myalgia or fatigue.  Lab Results  Component Value Date   CHOL 220 (H) 10/29/2018   HDL 60.10 10/29/2018   LDLCALC 153 (H) 10/26/2017   LDLDIRECT 111.0 10/29/2018   TRIG 288.0 (H) 10/29/2018   CHOLHDL 4 10/29/2018   All immunizations and health maintenance protocols were reviewed with the patient and needed orders were placed. She is up to date on vaccinations   Appropriate screening laboratory values were ordered for the patient including screening of hyperlipidemia, renal function and hepatic function.   Medication reconciliation,  past medical  history, social history, problem list and allergies were reviewed in detail with the patient  Goals were established with regard to weight loss, exercise, and  diet in compliance with medications. 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  She is due for mammogram and DEXA scan- refuses   She has no acute complaints   Review of Systems  Constitutional: Negative.   HENT: Negative.   Eyes: Negative.   Respiratory: Negative.   Cardiovascular: Negative.   Gastrointestinal: Negative.   Endocrine: Negative.   Genitourinary: Negative.   Musculoskeletal: Negative.   Skin: Negative.   Allergic/Immunologic: Negative.   Neurological: Negative.   Hematological: Negative.   Psychiatric/Behavioral: Negative.    Past Medical History:  Diagnosis Date  . Benign hematuria    neg urology workup  . Diabetes mellitus without complication ([place])   . Esophageal dysmotility   . Hyperlipidemia   . Hypertension   . Migraines   . Osteoporosis   . Tobacco abuse     Social History   Socioeconomic History  . Marital status: Married    Spouse name: Not on file  . Number of children: Not on file  . Years of education: Not on file  . Highest education level: Not on file  Occupational History  . 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 Smoking  status: Current Every Day Smoker    Packs/day: 1.00    Years: 40.00    Pack years: 40.00    Types: Cigarettes  . Smokeless tobacco: Never Used  Substance and Sexual Activity  . Alcohol use: No  . Drug use: No  . Sexual activity: Not on file  Other Topics Concern  . Not on file  Social History Narrative   Owns a home    She is married    Two children       Social Determinants of Health   Financial Resource Strain:   . Difficulty of Paying Living Expenses:   Food Insecurity:     . Worried About Charity fundraiser in the Last Year:   . Arboriculturist in the Last Year:   Transportation Needs:   . Film/video editor (Medical):   Marland Kitchen Lack of Transportation (Non-Medical):   Physical Activity:   . Days of Exercise per Week:   . Minutes of Exercise per Session:   Stress:   . Feeling of Stress :   Social Connections:   . Frequency of Communication with Friends and Family:   . Frequency of Social Gatherings with Friends and Family:   . Attends Religious Services:   . Active Member of Clubs or Organizations:   . Attends Archivist Meetings:   Marland Kitchen Marital Status:   Intimate Partner Violence:   . Fear of Current or Ex-Partner:   . Emotionally Abused:   Marland Kitchen Physically Abused:   . Sexually Abused:     Past Surgical History:  Procedure Laterality Date  . ABDOMINAL HYSTERECTOMY      Family History  Problem Relation Age of Onset  . Ovarian cancer Mother   . Diabetes Mother   . Hypertension Mother   . Hyperlipidemia Mother   . Diabetes Father   . Hypertension Father   . Hyperlipidemia Father   . Hypertension Brother   . Hypertension Daughter   . Hypertension Daughter     Allergies  Allergen Reactions  . Codeine     Current Outpatient Medications on File Prior to Visit  Medication Sig Dispense Refill  . amLODipine (NORVASC) 10 MG tablet TAKE 1 TABLET(10 MG) BY MOUTH DAILY 90 tablet 0  . aspirin EC 81 MG tablet Take 81 mg by mouth daily.    . B Complex-C (B-COMPLEX WITH VITAMIN C) tablet Take 1 tablet by mouth daily.    . clonazePAM (KLONOPIN) 0.5 MG tablet Take 1 tablet (0.5 mg total) by mouth at bedtime as needed. for sleep 30 tablet 2  . FLUoxetine (PROZAC) 10 MG capsule TAKE 1 CAPSULE(10 MG) BY MOUTH DAILY 90 capsule 0  . glipiZIDE (GLUCOTROL XL) 5 MG 24 hr tablet TAKE 1 TABLET(5 MG) BY MOUTH DAILY WITH BREAKFAST 90 tablet 0  . lisinopril (ZESTRIL) 40 MG tablet TAKE 1 TABLET(40 MG) BY MOUTH DAILY 90 tablet 2  . metFORMIN (GLUCOPHAGE) 500  MG tablet TAKE 1 TABLET BY MOUTH TWICE DAILY WITH A MEAL 180 tablet 0  . Multiple Vitamin (MULTIVITAMIN WITH MINERALS) TABS Take 1 tablet by mouth daily.    . simvastatin (ZOCOR) 20 MG tablet TAKE 1 TABLET BY MOUTH EVERY DAY 90 tablet 3  . simvastatin (ZOCOR) 40 MG tablet Take 1 tablet by mouth once daily 90 tablet 3  . triamcinolone ointment (KENALOG) 0.5 % Apply 1 application topically 2 (two) times daily. 30 g 1   No current facility-administered medications on file prior to visit.  BP (!) 156/70   Temp 97.9 F (36.6 C)   Ht 5' (1.524 m)   Wt 126 lb (57.2 kg)   BMI 24.61 kg/m       Objective:   Physical Exam Vitals and nursing note reviewed.  Constitutional:      General: She is not in acute distress.    Appearance: Normal appearance. She is well-developed. She is not ill-appearing.  HENT:     Head: Normocephalic and atraumatic.     Right Ear: Tympanic membrane, ear canal and external ear normal. There is no impacted cerumen.     Left Ear: Tympanic membrane, ear canal and external ear normal. There is no impacted cerumen.     Nose: Nose normal. No congestion or rhinorrhea.     Mouth/Throat:     Mouth: Mucous membranes are moist.     Pharynx: Oropharynx is clear. No oropharyngeal exudate or posterior oropharyngeal erythema.  Eyes:     General:        Right eye: No discharge.        Left eye: No discharge.     Extraocular Movements: Extraocular movements intact.     Conjunctiva/sclera: Conjunctivae normal.     Pupils: Pupils are equal, round, and reactive to light.  Neck:     Thyroid: No thyromegaly.     Vascular: No carotid bruit.     Trachea: No tracheal deviation.  Cardiovascular:     Rate and Rhythm: Normal rate and regular rhythm.     Pulses: Normal pulses.     Heart sounds: Normal heart sounds. No murmur. No friction rub. No gallop.   Pulmonary:     Effort: Pulmonary effort is normal. No respiratory distress.     Breath sounds: Normal breath sounds. No  stridor. No wheezing, rhonchi or rales.  Chest:     Chest wall: No tenderness.  Abdominal:     General: Abdomen is flat. Bowel sounds are normal. There is no distension.     Palpations: Abdomen is soft. There is no mass.     Tenderness: There is no abdominal tenderness. There is no right CVA tenderness, left CVA tenderness, guarding or rebound.     Hernia: No hernia is present.  Musculoskeletal:        General: No swelling, tenderness, deformity or signs of injury. Normal range of motion.     Cervical back: Normal range of motion and neck supple.     Right lower leg: No edema.     Left lower leg: No edema.  Lymphadenopathy:     Cervical: No cervical adenopathy.  Skin:    General: Skin is warm and dry.     Coloration: Skin is not jaundiced or pale.     Findings: No bruising, erythema, lesion or rash.  Neurological:     General: No focal deficit present.     Mental Status: She is alert and oriented to person, place, and time.     Cranial Nerves: No cranial nerve deficit.     Sensory: No sensory deficit.     Motor: No weakness.     Coordination: Coordination normal.     Gait: Gait normal.     Deep Tendon Reflexes: Reflexes normal.  Psychiatric:        Mood and Affect: Mood normal.        Behavior: Behavior normal.        Thought Content: Thought content normal.        Judgment: Judgment normal.  Assessment & Plan:  1. Routine general medical examination at a health care facility - Needs to quit smoking and exercise more often  - Follow up in one year or sooner if needed - CBC with Differential/Platelet - Comprehensive metabolic panel - Hemoglobin A1c - Lipid panel - TSH  2. Controlled type 2 diabetes mellitus without complication, without long-term current use of insulin ([facility]) - Consider increase in glipizide - Likely follow up in 6 months - CBC with Differential/Platelet - Comprehensive metabolic panel - Hemoglobin A1c - Lipid panel - TSH - glipiZIDE  (GLUCOTROL XL) 5 MG 24 hr tablet; Take 1 tablet (5 mg total) by mouth daily with breakfast.  Dispense: 90 tablet; Refill: 1 - metFORMIN (GLUCOPHAGE) 500 MG tablet; TAKE 1 TABLET BY MOUTH TWICE DAILY WITH A MEAL  Dispense: 180 tablet; Refill: 1  3. Essential hypertension - Will add HCTZ 12.5 mg - Advised to continue to monitor BP at home. Follow up in 2 weeks  - CBC with Differential/Platelet - Comprehensive metabolic panel - Hemoglobin A1c - Lipid panel - TSH - hydrochlorothiazide (MICROZIDE) 12.5 MG capsule; Take 1 capsule (12.5 mg total) by mouth daily.  Dispense: 90 capsule; Refill: 1 - amLODipine (NORVASC) 10 MG tablet; TAKE 1 TABLET(10 MG) BY MOUTH DAILY  Dispense: 90 tablet; Refill: 3  4. Mixed hyperlipidemia - Consider increase in statin  - CBC with Differential/Platelet - Comprehensive metabolic panel - Hemoglobin A1c - Lipid panel - TSH  5. TOBACCO ABUSE - Encouraged to quit smoking - CBC with Differential/Platelet - Comprehensive metabolic panel - Hemoglobin A1c - Lipid panel - TSH  6. Insomnia, unspecified type  - CBC with Differential/Platelet - Comprehensive metabolic panel - Hemoglobin A1c - Lipid panel - TSH - clonazePAM (KLONOPIN) 0.5 MG tablet; Take 1 tablet (0.5 mg total) by mouth at bedtime as needed. for sleep  Dispense: 30 tablet; Refill: 2  7. Adjustment disorder with mixed anxiety and depressed mood - well controlled. No change in medications  - FLUoxetine (PROZAC) 10 MG capsule; TAKE 1 CAPSULE(10 MG) BY MOUTH DAILY  Dispense: 90 capsule; Refill: 1  [name], NP

## 2020-04-16 ENCOUNTER — Other Ambulatory Visit: Payer: Self-pay | Admitting: Adult Health

## 2020-04-16 ENCOUNTER — Other Ambulatory Visit: Payer: Self-pay

## 2020-04-16 ENCOUNTER — Ambulatory Visit (INDEPENDENT_AMBULATORY_CARE_PROVIDER_SITE_OTHER): Payer: PPO | Admitting: Adult Health

## 2020-04-16 ENCOUNTER — Encounter: Payer: Self-pay | Admitting: Adult Health

## 2020-04-16 VITALS — BP 156/70 | Temp 97.9°F | Ht 60.0 in | Wt 126.0 lb

## 2020-04-16 DIAGNOSIS — Z760 Encounter for issue of repeat prescription: Secondary | ICD-10-CM

## 2020-04-16 DIAGNOSIS — F17200 Nicotine dependence, unspecified, uncomplicated: Secondary | ICD-10-CM | POA: Diagnosis not present

## 2020-04-16 DIAGNOSIS — E119 Type 2 diabetes mellitus without complications: Secondary | ICD-10-CM | POA: Diagnosis not present

## 2020-04-16 DIAGNOSIS — E782 Mixed hyperlipidemia: Secondary | ICD-10-CM

## 2020-04-16 DIAGNOSIS — F4323 Adjustment disorder with mixed anxiety and depressed mood: Secondary | ICD-10-CM

## 2020-04-16 DIAGNOSIS — I10 Essential (primary) hypertension: Secondary | ICD-10-CM | POA: Diagnosis not present

## 2020-04-16 DIAGNOSIS — G4700 Insomnia, unspecified: Secondary | ICD-10-CM

## 2020-04-16 DIAGNOSIS — Z0000 Encounter for general adult medical examination without abnormal findings: Secondary | ICD-10-CM

## 2020-04-16 LAB — CBC WITH DIFFERENTIAL/PLATELET
Basophils Absolute: 0.1 10*3/uL (ref 0.0–0.1)
Basophils Relative: 1 % (ref 0.0–3.0)
Eosinophils Absolute: 0.1 10*3/uL (ref 0.0–0.7)
Eosinophils Relative: 1.9 % (ref 0.0–5.0)
HCT: 43.6 % (ref 36.0–46.0)
Hemoglobin: 14.3 g/dL (ref 12.0–15.0)
Lymphocytes Relative: 41.1 % (ref 12.0–46.0)
Lymphs Abs: 2.5 10*3/uL (ref 0.7–4.0)
MCHC: 32.9 g/dL (ref 30.0–36.0)
MCV: 87.7 fl (ref 78.0–100.0)
Monocytes Absolute: 0.3 10*3/uL (ref 0.1–1.0)
Monocytes Relative: 4.6 % (ref 3.0–12.0)
Neutro Abs: 3.1 10*3/uL (ref 1.4–7.7)
Neutrophils Relative %: 51.4 % (ref 43.0–77.0)
Platelets: 263 10*3/uL (ref 150.0–400.0)
RBC: 4.98 Mil/uL (ref 3.87–5.11)
RDW: 14.4 % (ref 11.5–15.5)
WBC: 6.1 10*3/uL (ref 4.0–10.5)

## 2020-04-16 LAB — LIPID PANEL
Cholesterol: 203 mg/dL — ABNORMAL HIGH (ref 0–200)
HDL: 57.3 mg/dL (ref >39.00)
NonHDL: 146.05
Total CHOL/HDL Ratio: 4
Triglycerides: 206 mg/dL — ABNORMAL HIGH (ref 0.0–149.0)
VLDL: 41.2 mg/dL — ABNORMAL HIGH (ref 0.0–40.0)

## 2020-04-16 LAB — COMPREHENSIVE METABOLIC PANEL
ALT: 25 U/L (ref 0–35)
AST: 26 U/L (ref 0–37)
Albumin: 4.6 g/dL (ref 3.5–5.2)
Alkaline Phosphatase: 85 U/L (ref 39–117)
BUN: 16 mg/dL (ref 6–23)
CO2: 30 mEq/L (ref 19–32)
Calcium: 10 mg/dL (ref 8.4–10.5)
Chloride: 102 mEq/L (ref 96–112)
Creatinine, Ser: 0.63 mg/dL (ref 0.40–1.20)
GFR: 93.01 mL/min (ref >60.00)
Glucose, Bld: 111 mg/dL — ABNORMAL HIGH (ref 70–99)
Potassium: 4.8 mEq/L (ref 3.5–5.1)
Sodium: 137 mEq/L (ref 135–145)
Total Bilirubin: 0.5 mg/dL (ref 0.2–1.2)
Total Protein: 7 g/dL (ref 6.0–8.3)

## 2020-04-16 LAB — TSH: TSH: 1.32 u[IU]/mL (ref 0.35–4.50)

## 2020-04-16 LAB — LDL CHOLESTEROL, DIRECT: Direct LDL: 91 mg/dL

## 2020-04-16 LAB — HEMOGLOBIN A1C: Hgb A1c MFr Bld: 6.3 % (ref 4.6–6.5)

## 2020-04-16 MED ORDER — FLUOXETINE HCL 10 MG PO CAPS: ORAL_CAPSULE | ORAL | 1 refills | Status: DC

## 2020-04-16 MED ORDER — AMLODIPINE BESYLATE 10 MG PO TABS: ORAL_TABLET | ORAL | 3 refills | Status: DC

## 2020-04-16 MED ORDER — METFORMIN HCL 500 MG PO TABS: ORAL_TABLET | ORAL | 1 refills | Status: DC

## 2020-04-16 MED ORDER — CLONAZEPAM 0.5 MG PO TABS: 0.5000 mg | ORAL_TABLET | Freq: Every evening | ORAL | 2 refills | Status: DC | PRN

## 2020-04-16 MED ORDER — HYDROCHLOROTHIAZIDE 12.5 MG PO CAPS: 12.5000 mg | ORAL_CAPSULE | Freq: Every day | ORAL | 1 refills | Status: DC

## 2020-04-16 MED ORDER — GLIPIZIDE ER 5 MG PO TB24: 5.0000 mg | ORAL_TABLET | Freq: Every day | ORAL | 1 refills | Status: DC

## 2020-04-21 NOTE — Telephone Encounter (Signed)
Updated patient on her labs  

## 2020-05-06 ENCOUNTER — Encounter: Payer: Self-pay | Admitting: Adult Health

## 2020-05-07 ENCOUNTER — Telehealth (INDEPENDENT_AMBULATORY_CARE_PROVIDER_SITE_OTHER): Payer: PPO | Admitting: Adult Health

## 2020-05-07 ENCOUNTER — Other Ambulatory Visit (INDEPENDENT_AMBULATORY_CARE_PROVIDER_SITE_OTHER): Payer: PPO

## 2020-05-07 ENCOUNTER — Other Ambulatory Visit: Payer: Self-pay | Admitting: Family Medicine

## 2020-05-07 ENCOUNTER — Encounter: Payer: Self-pay | Admitting: Adult Health

## 2020-05-07 ENCOUNTER — Other Ambulatory Visit: Payer: PPO

## 2020-05-07 DIAGNOSIS — R300 Dysuria: Secondary | ICD-10-CM

## 2020-05-07 DIAGNOSIS — N390 Urinary tract infection, site not specified: Secondary | ICD-10-CM

## 2020-05-07 DIAGNOSIS — R319 Hematuria, unspecified: Secondary | ICD-10-CM

## 2020-05-07 DIAGNOSIS — N3001 Acute cystitis with hematuria: Secondary | ICD-10-CM | POA: Diagnosis not present

## 2020-05-07 LAB — URINALYSIS, ROUTINE W REFLEX MICROSCOPIC
Bilirubin Urine: NEGATIVE
Ketones, ur: NEGATIVE
Nitrite: POSITIVE — AB
Specific Gravity, Urine: 1.02 (ref 1.000–1.030)
Total Protein, Urine: 100 — AB
Urine Glucose: NEGATIVE
Urobilinogen, UA: 0.2 (ref 0.0–1.0)
pH: 6.5 (ref 5.0–8.0)

## 2020-05-07 MED ORDER — CIPROFLOXACIN HCL 500 MG PO TABS: 500.0000 mg | ORAL_TABLET | Freq: Two times a day (BID) | ORAL | 0 refills | Status: DC

## 2020-05-07 NOTE — Progress Notes (Signed)
Virtual Visit via Telephone Note  I connected with Shelly Clark on 05/07/20 at  4:00 PM EDT by telephone and verified that I am speaking with the correct person using two identifiers.   I discussed the limitations, risks, security and privacy concerns of performing an evaluation and management service by telephone and the availability of in person appointments. I also discussed with the patient that there may be a patient responsible charge related to this service. The patient expressed understanding and agreed to proceed.  Location patient: home Location provider: work or home office Participants present for the call: patient, provider Patient did not have a visit in the prior 7 days to address this/these issue(s).   History of Present Illness: 74 year old female who  has a past medical history of Benign hematuria, Diabetes mellitus without complication ([place]), Esophageal dysmotility, Hyperlipidemia, Hypertension, Migraines, Osteoporosis, and Tobacco abuse.  She is being evaluated today for concern of a UTI.  Her symptoms started 2 to 3 days ago.  Symptoms include dysuria, hematuria, decreased urine, urgency, frequency, and low back pain.   Observations/Objective: Patient sounds cheerful and well on the phone. I do not appreciate any SOB. Speech and thought processing are grossly intact. Patient reported vitals:  Assessment and Plan: 1. Acute cystitis with hematuria Urinalysis positive for leuks, nitrates, and blood.  Will send urine culture and treat with Cipro twice daily x3 days - Urinalysis; Future - Culture, Urine - ciprofloxacin (CIPRO) 500 MG tablet; Take 1 tablet (500 mg total) by mouth 2 (two) times daily.  Dispense: 6 tablet; Refill: 0   Follow Up Instructions:  I did not refer this patient for an OV in the next 24 hours for this/these issue(s).  I discussed the assessment and treatment plan with the patient. The patient was provided an opportunity to ask questions and  all were answered. The patient agreed with the plan and demonstrated an understanding of the instructions.   The patient was advised to call back or seek an in-person evaluation if the symptoms worsen or if the condition fails to improve as anticipated.  I provided 15 minutes of non-face-to-face time during this encounter.   Shelly Peng, NP

## 2020-05-09 LAB — URINE CULTURE
MICRO NUMBER:: 10581459
SPECIMEN QUALITY:: ADEQUATE

## 2020-07-28 ENCOUNTER — Other Ambulatory Visit: Payer: Self-pay

## 2020-07-29 ENCOUNTER — Encounter: Payer: Self-pay | Admitting: Adult Health

## 2020-07-29 ENCOUNTER — Ambulatory Visit (INDEPENDENT_AMBULATORY_CARE_PROVIDER_SITE_OTHER): Payer: PPO | Admitting: Adult Health

## 2020-07-29 VITALS — BP 160/80 | HR 77 | Temp 98.6°F | Ht 60.0 in | Wt 125.0 lb

## 2020-07-29 DIAGNOSIS — N390 Urinary tract infection, site not specified: Secondary | ICD-10-CM

## 2020-07-29 LAB — POCT URINALYSIS DIPSTICK
Bilirubin, UA: NEGATIVE
Blood, UA: NEGATIVE
Glucose, UA: NEGATIVE
Ketones, UA: NEGATIVE
Nitrite, UA: POSITIVE
Protein, UA: POSITIVE — AB
Spec Grav, UA: 1.015 (ref 1.010–1.025)
Urobilinogen, UA: 0.2 E.U./dL
pH, UA: 6.5 (ref 5.0–8.0)

## 2020-07-29 MED ORDER — CIPROFLOXACIN HCL 500 MG PO TABS: 500.0000 mg | ORAL_TABLET | Freq: Two times a day (BID) | ORAL | 0 refills | Status: DC

## 2020-07-29 NOTE — Progress Notes (Signed)
Subjective:    Patient ID: Shelly Clark, female    DOB: 03-Jul-1946, 74 y.o.   MRN: [id number]  HPI 74 year old female who  has a past medical history of Benign hematuria, Diabetes mellitus without complication ([place]), Esophageal dysmotility, Hyperlipidemia, Hypertension, Migraines, Osteoporosis, and Tobacco abuse.  She presents to the office for concern for UTI. Her symptoms have been present for one week. She reports dysuria as her only complaint. She denies hematuria, low back pain, or pelvic pain. Has not experienced fevers or chills.    Review of Systems See HPI   Past Medical History:  Diagnosis Date  . Benign hematuria    neg urology workup  . Diabetes mellitus without complication ([place])   . Esophageal dysmotility   . Hyperlipidemia   . Hypertension   . Migraines   . Osteoporosis   . Tobacco abuse     Social History   Socioeconomic History  . Marital status: Married    Spouse name: Not on file  . Number of children: Not on file  . Years of education: Not on file  . Highest education level: Not on file  Occupational History  . 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 Smoking status: Current Every Day Smoker    Packs/day: 1.00    Years: 40.00    Pack years: 40.00    Types: Cigarettes  . Smokeless tobacco: Never Used  Substance and Sexual Activity  . Alcohol use: No  . Drug use: No  . Sexual activity: Not on file  Other Topics Concern  . Not on file  Social History Narrative   Owns a home    She is married    Two children       Social Determinants of Health   Financial Resource Strain:   . Difficulty of Paying Living Expenses: Not on file  Food Insecurity:   . Worried About Charity fundraiser in the Last Year: Not on file  . Ran Out of Food in the Last Year: Not on file  Transportation Needs:   . Lack of Transportation (Medical): Not on file  . Lack of Transportation (Non-Medical): Not on file  Physical  Activity:   . Days of Exercise per Week: Not on file  . Minutes of Exercise per Session: Not on file  Stress:   . Feeling of Stress : Not on file  Social Connections:   . Frequency of Communication with Friends and Family: Not on file  . Frequency of Social Gatherings with Friends and Family: Not on file  . Attends Religious Services: Not on file  . Active Member of Clubs or Organizations: Not on file  . Attends Archivist Meetings: Not on file  . Marital Status: Not on file  Intimate Partner Violence:   . Fear of Current or Ex-Partner: Not on file  . Emotionally Abused: Not on file  . Physically Abused: Not on file  . Sexually Abused: Not on file    Past Surgical History:  Procedure Laterality Date  . ABDOMINAL HYSTERECTOMY      Family History  Problem Relation Age of Onset  . Ovarian cancer Mother   . Diabetes Mother   . Hypertension Mother   . Hyperlipidemia Mother   . Diabetes Father   . Hypertension Father   . Hyperlipidemia Father   . Hypertension Brother   . Hypertension Daughter   . Hypertension Daughter  Allergies  Allergen Reactions  . Codeine     Current Outpatient Medications on File Prior to Visit  Medication Sig Dispense Refill  . amLODipine (NORVASC) 10 MG tablet TAKE 1 TABLET(10 MG) BY MOUTH DAILY 90 tablet 3  . aspirin EC 81 MG tablet Take 81 mg by mouth daily.    . B Complex-C (B-COMPLEX WITH VITAMIN C) tablet Take 1 tablet by mouth daily.    . clonazePAM (KLONOPIN) 0.5 MG tablet Take 1 tablet (0.5 mg total) by mouth at bedtime as needed. for sleep 30 tablet 2  . FLUoxetine (PROZAC) 10 MG capsule TAKE 1 CAPSULE(10 MG) BY MOUTH DAILY 90 capsule 1  . glipiZIDE (GLUCOTROL XL) 5 MG 24 hr tablet Take 1 tablet (5 mg total) by mouth daily with breakfast. 90 tablet 1  . hydrochlorothiazide (MICROZIDE) 12.5 MG capsule Take 1 capsule (12.5 mg total) by mouth daily. 90 capsule 1  . lisinopril (ZESTRIL) 40 MG tablet TAKE 1 TABLET(40 MG) BY MOUTH  DAILY 90 tablet 2  . metFORMIN (GLUCOPHAGE) 500 MG tablet TAKE 1 TABLET BY MOUTH TWICE DAILY WITH A MEAL 180 tablet 1  . Multiple Vitamin (MULTIVITAMIN WITH MINERALS) TABS Take 1 tablet by mouth daily.    . simvastatin (ZOCOR) 20 MG tablet TAKE 1 TABLET BY MOUTH EVERY DAY 90 tablet 3  . triamcinolone ointment (KENALOG) 0.5 % Apply 1 application topically 2 (two) times daily. 30 g 1   No current facility-administered medications on file prior to visit.    BP (!) 160/80   Pulse 77   Temp 98.6 F (37 C) (Oral)   Ht 5' (1.524 m)   Wt 125 lb (56.7 kg)   SpO2 99%   BMI 24.41 kg/m       Objective:   Physical Exam Vitals and nursing note reviewed.  Constitutional:      Appearance: Normal appearance.  Abdominal:     General: Abdomen is flat. Bowel sounds are normal.     Palpations: Abdomen is soft.     Tenderness: There is no right CVA tenderness or left CVA tenderness.  Skin:    General: Skin is warm and dry.     Capillary Refill: Capillary refill takes less than 2 seconds.  Neurological:     General: No focal deficit present.     Mental Status: She is alert.  Psychiatric:        Mood and Affect: Mood normal.        Behavior: Behavior normal.        Thought Content: Thought content normal.        Judgment: Judgment normal.           Assessment & Plan:  1. Urinary tract infection without hematuria, site unspecified  - POC Urinalysis Dipstick + leuks  - Culture, Urine - ciprofloxacin (CIPRO) 500 MG tablet; Take 1 tablet (500 mg total) by mouth 2 (two) times daily.  Dispense: 6 tablet; Refill: 0  [name], NP

## 2020-07-29 NOTE — Addendum Note (Signed)
Addended by: Army Fossa on: 07/29/2020 03:08 PM   Modules accepted: Orders

## 2020-07-29 NOTE — Addendum Note (Signed)
Addended by: Army Fossa on: 07/29/2020 03:09 PM   Modules accepted: Orders

## 2020-07-31 LAB — URINE CULTURE
MICRO NUMBER:: 10905218
SPECIMEN QUALITY:: ADEQUATE

## 2020-11-04 ENCOUNTER — Other Ambulatory Visit: Payer: Self-pay | Admitting: Adult Health

## 2020-11-04 DIAGNOSIS — E119 Type 2 diabetes mellitus without complications: Secondary | ICD-10-CM

## 2020-11-04 DIAGNOSIS — I10 Essential (primary) hypertension: Secondary | ICD-10-CM

## 2020-11-04 DIAGNOSIS — F4323 Adjustment disorder with mixed anxiety and depressed mood: Secondary | ICD-10-CM

## 2020-11-08 ENCOUNTER — Encounter: Payer: Self-pay | Admitting: Adult Health

## 2020-11-08 ENCOUNTER — Encounter: Payer: Self-pay | Admitting: Family Medicine

## 2020-11-08 ENCOUNTER — Other Ambulatory Visit: Payer: Self-pay

## 2020-11-08 ENCOUNTER — Emergency Department (INDEPENDENT_AMBULATORY_CARE_PROVIDER_SITE_OTHER)
Admission: RE | Admit: 2020-11-08 | Discharge: 2020-11-08 | Disposition: A | Discharge status: Home / Self Care | Payer: PPO | Source: Ambulatory Visit | Attending: Family Medicine | Admitting: Family Medicine

## 2020-11-08 ENCOUNTER — Telehealth (INDEPENDENT_AMBULATORY_CARE_PROVIDER_SITE_OTHER): Payer: PPO | Admitting: Family Medicine

## 2020-11-08 VITALS — BP 195/81 | HR 75 | Temp 98.2°F | Resp 16

## 2020-11-08 VITALS — Ht 60.0 in

## 2020-11-08 DIAGNOSIS — E782 Mixed hyperlipidemia: Secondary | ICD-10-CM

## 2020-11-08 DIAGNOSIS — K529 Noninfective gastroenteritis and colitis, unspecified: Secondary | ICD-10-CM

## 2020-11-08 DIAGNOSIS — K2900 Acute gastritis without bleeding: Secondary | ICD-10-CM

## 2020-11-08 DIAGNOSIS — M7712 Lateral epicondylitis, left elbow: Secondary | ICD-10-CM

## 2020-11-08 DIAGNOSIS — M79602 Pain in left arm: Secondary | ICD-10-CM

## 2020-11-08 DIAGNOSIS — R112 Nausea with vomiting, unspecified: Secondary | ICD-10-CM | POA: Diagnosis not present

## 2020-11-08 DIAGNOSIS — R1310 Dysphagia, unspecified: Secondary | ICD-10-CM | POA: Diagnosis not present

## 2020-11-08 DIAGNOSIS — E119 Type 2 diabetes mellitus without complications: Secondary | ICD-10-CM

## 2020-11-08 MED ORDER — PANTOPRAZOLE SODIUM 20 MG PO TBEC: 20.0000 mg | DELAYED_RELEASE_TABLET | Freq: Every day | ORAL | 0 refills | Status: DC

## 2020-11-08 MED ORDER — ELBOW BRACE MISC: Freq: Once | Status: AC

## 2020-11-08 MED ORDER — ONDANSETRON HCL 4 MG PO TABS: 4.0000 mg | ORAL_TABLET | Freq: Three times a day (TID) | ORAL | 0 refills | Status: DC | PRN

## 2020-11-08 NOTE — ED Triage Notes (Signed)
Patient presents to Urgent Care with complaints of left arm pain and abdominal pain since about 3 weeks ago. 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 Pt has been taking a lot of tylenol and ibuprofen and the pt's daughter is worried she may have upset her stomach w/ too much medication consumption.

## 2020-11-08 NOTE — ED Provider Notes (Addendum)
[name]    CSN: [id number] Arrival date & time: 11/08/20  1427      History   Chief Complaint Chief Complaint  Patient presents with   Appointment   Abdominal Pain    HPI Shelly Clark is a 74 y.o. female.   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  She developed some arm pain over the lateral epicondyle and begin taking ibuprofen and Tylenol for the pain.  She takes up to 10/day per her history.  Now she has abdominal pain with vomiting.  Complains of burning in her chest with vomiting.  HPI  Past Medical History:  Diagnosis Date   Benign hematuria    neg urology workup   Diabetes mellitus without complication ([place])    Esophageal dysmotility    Hyperlipidemia    Hypertension    Migraines    Osteoporosis    Tobacco abuse     Patient Active Problem List   Diagnosis Date Noted   Dysphagia 02/24/2015   Adjustment disorder with mixed anxiety and depressed mood 02/10/2013   Hyperlipidemia 10/30/2007   TOBACCO ABUSE 10/30/2007   Essential hypertension 10/30/2007   UNSPECIFIED OSTEOPOROSIS 10/30/2007   Type 2 diabetes mellitus, controlled ([place]) 10/30/2007    Past Surgical History:  Procedure Laterality Date   ABDOMINAL HYSTERECTOMY      OB History   No obstetric history on file.      Home Medications    Prior to Admission medications   Medication Sig Start Date End Date Taking? Authorizing Provider  amLODipine (NORVASC) 10 MG tablet TAKE 1 TABLET(10 MG) BY MOUTH DAILY 04/16/20   Nafziger, Tommi Rumps, NP  aspirin EC 81 MG tablet Take 81 mg by mouth daily.    [provider]  B Complex-C (B-COMPLEX WITH VITAMIN C) tablet Take 1 tablet by mouth daily.    [provider]  ciprofloxacin (CIPRO) 500 MG tablet Take 1 tablet (500 mg total) by mouth 2 (two) times daily. 07/29/20   Nafziger, Tommi Rumps, NP  clonazePAM (KLONOPIN) 0.5 MG tablet Take 1 tablet (0.5 mg total) by mouth at bedtime as  needed. for sleep 04/16/20   [name], NP  FLUoxetine (PROZAC) 10 MG capsule TAKE 1 CAPSULE(10 MG) BY MOUTH DAILY 11/05/20   Nafziger, Tommi Rumps, NP  glipiZIDE (GLUCOTROL XL) 5 MG 24 hr tablet TAKE 1 TABLET(5 MG) BY MOUTH DAILY WITH BREAKFAST 11/05/20   Nafziger, Tommi Rumps, NP  hydrochlorothiazide (MICROZIDE) 12.5 MG capsule TAKE 1 CAPSULE(12.5 MG) BY MOUTH DAILY 11/05/20   Nafziger, Tommi Rumps, NP  lisinopril (ZESTRIL) 40 MG tablet TAKE 1 TABLET(40 MG) BY MOUTH DAILY 01/02/20   Nafziger, Tommi Rumps, NP  metFORMIN (GLUCOPHAGE) 500 MG tablet TAKE 1 TABLET BY MOUTH TWICE DAILY WITH A MEAL 11/05/20   Nafziger, Tommi Rumps, NP  Multiple Vitamin (MULTIVITAMIN WITH MINERALS) TABS Take 1 tablet by mouth daily.    [provider]  ondansetron (ZOFRAN) 4 MG tablet Take 1 tablet (4 mg total) by mouth every 8 (eight) hours as needed for up to 3 days for nausea or vomiting. 11/08/20 11/11/20  Martinique, Betty G, MD  simvastatin (ZOCOR) 20 MG tablet TAKE 1 TABLET BY MOUTH EVERY DAY 01/14/20   Nafziger, Tommi Rumps, NP  triamcinolone ointment (KENALOG) 0.5 % Apply 1 application topically 2 (two) times daily. 12/04/19   [name], NP    Family History Family History  Problem Relation Age of Onset   Ovarian cancer Mother    Diabetes Mother  Hypertension Mother    Hyperlipidemia Mother    Diabetes Father    Hypertension Father    Hyperlipidemia Father    Hypertension Brother    Hypertension Daughter    Hypertension Daughter     Social History Social History   Tobacco Use   Smoking status: Current Every Day Smoker    Packs/day: 1.00    Years: 40.00    Pack years: 40.00    Types: Cigarettes   Smokeless tobacco: Never Used  Substance Use Topics   Alcohol use: No   Drug use: No     Allergies   Codeine   Review of Systems Review of Systems  Gastrointestinal: Positive for abdominal pain and vomiting.  Musculoskeletal: Positive for arthralgias.  All other systems reviewed and are  negative.    Physical Exam Triage Vital Signs ED Triage Vitals  Enc Vitals Group     BP 11/08/20 1443 (S) (!) 195/81     Pulse Rate 11/08/20 1443 75     Resp 11/08/20 1443 16     Temp 11/08/20 1443 98.2 F (36.8 C)     Temp Source 11/08/20 1443 Oral     SpO2 11/08/20 1443 97 %     Weight --      Height --      Head Circumference --      Peak Flow --      Pain Score 11/08/20 1441 5     Pain Loc --      Pain Edu? --      Excl. in [place]? --    No data found.  Updated Vital Signs BP (S) (!) 195/81 (BP Location: Right Arm) Comment: has not taken bp meds in 3 days   Pulse 75    Temp 98.2 F (36.8 C) (Oral)    Resp 16    SpO2 97%   Visual Acuity Right Eye Distance:   Left Eye Distance:   Bilateral Distance:    Right Eye Near:   Left Eye Near:    Bilateral Near:     Physical Exam Vitals and nursing note reviewed.  Constitutional:      Appearance: She is well-developed and normal weight.  HENT:     Head: Normocephalic.     Mouth/Throat:     Mouth: Mucous membranes are moist.  Cardiovascular:     Rate and Rhythm: Normal rate and regular rhythm.  Pulmonary:     Effort: Pulmonary effort is normal.     Breath sounds: Normal breath sounds.  Abdominal:     General: Abdomen is flat.     Palpations: Abdomen is soft.     Tenderness: There is no abdominal tenderness.     Hernia: No hernia is present.  Neurological:     General: No focal deficit present.     Mental Status: She is alert.      UC Treatments / Results  Labs (all labs ordered are listed, but only abnormal results are displayed) Labs Reviewed - No data to display  EKG   Radiology No results found.  Procedures Procedures (including critical care time)  Medications Ordered in UC Medications - No data to display  Initial Impression / Assessment and Plan / UC Course  I have reviewed the triage vital signs and the nursing notes.  Pertinent labs & imaging results that were available during my care of  the patient were reviewed by me and considered in my medical decision making (see chart for details).  Tendinitis left elbow.  Excessive NSAID and Tylenol use with gastritis Final Clinical Impressions(s) / UC Diagnoses   Final diagnoses:  None   Discharge Instructions   None    ED Prescriptions    None     PDMP not reviewed this encounter.   Wardell Honour, MD 11/08/20 1515    Wardell Honour, MD 11/08/20 [phone]

## 2020-11-08 NOTE — Discharge Instructions (Addendum)
Stop ibuprofen and Tylenol Use diclofenac topically for arm pain

## 2020-11-08 NOTE — Progress Notes (Signed)
nausea/vomiting/weakness/diarrhea/severe pain/?fever/cs Virtual Visit via Telephone Note  I connected with [name] on 12/13/21at  9:30 AM EST by telephone and verified that I am speaking with the correct person using two identifiers.   I discussed the limitations, risks, security and privacy concerns of performing an evaluation and management service by telephone and the availability of in person appointments. I also discussed with the patient that there may be a patient responsible charge related to this service. The patient expressed understanding and agreed to proceed.  Location patient: home Location provider: work office Participants present for the call: patient,daughter, and provider Patient did not have a visit in the prior 7 days to address this/these issue(s).  Chief Complaint  Patient presents with  . Nausea  . Emesis  . Diarrhea  . Pain  . Fever  . Fatigue   History of Present Illness: [name] is a 74 yo female with hx of DM 2, hyperlipidemia, and hypertension with above complaints. Son symptoms started 3 to 4 days ago.  Fatigue, subjective fever, decreased appetite, "little" headache, rhinorrhea, nasal congestion, postnasal drainage. Negative for sore throat, body aches,anosmia, and ageusia.  Rhinorrhea and nasal congestion have resolved, lasted about 2-3 days. She has not noted cough, wheezing, dyspnea, CP, or a skin rash.  She has not checked her temperature. Yesterday she started with abdominal pain, vomiting, and diarrhea.  Abdominal cramps intermittent, mild, and alleviated by defecation. She has not identified exacerbating factors. Yesterday she was vomiting every 2-3 hours, total 7-10 episodes.  None today. + Nausea, exacerbated by oral intake.  Loose stools yesterday "all day", today she has had 2 small bowel movements.  She has not seen blood in stool. She has not tried OTC medications.  Negative for sick contacts or recent travel. She  denies being on antibiotics for the past 3 months.  COVID-19 vaccination completed.  She has not checked BS.  LUE pain for about 3 weeks, she thinks it happens when cutting shrimp. She thinks it might be a "pinch nerve." No hx of trauma. Negative for associated neck pain, weakness, edema, erythema, numbness,tingling,or burning sensation. Exacerbated by movement and alleviated by rest. Pain is dull and now 5/10. Pain is getting better. She has been "eating" Advil and Tylenol "like candy." Right-handed.  Observations/Objective: Patient sounds cheerful and well on the phone. I do not appreciate any SOB, cough, or stridor Speech and thought processing are grossly intact. Patient reported vitals:Ht 5' (1.524 m)   BMI 24.41 kg/m   Assessment and Plan:  1. Left arm pain We discussed possible etiologies. History does not suggest a serious problem and she is reported improvement. She can take Tylenol 500 mg 3-4 times per day if needed. Topical IcyHot or Aspercreme on affected area will also help. Monitor for new symptoms. Follow-up with PCP if problem is persistent.  2. Gastroenteritis, acute Most likely viral etiology, so symptomatic treatment recommended for now.Other possible causes discussed but at this time I do not think further work-up is necessary. Upon reviewing records she was on ciprofloxacin in 07/2020.  So if symptoms are persistent C. difficile needs to be considered.  She is fully vaccinated for COVID-19, so the probability of this being COVID-19 infection is low but is never 0.  Recommend having COVID-19 test if still having fever. Recommend checking temperature.  OTC Imodium could be used but I do not recommend unless 5-6 or more stools daily. Oral hydration, Gatorade or Pedialyte are good options. Bland and light diet if  tolerated.  F/U as needed.  3. Nausea and vomiting in adult Symptomatic treatment with Zofran 4 mg 3 times daily as needed. She was clearly  instructed about warning signs.  Follow Up Instructions:  Return if symptoms worsen or fail to improve.  I did not refer this patient for an OV in the next 24 hours for this/these issue(s).  I discussed the assessment and treatment plan with the patient. The patient was provided an opportunity to ask questions and all were answered. The patient agreed with the plan and demonstrated an understanding of the instructions.   Instructions given to patient and her daughter, they both voiced understanding.  The patient was advised to call back or seek an in-person evaluation if the symptoms worsen or if the condition fails to improve as anticipated.  I provided 22 minutes of non-face-to-face time during this encounter.   Caleb Prigmore Martinique, MD

## 2020-11-09 ENCOUNTER — Encounter: Payer: Self-pay | Admitting: Family Medicine

## 2020-11-10 LAB — COMPLETE METABOLIC PANEL WITH GFR
AG Ratio: 1.9 (calc) (ref 1.0–2.5)
ALT: 22 U/L (ref 6–29)
AST: 25 U/L (ref 10–35)
Albumin: 4.3 g/dL (ref 3.6–5.1)
Alkaline phosphatase (APISO): 83 U/L (ref 37–153)
BUN/Creatinine Ratio: 26 (calc) — ABNORMAL HIGH (ref 6–22)
BUN: 13 mg/dL (ref 7–25)
CO2: 24 mmol/L (ref 20–32)
Calcium: 9.6 mg/dL (ref 8.6–10.4)
Chloride: 94 mmol/L — ABNORMAL LOW (ref 98–110)
Creat: 0.5 mg/dL — ABNORMAL LOW (ref 0.60–0.93)
GFR, Est African American: 112 mL/min/{1.73_m2} (ref >=60)
GFR, Est Non African American: 97 mL/min/{1.73_m2} (ref >=60)
Globulin: 2.3 g/dL (calc) (ref 1.9–3.7)
Glucose, Bld: 134 mg/dL — ABNORMAL HIGH (ref 65–99)
Potassium: 3.9 mmol/L (ref 3.5–5.3)
Sodium: 131 mmol/L — ABNORMAL LOW (ref 135–146)
Total Bilirubin: 0.5 mg/dL (ref 0.2–1.2)
Total Protein: 6.6 g/dL (ref 6.1–8.1)

## 2020-11-10 LAB — SPECIMEN COMPROMISED

## 2020-11-11 ENCOUNTER — Emergency Department (HOSPITAL_COMMUNITY): Payer: PPO

## 2020-11-11 ENCOUNTER — Other Ambulatory Visit: Payer: Self-pay

## 2020-11-11 ENCOUNTER — Inpatient Hospital Stay (HOSPITAL_COMMUNITY)
Admission: EM | Admit: 2020-11-11 | Discharge: 2020-11-14 | DRG: 872 | Disposition: A | Discharge status: Home / Self Care | Payer: PPO | Attending: Internal Medicine | Admitting: Internal Medicine

## 2020-11-11 DIAGNOSIS — Z833 Family history of diabetes mellitus: Secondary | ICD-10-CM

## 2020-11-11 DIAGNOSIS — E860 Dehydration: Secondary | ICD-10-CM | POA: Diagnosis not present

## 2020-11-11 DIAGNOSIS — F17210 Nicotine dependence, cigarettes, uncomplicated: Secondary | ICD-10-CM | POA: Diagnosis present

## 2020-11-11 DIAGNOSIS — Z8249 Family history of ischemic heart disease and other diseases of the circulatory system: Secondary | ICD-10-CM | POA: Diagnosis not present

## 2020-11-11 DIAGNOSIS — N390 Urinary tract infection, site not specified: Secondary | ICD-10-CM | POA: Diagnosis not present

## 2020-11-11 DIAGNOSIS — E782 Mixed hyperlipidemia: Secondary | ICD-10-CM | POA: Diagnosis present

## 2020-11-11 DIAGNOSIS — Z716 Tobacco abuse counseling: Secondary | ICD-10-CM | POA: Diagnosis not present

## 2020-11-11 DIAGNOSIS — Z20822 Contact with and (suspected) exposure to covid-19: Secondary | ICD-10-CM | POA: Diagnosis present

## 2020-11-11 DIAGNOSIS — R1031 Right lower quadrant pain: Secondary | ICD-10-CM | POA: Diagnosis not present

## 2020-11-11 DIAGNOSIS — J90 Pleural effusion, not elsewhere classified: Secondary | ICD-10-CM | POA: Diagnosis not present

## 2020-11-11 DIAGNOSIS — R7881 Bacteremia: Secondary | ICD-10-CM | POA: Diagnosis present

## 2020-11-11 DIAGNOSIS — E119 Type 2 diabetes mellitus without complications: Secondary | ICD-10-CM | POA: Diagnosis not present

## 2020-11-11 DIAGNOSIS — A4151 Sepsis due to Escherichia coli [E. coli]: Secondary | ICD-10-CM | POA: Diagnosis not present

## 2020-11-11 DIAGNOSIS — Z8041 Family history of malignant neoplasm of ovary: Secondary | ICD-10-CM

## 2020-11-11 DIAGNOSIS — R110 Nausea: Secondary | ICD-10-CM | POA: Diagnosis not present

## 2020-11-11 DIAGNOSIS — R509 Fever, unspecified: Secondary | ICD-10-CM | POA: Diagnosis not present

## 2020-11-11 DIAGNOSIS — E876 Hypokalemia: Secondary | ICD-10-CM | POA: Diagnosis not present

## 2020-11-11 DIAGNOSIS — B9620 Unspecified Escherichia coli [E. coli] as the cause of diseases classified elsewhere: Secondary | ICD-10-CM

## 2020-11-11 DIAGNOSIS — Z79899 Other long term (current) drug therapy: Secondary | ICD-10-CM

## 2020-11-11 DIAGNOSIS — E871 Hypo-osmolality and hyponatremia: Secondary | ICD-10-CM | POA: Diagnosis present

## 2020-11-11 DIAGNOSIS — Z83438 Family history of other disorder of lipoprotein metabolism and other lipidemia: Secondary | ICD-10-CM | POA: Diagnosis not present

## 2020-11-11 DIAGNOSIS — R062 Wheezing: Secondary | ICD-10-CM | POA: Diagnosis not present

## 2020-11-11 DIAGNOSIS — R059 Cough, unspecified: Secondary | ICD-10-CM | POA: Diagnosis not present

## 2020-11-11 DIAGNOSIS — K219 Gastro-esophageal reflux disease without esophagitis: Secondary | ICD-10-CM | POA: Diagnosis not present

## 2020-11-11 DIAGNOSIS — Z7982 Long term (current) use of aspirin: Secondary | ICD-10-CM

## 2020-11-11 DIAGNOSIS — M810 Age-related osteoporosis without current pathological fracture: Secondary | ICD-10-CM | POA: Diagnosis present

## 2020-11-11 DIAGNOSIS — E1169 Type 2 diabetes mellitus with other specified complication: Secondary | ICD-10-CM | POA: Diagnosis not present

## 2020-11-11 DIAGNOSIS — I10 Essential (primary) hypertension: Secondary | ICD-10-CM | POA: Diagnosis not present

## 2020-11-11 DIAGNOSIS — Z90710 Acquired absence of both cervix and uterus: Secondary | ICD-10-CM

## 2020-11-11 DIAGNOSIS — A4150 Gram-negative sepsis, unspecified: Secondary | ICD-10-CM | POA: Diagnosis present

## 2020-11-11 DIAGNOSIS — Z7984 Long term (current) use of oral hypoglycemic drugs: Secondary | ICD-10-CM

## 2020-11-11 LAB — COMPREHENSIVE METABOLIC PANEL
ALT: 55 U/L — ABNORMAL HIGH (ref 0–44)
AST: 48 U/L — ABNORMAL HIGH (ref 15–41)
Albumin: 3.7 g/dL (ref 3.5–5.0)
Alkaline Phosphatase: 96 U/L (ref 38–126)
Anion gap: 12 (ref 5–15)
BUN: 19 mg/dL (ref 8–23)
CO2: 26 mmol/L (ref 22–32)
Calcium: 8.6 mg/dL — ABNORMAL LOW (ref 8.9–10.3)
Chloride: 91 mmol/L — ABNORMAL LOW (ref 98–111)
Creatinine, Ser: 0.68 mg/dL (ref 0.44–1.00)
GFR, Estimated: >60 mL/min (ref >60)
Glucose, Bld: 145 mg/dL — ABNORMAL HIGH (ref 70–99)
Potassium: 2.8 mmol/L — ABNORMAL LOW (ref 3.5–5.1)
Sodium: 129 mmol/L — ABNORMAL LOW (ref 135–145)
Total Bilirubin: 1.3 mg/dL — ABNORMAL HIGH (ref 0.3–1.2)
Total Protein: 6.8 g/dL (ref 6.5–8.1)

## 2020-11-11 LAB — CBC WITH DIFFERENTIAL/PLATELET
Abs Immature Granulocytes: 0.23 10*3/uL — ABNORMAL HIGH (ref 0.00–0.07)
Basophils Absolute: 0.1 10*3/uL (ref 0.0–0.1)
Basophils Relative: 0 %
Eosinophils Absolute: 0.1 10*3/uL (ref 0.0–0.5)
Eosinophils Relative: 0 %
HCT: 38.5 % (ref 36.0–46.0)
Hemoglobin: 13.1 g/dL (ref 12.0–15.0)
Immature Granulocytes: 1 %
Lymphocytes Relative: 5 %
Lymphs Abs: 1.1 10*3/uL (ref 0.7–4.0)
MCH: 28.6 pg (ref 26.0–34.0)
MCHC: 34 g/dL (ref 30.0–36.0)
MCV: 84.1 fL (ref 80.0–100.0)
Monocytes Absolute: 1 10*3/uL (ref 0.1–1.0)
Monocytes Relative: 5 %
Neutro Abs: 19 10*3/uL — ABNORMAL HIGH (ref 1.7–7.7)
Neutrophils Relative %: 89 %
Platelets: 251 10*3/uL (ref 150–400)
RBC: 4.58 MIL/uL (ref 3.87–5.11)
RDW: 13.1 % (ref 11.5–15.5)
WBC: 21.4 10*3/uL — ABNORMAL HIGH (ref 4.0–10.5)
nRBC: 0 % (ref 0.0–0.2)

## 2020-11-11 LAB — LACTIC ACID, PLASMA
Lactic Acid, Venous: 1.5 mmol/L (ref 0.5–1.9)
Lactic Acid, Venous: 1.8 mmol/L (ref 0.5–1.9)

## 2020-11-11 LAB — PROTIME-INR
INR: 1 (ref 0.8–1.2)
Prothrombin Time: 12.7 seconds (ref 11.4–15.2)

## 2020-11-11 LAB — URINALYSIS, ROUTINE W REFLEX MICROSCOPIC
Bilirubin Urine: NEGATIVE
Glucose, UA: NEGATIVE mg/dL
Ketones, ur: NEGATIVE mg/dL
Nitrite: POSITIVE — AB
Protein, ur: 100 mg/dL — AB
Specific Gravity, Urine: 1.012 (ref 1.005–1.030)
WBC, UA: >50 WBC/hpf — ABNORMAL HIGH (ref 0–5)
pH: 6 (ref 5.0–8.0)

## 2020-11-11 LAB — RESP PANEL BY RT-PCR (FLU A&B, COVID) ARPGX2
Influenza A by PCR: NEGATIVE
Influenza B by PCR: NEGATIVE
SARS Coronavirus 2 by RT PCR: NEGATIVE

## 2020-11-11 LAB — LIPASE, BLOOD: Lipase: 24 U/L (ref 11–51)

## 2020-11-11 LAB — ACETAMINOPHEN LEVEL: Acetaminophen (Tylenol), Serum: <10 ug/mL — ABNORMAL LOW (ref 10–30)

## 2020-11-11 MED ORDER — SODIUM CHLORIDE 0.9 % IV SOLN
1.0000 g | Freq: Once | INTRAVENOUS | Status: AC
  Administered 2020-11-11: 1 g via INTRAVENOUS
  Filled 2020-11-11: qty 10

## 2020-11-11 MED ORDER — INSULIN ASPART 100 UNIT/ML ~~LOC~~ SOLN
0.0000 [IU] | Freq: Three times a day (TID) | SUBCUTANEOUS | Status: DC
  Administered 2020-11-12 – 2020-11-14 (×3): 2 [IU] via SUBCUTANEOUS
  Filled 2020-11-11: qty 0.15

## 2020-11-11 MED ORDER — IOHEXOL 300 MG/ML  SOLN
100.0000 mL | Freq: Once | INTRAMUSCULAR | Status: AC | PRN
  Administered 2020-11-11: 100 mL via INTRAVENOUS

## 2020-11-11 MED ORDER — SODIUM CHLORIDE (PF) 0.9 % IJ SOLN
INTRAMUSCULAR | Status: AC
  Filled 2020-11-11: qty 50

## 2020-11-11 MED ORDER — SODIUM CHLORIDE 0.9 % IV BOLUS
500.0000 mL | Freq: Once | INTRAVENOUS | Status: AC
  Administered 2020-11-11: 500 mL via INTRAVENOUS

## 2020-11-11 MED ORDER — SODIUM CHLORIDE 0.9 % IV BOLUS
500.0000 mL | Freq: Once | INTRAVENOUS | Status: AC
  Administered 2020-11-12: 500 mL via INTRAVENOUS

## 2020-11-11 MED ORDER — POTASSIUM CHLORIDE 10 MEQ/100ML IV SOLN
10.0000 meq | INTRAVENOUS | Status: AC
  Administered 2020-11-12 (×4): 10 meq via INTRAVENOUS
  Filled 2020-11-11 (×4): qty 100

## 2020-11-11 NOTE — ED Provider Notes (Signed)
[facility] DEPT Provider Note   CSN: [id number] Arrival date & time: 11/11/20  1609     History Chief Complaint  Patient presents with   Fever   Cough    Shelly Clark is a 74 y.o. female.  The history is provided by the patient and medical records.  Fever Associated symptoms: cough   Cough Associated symptoms: fever    Shelly Clark is a 74 y.o. female who presents to the Emergency Department complaining of fever. She presents the emergency department complaining of four days of fever, nausea and right lower quadrant abdominal pain. She reports fever to 104.5 at home. She has associated chills. She had one episode of emesis several days ago. She has mild associated dysuria. She had transient diarrhea, now resolved. She denies any cough. She also reports that incidentally about three weeks ago she had pain in her left elbow that she describes as tendinitis. The pain was severe and she was taking 10 ibuprofen and 10 Tylenol daily for about two weeks. She has since stopped taking the medication so much. She has been fully vaccinated for COVID-19. No known sick contacts. Symptoms are severe and constant in nature.    Past Medical History:  Diagnosis Date   Benign hematuria    neg urology workup   Diabetes mellitus without complication ([place])    Esophageal dysmotility    Hyperlipidemia    Hypertension    Migraines    Osteoporosis    Tobacco abuse     Patient Active Problem List   Diagnosis Date Noted   Sepsis due to gram-negative UTI ([place]) 11/11/2020   Dysphagia 02/24/2015   Adjustment disorder with mixed anxiety and depressed mood 02/10/2013   Hyperlipidemia 10/30/2007   TOBACCO ABUSE 10/30/2007   Essential hypertension 10/30/2007   UNSPECIFIED OSTEOPOROSIS 10/30/2007   Type 2 diabetes mellitus, controlled ([place]) 10/30/2007    Past Surgical History:  Procedure Laterality Date   ABDOMINAL HYSTERECTOMY       OB  History   No obstetric history on file.     Family History  Problem Relation Age of Onset   Ovarian cancer Mother    Diabetes Mother    Hypertension Mother    Hyperlipidemia Mother    Diabetes Father    Hypertension Father    Hyperlipidemia Father    Hypertension Brother    Hypertension Daughter    Hypertension Daughter     Social History   Tobacco Use   Smoking status: Current Every Day Smoker    Packs/day: 1.00    Years: 40.00    Pack years: 40.00    Types: Cigarettes   Smokeless tobacco: Never Used  Substance Use Topics   Alcohol use: No   Drug use: No    Home Medications Prior to Admission medications   Medication Sig Start Date End Date Taking? Authorizing Provider  amLODipine (NORVASC) 10 MG tablet TAKE 1 TABLET(10 MG) BY MOUTH DAILY 04/16/20  Yes Nafziger, Tommi Rumps, NP  aspirin EC 81 MG tablet Take 81 mg by mouth daily.   Yes [provider]  B Complex-C (B-COMPLEX WITH VITAMIN C) tablet Take 1 tablet by mouth daily.   Yes [provider]  clonazePAM (KLONOPIN) 0.5 MG tablet Take 1 tablet (0.5 mg total) by mouth at bedtime as needed. for sleep 04/16/20  Yes Nafziger, Tommi Rumps, NP  FLUoxetine (PROZAC) 10 MG capsule TAKE 1 CAPSULE(10 MG) BY MOUTH DAILY Patient taking differently: Take 10 mg by mouth daily.  11/05/20  Yes Nafziger, Tommi Rumps, NP  glipiZIDE (GLUCOTROL XL) 5 MG 24 hr tablet TAKE 1 TABLET(5 MG) BY MOUTH DAILY WITH BREAKFAST Patient taking differently: Take 5 mg by mouth daily with breakfast. 11/05/20  Yes Nafziger, Tommi Rumps, NP  hydrochlorothiazide (MICROZIDE) 12.5 MG capsule TAKE 1 CAPSULE(12.5 MG) BY MOUTH DAILY Patient taking differently: Take 12.5 mg by mouth daily. 11/05/20  Yes Nafziger, Tommi Rumps, NP  lisinopril (ZESTRIL) 40 MG tablet TAKE 1 TABLET(40 MG) BY MOUTH DAILY Patient taking differently: Take 40 mg by mouth daily. 01/02/20  Yes Nafziger, Tommi Rumps, NP  metFORMIN (GLUCOPHAGE) 500 MG tablet TAKE 1 TABLET BY MOUTH TWICE DAILY WITH A  MEAL Patient taking differently: Take 500 mg by mouth 2 (two) times daily with a meal. 11/05/20  Yes Nafziger, Tommi Rumps, NP  Multiple Vitamin (MULTIVITAMIN WITH MINERALS) TABS Take 1 tablet by mouth daily.   Yes [provider]  pantoprazole (PROTONIX) 20 MG tablet Take 1 tablet (20 mg total) by mouth daily. 11/08/20  Yes Wardell Honour, MD  simvastatin (ZOCOR) 20 MG tablet TAKE 1 TABLET BY MOUTH EVERY DAY Patient taking differently: Take 20 mg by mouth daily at 6 PM. 01/14/20  Yes Nafziger, Tommi Rumps, NP  ciprofloxacin (CIPRO) 500 MG tablet Take 1 tablet (500 mg total) by mouth 2 (two) times daily. Patient not taking: No sig reported 07/29/20   [name], NP  triamcinolone ointment (KENALOG) 0.5 % Apply 1 application topically 2 (two) times daily. Patient not taking: Reported on 11/12/2020 12/04/19   [name], NP    Allergies    Codeine  Review of Systems   Review of Systems  Constitutional: Positive for fever.  Respiratory: Positive for cough.   All other systems reviewed and are negative.   Physical Exam Updated Vital Signs BP (!) 159/75 (BP Location: Right Arm)    Pulse 99    Temp 98.5 F (36.9 C) (Oral)    Resp 20    Ht 5' (1.524 m)    Wt 56.2 kg    SpO2 96%    BMI 24.20 kg/m   Physical Exam Vitals and nursing note reviewed.  Constitutional:      Appearance: She is well-developed and well-nourished.  HENT:     Head: Normocephalic and atraumatic.  Cardiovascular:     Rate and Rhythm: Normal rate and regular rhythm.     Heart sounds: No murmur heard.   Pulmonary:     Effort: Pulmonary effort is normal. No respiratory distress.     Breath sounds: Normal breath sounds.  Abdominal:     Palpations: Abdomen is soft.     Tenderness: There is no abdominal tenderness. There is no guarding or rebound.     Comments: No significant abdominal tenderness  Musculoskeletal:        General: No swelling, tenderness or edema.  Skin:    General: Skin is warm and dry.   Neurological:     Mental Status: She is alert and oriented to person, place, and time.  Psychiatric:        Mood and Affect: Mood and affect normal.        Behavior: Behavior normal.     ED Results / Procedures / Treatments   Labs (all labs ordered are listed, but only abnormal results are displayed) Labs Reviewed  COMPREHENSIVE METABOLIC PANEL - Abnormal; Notable for the following components:      Result Value   Sodium 129 (*)    Potassium 2.8 (*)    Chloride  91 (*)    Glucose, Bld 145 (*)    Calcium 8.6 (*)    AST 48 (*)    ALT 55 (*)    Total Bilirubin 1.3 (*)    All other components within normal limits  ACETAMINOPHEN LEVEL - Abnormal; Notable for the following components:   Acetaminophen (Tylenol), Serum <10 (*)    All other components within normal limits  CBC WITH DIFFERENTIAL/PLATELET - Abnormal; Notable for the following components:   WBC 21.4 (*)    Neutro Abs 19.0 (*)    Abs Immature Granulocytes 0.23 (*)    All other components within normal limits  URINALYSIS, ROUTINE W REFLEX MICROSCOPIC - Abnormal; Notable for the following components:   APPearance CLOUDY (*)    Hgb urine dipstick MODERATE (*)    Protein, ur 100 (*)    Nitrite POSITIVE (*)    Leukocytes,Ua LARGE (*)    WBC, UA >50 (*)    Bacteria, UA MANY (*)    All other components within normal limits  RESP PANEL BY RT-PCR (FLU A&B, COVID) ARPGX2  CULTURE, BLOOD (SINGLE)  URINE CULTURE  CULTURE, BLOOD (SINGLE)  LACTIC ACID, PLASMA  LACTIC ACID, PLASMA  PROTIME-INR  LIPASE, BLOOD  MAGNESIUM  HEMOGLOBIN A1C    EKG None  Radiology DG Chest 2 View  Result Date: 11/11/2020 CLINICAL DATA:  Cough and fever.  Nausea. EXAM: CHEST - 2 VIEW COMPARISON:  Radiograph 03/10/2015 FINDINGS: The cardiomediastinal contours are normal. Minimal chronic biapical pleuroparenchymal scarring. Pulmonary vasculature is normal. No consolidation, pleural effusion, or pneumothorax. No acute osseous abnormalities are  seen. IMPRESSION: No acute chest findings. Electronically Signed   By: Keith Rake M.D.   On: 11/11/2020 17:39   CT Abdomen Pelvis W Contrast  Result Date: 11/11/2020 CLINICAL DATA:  Right lower quadrant pain EXAM: CT ABDOMEN AND PELVIS WITH CONTRAST TECHNIQUE: Multidetector CT imaging of the abdomen and pelvis was performed using the standard protocol following bolus administration of intravenous contrast. CONTRAST:  111mL OMNIPAQUE IOHEXOL 300 MG/ML  SOLN COMPARISON:  CT 04/24/2006 FINDINGS: Lower chest: Lung bases demonstrate no acute consolidation or effusion. Normal cardiac size Hepatobiliary: No focal liver abnormality is seen. No gallstones, gallbladder wall thickening, or biliary dilatation. Pancreas: Unremarkable. No pancreatic ductal dilatation or surrounding inflammatory changes. Spleen: Choose Adrenals/Urinary Tract: Adrenal glands are normal. Kidneys show no hydronephrosis. Urothelial enhancement of right renal pelvis and diffusely involving the right ureter. No obstructing stone. The bladder is normal Stomach/Bowel: Stomach is within normal limits. Appendix appears normal. No evidence of bowel wall thickening, distention, or inflammatory changes. Sigmoid colon diverticula without acute inflammatory process. Vascular/Lymphatic: Mild aortic atherosclerosis. No aneurysm. No suspicious nodes. Reproductive: Uterus and bilateral adnexa are unremarkable. Other: Negative for free air or free fluid. Musculoskeletal: No acute or significant osseous findings. IMPRESSION: 1. Negative for acute appendicitis. 2. Urothelial enhancement of the right renal pelvis and diffusely involving the right ureter, suspicious for ascending urinary tract infection. Recommend correlation with urinalysis. 3. Sigmoid colon diverticular disease without acute inflammatory process. Aortic Atherosclerosis (ICD10-I70.0). Electronically Signed   By: Donavan Foil M.D.   On: 11/11/2020 22:58    Procedures Procedures (including  critical care time)  Medications Ordered in ED Medications  sodium chloride (PF) 0.9 % injection (has no administration in time range)  potassium chloride 10 mEq in 100 mL IVPB (10 mEq Intravenous New Bag/Given 11/12/20 0039)  insulin aspart (novoLOG) injection 0-15 Units (has no administration in time range)  cefTRIAXone (ROCEPHIN) 1 g in  sodium chloride 0.9 % 100 mL IVPB (0 g Intravenous Stopped 11/11/20 2333)  sodium chloride 0.9 % bolus 500 mL (500 mLs Intravenous New Bag/Given 11/11/20 2158)  iohexol (OMNIPAQUE) 300 MG/ML solution 100 mL (100 mLs Intravenous Contrast Given 11/11/20 2235)  sodium chloride 0.9 % bolus 500 mL (500 mLs Intravenous New Bag/Given 11/12/20 0038)    ED Course  I have reviewed the triage vital signs and the nursing notes.  Pertinent labs & imaging results that were available during my care of the patient were reviewed by me and considered in my medical decision making (see chart for details).    MDM Rules/Calculators/A&P                         patient with history of diabetes here for evaluation of lower abdominal pain, nausea, fevers and dysuria. She is non-toxic appearing on evaluation with mild lower abdominal tenderness. CBC with leukocytosis, UA consistent with UTI. She was treated with IV fluids and IV antibiotics. CT abdomen pelvis obtained, which is concerning for UTI as well. Based off of studies patient meet sepsis criteria. Discussed with patient findings of studies recommendation for mission and she is in agreement treatment plan. Hospitalist consulted for admission.  Final Clinical Impression(s) / ED Diagnoses Final diagnoses:  Acute UTI  Hypokalemia    Rx / DC Orders ED Discharge Orders    None       Quintella Reichert, MD 11/12/20 [phone]

## 2020-11-11 NOTE — ED Triage Notes (Signed)
Patient presents to the ER for complaint of cough, nausea, and fever x5 days. Patient reports she is vaccinated against COVID 19. Reports a fever of 104.5 at home.

## 2020-11-12 ENCOUNTER — Encounter (HOSPITAL_COMMUNITY): Payer: Self-pay | Admitting: Internal Medicine

## 2020-11-12 DIAGNOSIS — R062 Wheezing: Secondary | ICD-10-CM | POA: Diagnosis present

## 2020-11-12 DIAGNOSIS — E1169 Type 2 diabetes mellitus with other specified complication: Secondary | ICD-10-CM | POA: Diagnosis present

## 2020-11-12 DIAGNOSIS — I10 Essential (primary) hypertension: Secondary | ICD-10-CM | POA: Diagnosis not present

## 2020-11-12 DIAGNOSIS — E876 Hypokalemia: Secondary | ICD-10-CM | POA: Diagnosis present

## 2020-11-12 DIAGNOSIS — N390 Urinary tract infection, site not specified: Secondary | ICD-10-CM

## 2020-11-12 DIAGNOSIS — Z83438 Family history of other disorder of lipoprotein metabolism and other lipidemia: Secondary | ICD-10-CM | POA: Diagnosis not present

## 2020-11-12 DIAGNOSIS — Z833 Family history of diabetes mellitus: Secondary | ICD-10-CM | POA: Diagnosis not present

## 2020-11-12 DIAGNOSIS — A4150 Gram-negative sepsis, unspecified: Secondary | ICD-10-CM | POA: Diagnosis not present

## 2020-11-12 DIAGNOSIS — B9620 Unspecified Escherichia coli [E. coli] as the cause of diseases classified elsewhere: Secondary | ICD-10-CM

## 2020-11-12 DIAGNOSIS — K219 Gastro-esophageal reflux disease without esophagitis: Secondary | ICD-10-CM | POA: Diagnosis present

## 2020-11-12 DIAGNOSIS — F17210 Nicotine dependence, cigarettes, uncomplicated: Secondary | ICD-10-CM | POA: Diagnosis present

## 2020-11-12 DIAGNOSIS — Z20822 Contact with and (suspected) exposure to covid-19: Secondary | ICD-10-CM | POA: Diagnosis present

## 2020-11-12 DIAGNOSIS — Z716 Tobacco abuse counseling: Secondary | ICD-10-CM | POA: Diagnosis not present

## 2020-11-12 DIAGNOSIS — E782 Mixed hyperlipidemia: Secondary | ICD-10-CM | POA: Diagnosis present

## 2020-11-12 DIAGNOSIS — Z79899 Other long term (current) drug therapy: Secondary | ICD-10-CM | POA: Diagnosis not present

## 2020-11-12 DIAGNOSIS — E871 Hypo-osmolality and hyponatremia: Secondary | ICD-10-CM | POA: Diagnosis present

## 2020-11-12 DIAGNOSIS — M810 Age-related osteoporosis without current pathological fracture: Secondary | ICD-10-CM | POA: Diagnosis present

## 2020-11-12 DIAGNOSIS — Z7982 Long term (current) use of aspirin: Secondary | ICD-10-CM | POA: Diagnosis not present

## 2020-11-12 DIAGNOSIS — A4151 Sepsis due to Escherichia coli [E. coli]: Secondary | ICD-10-CM | POA: Diagnosis present

## 2020-11-12 DIAGNOSIS — Z8041 Family history of malignant neoplasm of ovary: Secondary | ICD-10-CM | POA: Diagnosis not present

## 2020-11-12 DIAGNOSIS — E119 Type 2 diabetes mellitus without complications: Secondary | ICD-10-CM | POA: Diagnosis not present

## 2020-11-12 DIAGNOSIS — E860 Dehydration: Secondary | ICD-10-CM | POA: Diagnosis not present

## 2020-11-12 DIAGNOSIS — Z8249 Family history of ischemic heart disease and other diseases of the circulatory system: Secondary | ICD-10-CM | POA: Diagnosis not present

## 2020-11-12 DIAGNOSIS — R7881 Bacteremia: Secondary | ICD-10-CM | POA: Diagnosis not present

## 2020-11-12 DIAGNOSIS — Z7984 Long term (current) use of oral hypoglycemic drugs: Secondary | ICD-10-CM | POA: Diagnosis not present

## 2020-11-12 DIAGNOSIS — Z90710 Acquired absence of both cervix and uterus: Secondary | ICD-10-CM | POA: Diagnosis not present

## 2020-11-12 HISTORY — DX: Nicotine dependence, cigarettes, uncomplicated: F17.210

## 2020-11-12 LAB — CBC WITH DIFFERENTIAL/PLATELET
Abs Immature Granulocytes: 0.12 10*3/uL — ABNORMAL HIGH (ref 0.00–0.07)
Basophils Absolute: 0.1 10*3/uL (ref 0.0–0.1)
Basophils Relative: 0 %
Eosinophils Absolute: 0 10*3/uL (ref 0.0–0.5)
Eosinophils Relative: 0 %
HCT: 35.4 % — ABNORMAL LOW (ref 36.0–46.0)
Hemoglobin: 11.7 g/dL — ABNORMAL LOW (ref 12.0–15.0)
Immature Granulocytes: 1 %
Lymphocytes Relative: 8 %
Lymphs Abs: 1.7 10*3/uL (ref 0.7–4.0)
MCH: 28.1 pg (ref 26.0–34.0)
MCHC: 33.1 g/dL (ref 30.0–36.0)
MCV: 84.9 fL (ref 80.0–100.0)
Monocytes Absolute: 0.7 10*3/uL (ref 0.1–1.0)
Monocytes Relative: 4 %
Neutro Abs: 17.5 10*3/uL — ABNORMAL HIGH (ref 1.7–7.7)
Neutrophils Relative %: 87 %
Platelets: 213 10*3/uL (ref 150–400)
RBC: 4.17 MIL/uL (ref 3.87–5.11)
RDW: 13.2 % (ref 11.5–15.5)
WBC: 20.1 10*3/uL — ABNORMAL HIGH (ref 4.0–10.5)
nRBC: 0 % (ref 0.0–0.2)

## 2020-11-12 LAB — BLOOD CULTURE ID PANEL (REFLEXED) - BCID2

## 2020-11-12 LAB — COMPREHENSIVE METABOLIC PANEL
ALT: 39 U/L (ref 0–44)
AST: 28 U/L (ref 15–41)
Albumin: 3 g/dL — ABNORMAL LOW (ref 3.5–5.0)
Alkaline Phosphatase: 78 U/L (ref 38–126)
Anion gap: 10 (ref 5–15)
BUN: 14 mg/dL (ref 8–23)
CO2: 25 mmol/L (ref 22–32)
Calcium: 8.1 mg/dL — ABNORMAL LOW (ref 8.9–10.3)
Chloride: 101 mmol/L (ref 98–111)
Creatinine, Ser: 0.55 mg/dL (ref 0.44–1.00)
GFR, Estimated: >60 mL/min (ref >60)
Glucose, Bld: 135 mg/dL — ABNORMAL HIGH (ref 70–99)
Potassium: 3.4 mmol/L — ABNORMAL LOW (ref 3.5–5.1)
Sodium: 136 mmol/L (ref 135–145)
Total Bilirubin: 1.5 mg/dL — ABNORMAL HIGH (ref 0.3–1.2)
Total Protein: 5.8 g/dL — ABNORMAL LOW (ref 6.5–8.1)

## 2020-11-12 LAB — PROTIME-INR
INR: 1.1 (ref 0.8–1.2)
Prothrombin Time: 13.8 seconds (ref 11.4–15.2)

## 2020-11-12 LAB — HEMOGLOBIN A1C
Hgb A1c MFr Bld: 6.2 % — ABNORMAL HIGH (ref 4.8–5.6)
Hgb A1c MFr Bld: 6.1 % — ABNORMAL HIGH (ref 4.8–5.6)
Mean Plasma Glucose: 131.24 mg/dL
Mean Plasma Glucose: 128.37 mg/dL

## 2020-11-12 LAB — MAGNESIUM
Magnesium: 1.8 mg/dL (ref 1.7–2.4)
Magnesium: 1.8 mg/dL (ref 1.7–2.4)

## 2020-11-12 LAB — GLUCOSE, CAPILLARY
Glucose-Capillary: 119 mg/dL — ABNORMAL HIGH (ref 70–99)
Glucose-Capillary: 120 mg/dL — ABNORMAL HIGH (ref 70–99)
Glucose-Capillary: 140 mg/dL — ABNORMAL HIGH (ref 70–99)
Glucose-Capillary: 119 mg/dL — ABNORMAL HIGH (ref 70–99)

## 2020-11-12 LAB — CORTISOL-AM, BLOOD: Cortisol - AM: 12.4 ug/dL (ref 6.7–22.6)

## 2020-11-12 LAB — PROCALCITONIN: Procalcitonin: 6.34 ng/mL

## 2020-11-12 MED ORDER — FLUOXETINE HCL 10 MG PO CAPS
10.0000 mg | ORAL_CAPSULE | Freq: Every day | ORAL | Status: DC
  Administered 2020-11-12 – 2020-11-14 (×3): 10 mg via ORAL
  Filled 2020-11-12 (×4): qty 1

## 2020-11-12 MED ORDER — ONDANSETRON HCL 4 MG/2ML IJ SOLN: 4.0000 mg | Freq: Four times a day (QID) | INTRAMUSCULAR | Status: DC | PRN

## 2020-11-12 MED ORDER — SODIUM CHLORIDE 0.9 % IV SOLN: 1.0000 g | INTRAVENOUS | Status: DC

## 2020-11-12 MED ORDER — POLYETHYLENE GLYCOL 3350 17 G PO PACK: 17.0000 g | PACK | Freq: Every day | ORAL | Status: DC | PRN

## 2020-11-12 MED ORDER — ONDANSETRON HCL 4 MG PO TABS: 4.0000 mg | ORAL_TABLET | Freq: Four times a day (QID) | ORAL | Status: DC | PRN

## 2020-11-12 MED ORDER — PANTOPRAZOLE SODIUM 40 MG PO TBEC
40.0000 mg | DELAYED_RELEASE_TABLET | Freq: Every day | ORAL | Status: DC
  Administered 2020-11-12 – 2020-11-14 (×3): 40 mg via ORAL
  Filled 2020-11-12 (×3): qty 1

## 2020-11-12 MED ORDER — AMLODIPINE BESYLATE 10 MG PO TABS
10.0000 mg | ORAL_TABLET | Freq: Every day | ORAL | Status: DC
  Administered 2020-11-12 – 2020-11-14 (×3): 10 mg via ORAL
  Filled 2020-11-12 (×3): qty 1

## 2020-11-12 MED ORDER — ACETAMINOPHEN 650 MG RE SUPP: 650.0000 mg | Freq: Four times a day (QID) | RECTAL | Status: DC | PRN

## 2020-11-12 MED ORDER — POTASSIUM CHLORIDE CRYS ER 20 MEQ PO TBCR
40.0000 meq | EXTENDED_RELEASE_TABLET | Freq: Once | ORAL | Status: AC
  Administered 2020-11-12: 40 meq via ORAL
  Filled 2020-11-12: qty 2

## 2020-11-12 MED ORDER — LISINOPRIL 20 MG PO TABS
40.0000 mg | ORAL_TABLET | Freq: Every day | ORAL | Status: DC
  Administered 2020-11-12 – 2020-11-14 (×3): 40 mg via ORAL
  Filled 2020-11-12 (×3): qty 2

## 2020-11-12 MED ORDER — SIMVASTATIN 20 MG PO TABS
20.0000 mg | ORAL_TABLET | Freq: Every day | ORAL | Status: DC
  Administered 2020-11-12 – 2020-11-13 (×2): 20 mg via ORAL
  Filled 2020-11-12 (×2): qty 1

## 2020-11-12 MED ORDER — INSULIN ASPART 100 UNIT/ML ~~LOC~~ SOLN: 0.0000 [IU] | Freq: Three times a day (TID) | SUBCUTANEOUS | Status: DC

## 2020-11-12 MED ORDER — CLONAZEPAM 0.5 MG PO TABS: 0.5000 mg | ORAL_TABLET | Freq: Every evening | ORAL | Status: DC | PRN

## 2020-11-12 MED ORDER — SODIUM CHLORIDE 0.9 % IV SOLN
2.0000 g | INTRAVENOUS | Status: DC
  Administered 2020-11-12 – 2020-11-14 (×3): 2 g via INTRAVENOUS
  Filled 2020-11-12 (×3): qty 2

## 2020-11-12 MED ORDER — SODIUM CHLORIDE 0.9 % IV SOLN: INTRAVENOUS | Status: DC

## 2020-11-12 MED ORDER — ACETAMINOPHEN 325 MG PO TABS: 650.0000 mg | ORAL_TABLET | Freq: Four times a day (QID) | ORAL | Status: DC | PRN

## 2020-11-12 MED ORDER — ENOXAPARIN SODIUM 40 MG/0.4ML ~~LOC~~ SOLN
40.0000 mg | SUBCUTANEOUS | Status: DC
  Administered 2020-11-12 – 2020-11-14 (×3): 40 mg via SUBCUTANEOUS
  Filled 2020-11-12 (×3): qty 0.4

## 2020-11-12 MED ORDER — ASPIRIN EC 81 MG PO TBEC
81.0000 mg | DELAYED_RELEASE_TABLET | Freq: Every day | ORAL | Status: DC
  Administered 2020-11-12 – 2020-11-14 (×3): 81 mg via ORAL
  Filled 2020-11-12 (×3): qty 1

## 2020-11-12 NOTE — Plan of Care (Signed)
  Problem: Education: Goal: Ability to describe self-care measures that may prevent or decrease complications (Diabetes Survival Skills Education) will improve Outcome: Progressing Goal: Individualized Educational Video(s) Outcome: Progressing   Problem: Fluid Volume: Goal: Ability to maintain a balanced intake and output will improve Outcome: Progressing   Problem: Metabolic: Goal: Ability to maintain appropriate glucose levels will improve Outcome: Progressing

## 2020-11-12 NOTE — Plan of Care (Signed)
  Problem: Education: Goal: Ability to describe self-care measures that may prevent or decrease complications (Diabetes Survival Skills Education) will improve Outcome: Progressing   Problem: Coping: Goal: Ability to adjust to condition or change in health will improve Outcome: Progressing   Problem: Nutritional: Goal: Maintenance of adequate nutrition will improve Outcome: Progressing   Problem: Respiratory: Goal: Ability to maintain adequate ventilation will improve Outcome: Progressing

## 2020-11-12 NOTE — Progress Notes (Signed)
PHARMACY - PHYSICIAN COMMUNICATION CRITICAL VALUE ALERT - BLOOD CULTURE IDENTIFICATION (BCID)  Shelly Clark is an 74 y.o. female who presented to [facility] on 11/11/2020 with a chief complaint of fever and cough, urosepsis.  Assessment: Blood culture: 3 of 4 bottles + Ecoli, no resistance gene detected  Name of physician (or Provider) Contacted: Dr. Starla Link  Current antibiotics: Ceftriaxone 1g IV q24h  Changes to prescribed antibiotics recommended:  Recommendations accepted by provider  Ceftriaxone increased to 2g IV q24h   Results for orders placed or performed during the hospital encounter of 11/11/20  Blood Culture ID Panel (Reflexed) (Collected: 11/11/2020  7:23 PM)  Result Value Ref Range   Enterococcus faecalis NOT DETECTED NOT DETECTED   Enterococcus Faecium NOT DETECTED NOT DETECTED   Listeria monocytogenes NOT DETECTED NOT DETECTED   Staphylococcus species NOT DETECTED NOT DETECTED   Staphylococcus aureus (BCID) NOT DETECTED NOT DETECTED   Staphylococcus epidermidis NOT DETECTED NOT DETECTED   Staphylococcus lugdunensis NOT DETECTED NOT DETECTED   Streptococcus species NOT DETECTED NOT DETECTED   Streptococcus agalactiae NOT DETECTED NOT DETECTED   Streptococcus pneumoniae NOT DETECTED NOT DETECTED   Streptococcus pyogenes NOT DETECTED NOT DETECTED   A.calcoaceticus-baumannii NOT DETECTED NOT DETECTED   Bacteroides fragilis NOT DETECTED NOT DETECTED   Enterobacterales DETECTED (A) NOT DETECTED   Enterobacter cloacae complex NOT DETECTED NOT DETECTED   Escherichia coli DETECTED (A) NOT DETECTED   Klebsiella aerogenes NOT DETECTED NOT DETECTED   Klebsiella oxytoca NOT DETECTED NOT DETECTED   Klebsiella pneumoniae NOT DETECTED NOT DETECTED   Proteus species NOT DETECTED NOT DETECTED   Salmonella species NOT DETECTED NOT DETECTED   Serratia marcescens NOT DETECTED NOT DETECTED   Haemophilus influenzae NOT DETECTED NOT DETECTED   Neisseria meningitidis NOT DETECTED  NOT DETECTED   Pseudomonas aeruginosa NOT DETECTED NOT DETECTED   Stenotrophomonas maltophilia NOT DETECTED NOT DETECTED   Candida albicans NOT DETECTED NOT DETECTED   Candida auris NOT DETECTED NOT DETECTED   Candida glabrata NOT DETECTED NOT DETECTED   Candida krusei NOT DETECTED NOT DETECTED   Candida parapsilosis NOT DETECTED NOT DETECTED   Candida tropicalis NOT DETECTED NOT DETECTED   Cryptococcus neoformans/gattii NOT DETECTED NOT DETECTED   CTX-M ESBL NOT DETECTED NOT DETECTED   Carbapenem resistance IMP NOT DETECTED NOT DETECTED   Carbapenem resistance KPC NOT DETECTED NOT DETECTED   Carbapenem resistance NDM NOT DETECTED NOT DETECTED   Carbapenem resist OXA 48 LIKE NOT DETECTED NOT DETECTED   Carbapenem resistance VIM NOT DETECTED NOT DETECTED    Gretta Arab PharmD, BCPS Clinical Pharmacist [facility] main pharmacy [phone] 11/12/2020 2:08 PM

## 2020-11-12 NOTE — H&P (Signed)
History and Physical    Shelly Clark OTL:[id number] DOB: 06/25/1946 DOA: 11/11/2020  PCP: [name], NP  Patient coming from: Home   Chief Complaint:  Chief Complaint  Patient presents with  . Fever  . Cough     HPI:    74 year old female with past medical history of diabetes mellitus type 2, gastroesophageal reflux disease, hypertension, hyperlipidemia with a long hospital emergency department with complaints of abdominal pain and fever.  Patient explains that for approximately the past month she has been experiencing intermittent lower abdominal pain.  Initially this pain was mild in intensity but in the past several days in particular, pain has become more severe.  Pain is sharp in quality, mostly located in the right lower quadrant and nonradiating.  Patient is complaining of associated generalized weakness and dysuria.  Patient is also complaining of poor appetite that has been worsening over the span of time.  As of late, patient has been also began to develop fevers and reports a fever of 104.5 F the evening of 12/16.    Due to worsening weakness, abdominal pain and fevers patient eventually presented to [facility] emergency department for evaluation.  Upon evaluation in the emergency department, patient been found to have substantial leukocytosis as well as intermittent tachypnea and tachycardia with fevers as mentioned above concerning for early sepsis.  Patient was initiated on intravenous fluids as well as intravenous ceftriaxone.  CT imaging of the abdomen pelvis was also performed revealing urothelial enhancement of the right renal pelvis and right ureter concerning for ascending urinary tract infection.  The hospitalist group was then called to assess the patient for admission to the hospital.  Review of Systems:   Review of Systems  Constitutional: Positive for fever and malaise/fatigue.  Gastrointestinal: Positive for abdominal pain, nausea and  vomiting.  Genitourinary: Positive for dysuria.  Neurological: Positive for weakness.  All other systems reviewed and are negative.   Past Medical History:  Diagnosis Date  . Benign hematuria    neg urology workup  . Diabetes mellitus without complication ([place])   . Esophageal dysmotility   . Hyperlipidemia   . Hypertension   . Migraines   . Nicotine dependence, cigarettes, uncomplicated [id number]  . Osteoporosis   . Tobacco abuse     Past Surgical History:  Procedure Laterality Date  . ABDOMINAL HYSTERECTOMY       reports that she has been smoking cigarettes. She has a 40.00 pack-year smoking history. She has never used smokeless tobacco. She reports that she does not drink alcohol and does not use drugs.  Allergies  Allergen Reactions  . Codeine Nausea And Vomiting    Family History  Problem Relation Age of Onset  . Ovarian cancer Mother   . Diabetes Mother   . Hypertension Mother   . Hyperlipidemia Mother   . Diabetes Father   . Hypertension Father   . Hyperlipidemia Father   . Hypertension Brother   . Hypertension Daughter   . Hypertension Daughter      Prior to Admission medications   Medication Sig Start Date End Date Taking? Authorizing Provider  amLODipine (NORVASC) 10 MG tablet TAKE 1 TABLET(10 MG) BY MOUTH DAILY 04/16/20  Yes Nafziger, Tommi Rumps, NP  aspirin EC 81 MG tablet Take 81 mg by mouth daily.   Yes [provider]  B Complex-C (B-COMPLEX WITH VITAMIN C) tablet Take 1 tablet by mouth daily.   Yes [provider]  clonazePAM (KLONOPIN) 0.5 MG tablet Take  1 tablet (0.5 mg total) by mouth at bedtime as needed. for sleep 04/16/20  Yes Nafziger, Tommi Rumps, NP  FLUoxetine (PROZAC) 10 MG capsule TAKE 1 CAPSULE(10 MG) BY MOUTH DAILY Patient taking differently: Take 10 mg by mouth daily. 11/05/20  Yes Nafziger, Tommi Rumps, NP  glipiZIDE (GLUCOTROL XL) 5 MG 24 hr tablet TAKE 1 TABLET(5 MG) BY MOUTH DAILY WITH BREAKFAST Patient taking differently: Take 5  mg by mouth daily with breakfast. 11/05/20  Yes Nafziger, Tommi Rumps, NP  hydrochlorothiazide (MICROZIDE) 12.5 MG capsule TAKE 1 CAPSULE(12.5 MG) BY MOUTH DAILY Patient taking differently: Take 12.5 mg by mouth daily. 11/05/20  Yes Nafziger, Tommi Rumps, NP  lisinopril (ZESTRIL) 40 MG tablet TAKE 1 TABLET(40 MG) BY MOUTH DAILY Patient taking differently: Take 40 mg by mouth daily. 01/02/20  Yes Nafziger, Tommi Rumps, NP  metFORMIN (GLUCOPHAGE) 500 MG tablet TAKE 1 TABLET BY MOUTH TWICE DAILY WITH A MEAL Patient taking differently: Take 500 mg by mouth 2 (two) times daily with a meal. 11/05/20  Yes Nafziger, Tommi Rumps, NP  Multiple Vitamin (MULTIVITAMIN WITH MINERALS) TABS Take 1 tablet by mouth daily.   Yes [provider]  pantoprazole (PROTONIX) 20 MG tablet Take 1 tablet (20 mg total) by mouth daily. 11/08/20  Yes Wardell Honour, MD  simvastatin (ZOCOR) 20 MG tablet TAKE 1 TABLET BY MOUTH EVERY DAY Patient taking differently: Take 20 mg by mouth daily at 6 PM. 01/14/20  Yes Nafziger, Tommi Rumps, NP  ciprofloxacin (CIPRO) 500 MG tablet Take 1 tablet (500 mg total) by mouth 2 (two) times daily. Patient not taking: No sig reported 07/29/20   [name], NP  triamcinolone ointment (KENALOG) 0.5 % Apply 1 application topically 2 (two) times daily. Patient not taking: Reported on 11/12/2020 12/04/19   [name], NP    Physical Exam: Vitals:   11/11/20 2330 11/12/20 0112 11/12/20 0116 11/12/20 0633  BP: (!) 191/80  (!) 159/75 130/60  Pulse: (!) 107  99 79  Resp: (!) 28  20 18   Temp:   98.5 F (36.9 C) 98.3 F (36.8 C)  TempSrc:   Oral Oral  SpO2: 95%  96% 96%  Weight:  56.2 kg    Height:  5' (1.524 m)      Constitutional: Patient is lethargic but arousable and oriented x3.  No associated distress.   Skin: no rashes, no lesions, notable poor skin turgor Eyes: Pupils are equally reactive to light.  No evidence of scleral icterus or conjunctival pallor.  ENMT: Dry mucous membranes noted.  Posterior  pharynx clear of any exudate or lesions.   Neck: normal, supple, no masses, no thyromegaly.  No evidence of jugular venous distension.   Respiratory: clear to auscultation bilaterally, no wheezing, no crackles. Normal respiratory effort. No accessory muscle use.  Cardiovascular: Tachycardic rate with regular rhythm, no murmurs / rubs / gallops. No extremity edema. 2+ pedal pulses. No carotid bruits.  Chest:   Nontender without crepitus or deformity.   Back:   Nontender without crepitus or deformity. Abdomen: Notable lower abdominal tenderness soft however.  No evidence of intra-abdominal masses.  Positive bowel sounds noted in all quadrants.   Musculoskeletal: No joint deformity upper and lower extremities. Good ROM, no contractures. Normal muscle tone.  Neurologic: CN 2-12 grossly intact. Sensation intact.  Patient moving all 4 extremities spontaneously.  Patient is following all commands.  Patient is responsive to verbal stimuli.   Psychiatric: Patient exhibits normal mood with appropriate affect.  Patient seems to possess insight as to their  current situation.     Labs on Admission: I have personally reviewed following labs and imaging studies -   CBC: Recent Labs  Lab 11/11/20 1923 11/12/20 0557  WBC 21.4* 20.1*  NEUTROABS 19.0* 17.5*  HGB 13.1 11.7*  HCT 38.5 35.4*  MCV 84.1 84.9  PLT 251 557   Basic Metabolic Panel: Recent Labs  Lab 11/08/20 1517 11/11/20 1923 11/12/20 0557  NA 131* 129* 136  K 3.9 2.8* 3.4*  CL 94* 91* 101  CO2 24 26 25   GLUCOSE 134* 145* 135*  BUN 13 19 14   CREATININE 0.50* 0.68 0.55  CALCIUM 9.6 8.6* 8.1*  MG  --  1.8 1.8   GFR: Estimated Creatinine Clearance: 50 mL/min (by C-G formula based on SCr of 0.55 mg/dL). Liver Function Tests: Recent Labs  Lab 11/08/20 1517 11/11/20 1923 11/12/20 0557  AST 25 48* 28  ALT 22 55* 39  ALKPHOS  --  96 78  BILITOT 0.5 1.3* 1.5*  PROT 6.6 6.8 5.8*  ALBUMIN  --  3.7 3.0*   Recent Labs  Lab  11/11/20 1923  LIPASE 24   No results for input(s): AMMONIA in the last 168 hours. Coagulation Profile: Recent Labs  Lab 11/11/20 1923 11/12/20 0557  INR 1.0 1.1   Cardiac Enzymes: No results for input(s): CKTOTAL, CKMB, CKMBINDEX, TROPONINI in the last 168 hours. BNP (last 3 results) No results for input(s): PROBNP in the last 8760 hours. HbA1C: Recent Labs    11/11/20 1923  HGBA1C 6.2*   CBG: Recent Labs  Lab 11/12/20 0718  GLUCAP 119*   Lipid Profile: No results for input(s): CHOL, HDL, LDLCALC, TRIG, CHOLHDL, LDLDIRECT in the last 72 hours. Thyroid Function Tests: No results for input(s): TSH, T4TOTAL, FREET4, T3FREE, THYROIDAB in the last 72 hours. Anemia Panel: No results for input(s): VITAMINB12, FOLATE, FERRITIN, TIBC, IRON, RETICCTPCT in the last 72 hours. Urine analysis:    Component Value Date/Time   COLORURINE YELLOW 11/11/2020 1923   APPEARANCEUR CLOUDY (A) 11/11/2020 1923   LABSPEC 1.012 11/11/2020 1923   PHURINE 6.0 11/11/2020 1923   GLUCOSEU NEGATIVE 11/11/2020 1923   GLUCOSEU NEGATIVE 05/07/2020 0928   HGBUR MODERATE (A) 11/11/2020 1923   HGBUR large 11/17/2010 [place] 11/11/2020 1923   BILIRUBINUR neg 07/29/2020 1435   KETONESUR NEGATIVE 11/11/2020 1923   PROTEINUR 100 (A) 11/11/2020 1923   UROBILINOGEN 0.2 07/29/2020 1435   UROBILINOGEN 0.2 05/07/2020 0928   NITRITE POSITIVE (A) 11/11/2020 1923   LEUKOCYTESUR LARGE (A) 11/11/2020 1923    Radiological Exams on Admission - Personally Reviewed: DG Chest 2 View  Result Date: 11/11/2020 CLINICAL DATA:  Cough and fever.  Nausea. EXAM: CHEST - 2 VIEW COMPARISON:  Radiograph 03/10/2015 FINDINGS: The cardiomediastinal contours are normal. Minimal chronic biapical pleuroparenchymal scarring. Pulmonary vasculature is normal. No consolidation, pleural effusion, or pneumothorax. No acute osseous abnormalities are seen. IMPRESSION: No acute chest findings. Electronically Signed   By:  Keith Rake M.D.   On: 11/11/2020 17:39   CT Abdomen Pelvis W Contrast  Result Date: 11/11/2020 CLINICAL DATA:  Right lower quadrant pain EXAM: CT ABDOMEN AND PELVIS WITH CONTRAST TECHNIQUE: Multidetector CT imaging of the abdomen and pelvis was performed using the standard protocol following bolus administration of intravenous contrast. CONTRAST:  17mL OMNIPAQUE IOHEXOL 300 MG/ML  SOLN COMPARISON:  CT 04/24/2006 FINDINGS: Lower chest: Lung bases demonstrate no acute consolidation or effusion. Normal cardiac size Hepatobiliary: No focal liver abnormality is seen. No gallstones, gallbladder wall  thickening, or biliary dilatation. Pancreas: Unremarkable. No pancreatic ductal dilatation or surrounding inflammatory changes. Spleen: Choose Adrenals/Urinary Tract: Adrenal glands are normal. Kidneys show no hydronephrosis. Urothelial enhancement of right renal pelvis and diffusely involving the right ureter. No obstructing stone. The bladder is normal Stomach/Bowel: Stomach is within normal limits. Appendix appears normal. No evidence of bowel wall thickening, distention, or inflammatory changes. Sigmoid colon diverticula without acute inflammatory process. Vascular/Lymphatic: Mild aortic atherosclerosis. No aneurysm. No suspicious nodes. Reproductive: Uterus and bilateral adnexa are unremarkable. Other: Negative for free air or free fluid. Musculoskeletal: No acute or significant osseous findings. IMPRESSION: 1. Negative for acute appendicitis. 2. Urothelial enhancement of the right renal pelvis and diffusely involving the right ureter, suspicious for ascending urinary tract infection. Recommend correlation with urinalysis. 3. Sigmoid colon diverticular disease without acute inflammatory process. Aortic Atherosclerosis (ICD10-I70.0). Electronically Signed   By: Donavan Foil M.D.   On: 11/11/2020 22:58    Telemetry: Personally reviewed.  Rhythm is sinus rhythm heart rate of 95  bpm.  Assessment/Plan Principal Problem:   Sepsis due to gram-negative UTI [facility])   Patient presenting with multiple sirs criteria in the setting of evidence of an ascending urinary tract infection with concerns for early sepsis  Patient has been initiated on intravenous ceftriaxone for coverage of likely gram-negative organism  Patient is being hydrated with intravenous isotonic fluids.  Blood and urine cultures have been ordered  We will de-escalate antibiotic therapy based on culture results.  Close conical monitoring  Active Problems:   Essential hypertension   Continue home regimen of antihypertensive therapy while monitoring for episodic hypotension considering ongoing concerns for infection    Dehydration with hyponatremia   Patient exhibiting mild hyponatremia, likely secondary to volume depletion  Hydrating patient with intravenous isotonic fluids  Monitoring sodium levels with serial chemistries    Nicotine dependence, cigarettes, uncomplicated   Counseling patient daily on smoking cessation    Hypokalemia, inadequate intake   Notable hypokalemia, likely secondary to poor oral intake  Replacing potassium with both oral and intravenous means  Monitoring potassium levels with serial chemistries    Mixed hyperlipidemia due to type 2 diabetes mellitus ([place])   Continue home regimen of statin therapy    GERD without esophagitis    Continue home regimen of daily PPI   Code Status:  Full code Family Communication: Deferred  Status is: Observation  The patient remains OBS appropriate and will d/c before 2 midnights.  Dispo: The patient is from: Home              Anticipated d/c is to: Home              Anticipated d/c date is: 2 days              Patient currently is not medically stable to d/c.        Vernelle Emerald MD [facility] Pager [phone]  If 7PM-7AM, please contact night-coverage [email or web address] Use universal [facility] password for that web site. If you do not have the password, please call the hospital operator.  11/12/2020, 7:42 AM

## 2020-11-12 NOTE — Progress Notes (Signed)
Patient ID: Shelly Clark, female   DOB: 03-Dec-1945, 74 y.o.   MRN: [id number] Patient admitted early this morning for fever and cough and has been started on IV fluids and antibiotics for sepsis due to UTI.  Patient seen and examined at bedside and plan of care discussed with her.  I have reviewed patient's medical records including this morning's H&P, current vitals, labs and medications myself.  Continue current plan of care including IV fluids and antibiotics.  Follow cultures.

## 2020-11-13 DIAGNOSIS — K219 Gastro-esophageal reflux disease without esophagitis: Secondary | ICD-10-CM

## 2020-11-13 DIAGNOSIS — E1169 Type 2 diabetes mellitus with other specified complication: Secondary | ICD-10-CM

## 2020-11-13 DIAGNOSIS — R062 Wheezing: Secondary | ICD-10-CM

## 2020-11-13 DIAGNOSIS — E860 Dehydration: Secondary | ICD-10-CM

## 2020-11-13 DIAGNOSIS — R7881 Bacteremia: Secondary | ICD-10-CM

## 2020-11-13 DIAGNOSIS — E876 Hypokalemia: Secondary | ICD-10-CM

## 2020-11-13 DIAGNOSIS — B9620 Unspecified Escherichia coli [E. coli] as the cause of diseases classified elsewhere: Secondary | ICD-10-CM

## 2020-11-13 DIAGNOSIS — F17210 Nicotine dependence, cigarettes, uncomplicated: Secondary | ICD-10-CM

## 2020-11-13 DIAGNOSIS — E871 Hypo-osmolality and hyponatremia: Secondary | ICD-10-CM

## 2020-11-13 DIAGNOSIS — E782 Mixed hyperlipidemia: Secondary | ICD-10-CM

## 2020-11-13 DIAGNOSIS — I10 Essential (primary) hypertension: Secondary | ICD-10-CM

## 2020-11-13 LAB — HEPATIC FUNCTION PANEL
ALT: 34 U/L (ref 0–44)
AST: 24 U/L (ref 15–41)
Albumin: 3.2 g/dL — ABNORMAL LOW (ref 3.5–5.0)
Alkaline Phosphatase: 81 U/L (ref 38–126)
Bilirubin, Direct: 0.1 mg/dL (ref 0.0–0.2)
Indirect Bilirubin: 0.5 mg/dL (ref 0.3–0.9)
Total Bilirubin: 0.6 mg/dL (ref 0.3–1.2)
Total Protein: 6.3 g/dL — ABNORMAL LOW (ref 6.5–8.1)

## 2020-11-13 LAB — CBC WITH DIFFERENTIAL/PLATELET
Abs Immature Granulocytes: 0.06 10*3/uL (ref 0.00–0.07)
Basophils Absolute: 0 10*3/uL (ref 0.0–0.1)
Basophils Relative: 0 %
Eosinophils Absolute: 0.1 10*3/uL (ref 0.0–0.5)
Eosinophils Relative: 1 %
HCT: 36.7 % (ref 36.0–46.0)
Hemoglobin: 12 g/dL (ref 12.0–15.0)
Immature Granulocytes: 1 %
Lymphocytes Relative: 15 %
Lymphs Abs: 2 10*3/uL (ref 0.7–4.0)
MCH: 28.2 pg (ref 26.0–34.0)
MCHC: 32.7 g/dL (ref 30.0–36.0)
MCV: 86.4 fL (ref 80.0–100.0)
Monocytes Absolute: 0.9 10*3/uL (ref 0.1–1.0)
Monocytes Relative: 7 %
Neutro Abs: 10.2 10*3/uL — ABNORMAL HIGH (ref 1.7–7.7)
Neutrophils Relative %: 76 %
Platelets: 226 10*3/uL (ref 150–400)
RBC: 4.25 MIL/uL (ref 3.87–5.11)
RDW: 13.5 % (ref 11.5–15.5)
WBC: 13.2 10*3/uL — ABNORMAL HIGH (ref 4.0–10.5)
nRBC: 0 % (ref 0.0–0.2)

## 2020-11-13 LAB — BASIC METABOLIC PANEL
Anion gap: 11 (ref 5–15)
BUN: 13 mg/dL (ref 8–23)
CO2: 24 mmol/L (ref 22–32)
Calcium: 8.4 mg/dL — ABNORMAL LOW (ref 8.9–10.3)
Chloride: 101 mmol/L (ref 98–111)
Creatinine, Ser: 0.55 mg/dL (ref 0.44–1.00)
GFR, Estimated: >60 mL/min (ref >60)
Glucose, Bld: 125 mg/dL — ABNORMAL HIGH (ref 70–99)
Potassium: 3.4 mmol/L — ABNORMAL LOW (ref 3.5–5.1)
Sodium: 136 mmol/L (ref 135–145)

## 2020-11-13 LAB — URINE CULTURE: Culture: >=100000 — AB

## 2020-11-13 LAB — GLUCOSE, CAPILLARY
Glucose-Capillary: 116 mg/dL — ABNORMAL HIGH (ref 70–99)
Glucose-Capillary: 117 mg/dL — ABNORMAL HIGH (ref 70–99)
Glucose-Capillary: 120 mg/dL — ABNORMAL HIGH (ref 70–99)
Glucose-Capillary: 157 mg/dL — ABNORMAL HIGH (ref 70–99)

## 2020-11-13 LAB — MAGNESIUM: Magnesium: 2 mg/dL (ref 1.7–2.4)

## 2020-11-13 MED ORDER — IPRATROPIUM-ALBUTEROL 0.5-2.5 (3) MG/3ML IN SOLN
3.0000 mL | Freq: Three times a day (TID) | RESPIRATORY_TRACT | Status: DC
  Administered 2020-11-13: 3 mL via RESPIRATORY_TRACT
  Filled 2020-11-13: qty 3

## 2020-11-13 MED ORDER — NICOTINE 21 MG/24HR TD PT24
21.0000 mg | MEDICATED_PATCH | Freq: Every day | TRANSDERMAL | Status: DC
  Administered 2020-11-13 – 2020-11-14 (×2): 21 mg via TRANSDERMAL
  Filled 2020-11-13 (×2): qty 1

## 2020-11-13 MED ORDER — ALBUTEROL SULFATE (2.5 MG/3ML) 0.083% IN NEBU: 2.5000 mg | INHALATION_SOLUTION | RESPIRATORY_TRACT | Status: DC | PRN

## 2020-11-13 MED ORDER — MOMETASONE FURO-FORMOTEROL FUM 100-5 MCG/ACT IN AERO
2.0000 | INHALATION_SPRAY | Freq: Two times a day (BID) | RESPIRATORY_TRACT | Status: DC
  Administered 2020-11-13 – 2020-11-14 (×2): 2 via RESPIRATORY_TRACT
  Filled 2020-11-13: qty 8.8

## 2020-11-13 MED ORDER — IPRATROPIUM-ALBUTEROL 0.5-2.5 (3) MG/3ML IN SOLN
3.0000 mL | Freq: Two times a day (BID) | RESPIRATORY_TRACT | Status: DC
  Administered 2020-11-14: 3 mL via RESPIRATORY_TRACT
  Filled 2020-11-13: qty 3

## 2020-11-13 MED ORDER — POTASSIUM CHLORIDE CRYS ER 20 MEQ PO TBCR
40.0000 meq | EXTENDED_RELEASE_TABLET | Freq: Once | ORAL | Status: AC
  Administered 2020-11-13: 40 meq via ORAL
  Filled 2020-11-13: qty 2

## 2020-11-13 NOTE — Progress Notes (Signed)
PROGRESS NOTE    Shelly [place]  YHC:[id number] DOB: [birth year] DOA: 11/11/2020 PCP: [name], NP    Chief Complaint  Patient presents with  . Fever  . Cough    Brief Narrative:  Patient pleasant 74 year old female history of type 2 diabetes, GERD, hypertension, hyperlipidemia presented to the ED with abdominal pain and fever, as high as 104.5 the evening of 11/11/2020, generalized weakness and dysuria. Patient noted on presentation to be septic with a leukocytosis, tachypnea, tachycardia, fevers.  CT abdomen and pelvis done revealed urothelial enhancement of the right renal pelvis and right ureter concerning for ascending UTI.  Patient pancultured and placed empirically on IV Rocephin.  Urine cultures and blood cultures positive for E. coli.   Assessment & Plan:   Principal Problem:   Sepsis due to gram-negative UTI ([place]) Active Problems:   E coli bacteremia   Essential hypertension   Dehydration with hyponatremia   Mixed hyperlipidemia due to type 2 diabetes mellitus (HCC)   GERD without esophagitis   Nicotine dependence, cigarettes, uncomplicated   Hypokalemia, inadequate intake   E. coli UTI   Hypokalemia   Wheezing on expiration   1 sepsis secondary to E. coli bacteremia and E. coli UTI Patient presented with criteria for sepsis with leukocytosis, fever, cardiac, tachypnea, CT abdomen and pelvis done concerning for ascending UTI.  Patient pancultured with blood cultures positive for E. coli, urine cultures positive for E. coli.  Patient with clinical improvement.  Leukocytosis trending down.  Currently afebrile.  Rocephin dose adjusted to 2 g IV daily which we will continue for now.  If continued improvement could likely transition to oral antibiotics in the next 1 to 2 days.  Supportive care.  Follow.  2.  Dehydration with hyponatremia Hyponatremia secondary to volume depletion and dehydration.  Hyponatremia improving with hydration.  Continue IV fluids.   Supportive care.  3.  Hypertension Stable.  Continue Norvasc lisinopril.  4.  Expiratory wheezing Patient with some minimal expiratory wheezing.  Patient with an extensive tobacco history.  Tobacco cessation stressed to patient.  Placed on Dulera, scheduled duo nebs.  Follow.  5.  Tobacco abuse Tobacco cessation stressed to patient.  Nicotine patch.  6.  Hypokalemia Replete.  7.  Gastroesophageal reflux disease PPI.  8.  Hyperlipidemia Continue statin.  9.  Well-controlled type 2 diabetes mellitus Hemoglobin A1c 6.1 (11/12/2020).  CBG 116 this morning.  Sliding scale insulin.   DVT prophylaxis: Lovenox Code Status: Full Family Communication: Updated patient and daughter at bedside. Disposition:   Status is: Inpatient    Dispo: The patient is from: Home              Anticipated d/c is to: Home              Anticipated d/c date is: 1 to 2 days.              Patient currently on IV antibiotics, with sepsis secondary to E. coli bacteremia and UTI.  Not stable for discharge.       Consultants:   None  Procedures:   CT abdomen and pelvis 11/11/2020  Chest x-ray 11/11/2020  Antimicrobials:   IV Rocephin 11/12/2020>>>>   Subjective: Patient laying in bed.  Daughter at bedside.  Patient asking when she can go home.  Patient states abdominal discomfort has improved since admission.  No nausea or vomiting.  No chest pain.  No shortness of breath.  Feeling better than on admission.  Objective: Vitals:  11/13/20 0507 11/13/20 0926 11/13/20 1405 11/13/20 1519  BP: (!) 163/80 (!) 164/71 (!) 141/70   Pulse: 82 83 82   Resp: 20 20 20    Temp: 98.9 F (37.2 C) 98.7 F (37.1 C) 99.4 F (37.4 C)   TempSrc: Oral Oral Oral   SpO2: 94% 95% 94% 95%  Weight:      Height:        Intake/Output Summary (Last 24 hours) at 11/13/2020 1632 Last data filed at 11/13/2020 1300 Gross per 24 hour  Intake 962.36 ml  Output 1000 ml  Net -37.64 ml   Filed Weights    11/12/20 0112 11/13/20 0500  Weight: 56.2 kg 57.9 kg    Examination:  General exam: Appears calm and comfortable  Respiratory system: Minimal expiratory wheezing.  No rhonchi noted.  Speaking in full sentences.  Cardiovascular system: S1 & S2 heard, RRR. No JVD, murmurs, rubs, gallops or clicks. No pedal edema. Gastrointestinal system: Abdomen is nondistended, soft and nontender. No organomegaly or masses felt. Normal bowel sounds heard. Central nervous system: Alert and oriented. No focal neurological deficits. Extremities: Symmetric 5 x 5 power. Skin: No rashes, lesions or ulcers Psychiatry: Judgement and insight appear normal. Mood & affect appropriate.     Data Reviewed: I have personally reviewed following labs and imaging studies  CBC: Recent Labs  Lab 11/11/20 1923 11/12/20 0557 11/13/20 0550  WBC 21.4* 20.1* 13.2*  NEUTROABS 19.0* 17.5* 10.2*  HGB 13.1 11.7* 12.0  HCT 38.5 35.4* 36.7  MCV 84.1 84.9 86.4  PLT 251 213 630    Basic Metabolic Panel: Recent Labs  Lab 11/08/20 1517 11/11/20 1923 11/12/20 0557 11/13/20 0550  NA 131* 129* 136 136  K 3.9 2.8* 3.4* 3.4*  CL 94* 91* 101 101  CO2 24 26 25 24   GLUCOSE 134* 145* 135* 125*  BUN 13 19 14 13   CREATININE 0.50* 0.68 0.55 0.55  CALCIUM 9.6 8.6* 8.1* 8.4*  MG  --  1.8 1.8 2.0    GFR: Estimated Creatinine Clearance: 50.7 mL/min (by C-G formula based on SCr of 0.55 mg/dL).  Liver Function Tests: Recent Labs  Lab 11/08/20 1517 11/11/20 1923 11/12/20 0557 11/13/20 0550  AST 25 48* 28 24  ALT 22 55* 39 34  ALKPHOS  --  96 78 81  BILITOT 0.5 1.3* 1.5* 0.6  PROT 6.6 6.8 5.8* 6.3*  ALBUMIN  --  3.7 3.0* 3.2*    CBG: Recent Labs  Lab 11/12/20 1638 11/12/20 2247 11/13/20 0727 11/13/20 1120 11/13/20 1625  GLUCAP 140* 119* 116* 117* 120*     Recent Results (from the past 240 hour(s))  Culture, blood (single)     Status: Abnormal (Preliminary result)   Collection Time: 11/11/20  7:23 PM    Specimen: BLOOD  Result Value Ref Range Status   Specimen Description   Final    BLOOD LEFT ANTECUBITAL Performed at [facility], [place] [address], [place], [place] [address]    Special Requests   Final    BOTTLES DRAWN AEROBIC AND ANAEROBIC Blood Culture results may not be optimal due to an excessive volume of blood received in culture bottles Performed at [place] [address] [place], [place] [address]    Culture  Setup Time   Final    GRAM NEGATIVE RODS ANAEROBIC BOTTLE ONLY Organism ID to follow CRITICAL RESULT CALLED TO, READ BACK BY AND VERIFIED WITH: Christean Grief PharmD 14:00 11/12/20 ([email or web address]) Performed at [facility]  Lab, [address], [place], [place] [address]    Culture ESCHERICHIA COLI (A)  Final   Report Status PENDING  Incomplete  Resp Panel by RT-PCR (Flu A&B, Covid) Nasopharyngeal Swab     Status: None   Collection Time: 11/11/20  7:23 PM   Specimen: Nasopharyngeal Swab; Nasopharyngeal(NP) swabs in vial transport medium  Result Value Ref Range Status   SARS Coronavirus 2 by RT PCR NEGATIVE NEGATIVE Final    Comment: (NOTE) SARS-CoV-2 target nucleic acids are NOT DETECTED.  The SARS-CoV-2 RNA is generally detectable in upper respiratory specimens during the acute phase of infection. The lowest concentration of SARS-CoV-2 viral copies this assay can detect is 138 copies/mL. A negative result does not preclude SARS-Cov-2 infection and should not be used as the sole basis for treatment or other patient management decisions. A negative result may occur with  improper specimen collection/handling, submission of specimen other than nasopharyngeal swab, presence of viral mutation(s) within the areas targeted by this assay, and inadequate number of viral copies(<138 copies/mL). A negative result must be combined with clinical observations, patient history, and epidemiological information. The expected result is  Negative.  Fact Sheet for Patients:  EntrepreneurPulse.com.au  Fact Sheet for Healthcare Providers:  IncredibleEmployment.be  This test is no t yet approved or cleared by the [place] FDA and  has been authorized for detection and/or diagnosis of SARS-CoV-2 by FDA under an Emergency Use Authorization (EUA). This EUA will remain  in effect (meaning this test can be used) for the duration of the COVID-19 declaration under Section 564(b)(1) of the Act, 21 U.S.C.section 360bbb-3(b)(1), unless the authorization is terminated  or revoked sooner.       Influenza A by PCR NEGATIVE NEGATIVE Final   Influenza B by PCR NEGATIVE NEGATIVE Final    Comment: (NOTE) The Xpert Xpress SARS-CoV-2/FLU/RSV plus assay is intended as an aid in the diagnosis of influenza from Nasopharyngeal swab specimens and should not be used as a sole basis for treatment. Nasal washings and aspirates are unacceptable for Xpert Xpress SARS-CoV-2/FLU/RSV testing.  Fact Sheet for Patients: EntrepreneurPulse.com.au  Fact Sheet for Healthcare Providers: IncredibleEmployment.be  This test is not yet approved or cleared by the [place] FDA and has been authorized for detection and/or diagnosis of SARS-CoV-2 by FDA under an Emergency Use Authorization (EUA). This EUA will remain in effect (meaning this test can be used) for the duration of the COVID-19 declaration under Section 564(b)(1) of the Act, 21 U.S.C. section 360bbb-3(b)(1), unless the authorization is terminated or revoked.  Performed at [facility], [place] [address] [place] [address], [place], [place] [address]   Urine culture     Status: Abnormal   Collection Time: 11/11/20  7:23 PM   Specimen: Urine, Clean Catch  Result Value Ref Range Status   Specimen Description   Final    URINE, CLEAN CATCH Performed at [facility], [place] [address],  [place], [place] [address]    Special Requests   Final    NONE Performed at [facility], [place] [address], [place], [place] [address]    Culture >=100,000 COLONIES/mL ESCHERICHIA COLI (A)  Final   Report Status 11/13/2020 FINAL  Final   Organism ID, Bacteria ESCHERICHIA COLI (A)  Final      Susceptibility   Escherichia coli - MIC*    AMPICILLIN >=32 RESISTANT Resistant     CEFAZOLIN <=4 SENSITIVE Sensitive     CEFEPIME <=0.12 SENSITIVE Sensitive     CEFTRIAXONE <=  0.25 SENSITIVE Sensitive     CIPROFLOXACIN <=0.25 SENSITIVE Sensitive     GENTAMICIN >=16 RESISTANT Resistant     IMIPENEM <=0.25 SENSITIVE Sensitive     NITROFURANTOIN <=16 SENSITIVE Sensitive     TRIMETH/SULFA <=20 SENSITIVE Sensitive     AMPICILLIN/SULBACTAM 16 INTERMEDIATE Intermediate     PIP/TAZO <=4 SENSITIVE Sensitive     * >=100,000 COLONIES/mL ESCHERICHIA COLI  Blood Culture ID Panel (Reflexed)     Status: Abnormal   Collection Time: 11/11/20  7:23 PM  Result Value Ref Range Status   Enterococcus faecalis NOT DETECTED NOT DETECTED Final   Enterococcus Faecium NOT DETECTED NOT DETECTED Final   Listeria monocytogenes NOT DETECTED NOT DETECTED Final   Staphylococcus species NOT DETECTED NOT DETECTED Final   Staphylococcus aureus (BCID) NOT DETECTED NOT DETECTED Final   Staphylococcus epidermidis NOT DETECTED NOT DETECTED Final   Staphylococcus lugdunensis NOT DETECTED NOT DETECTED Final   Streptococcus species NOT DETECTED NOT DETECTED Final   Streptococcus agalactiae NOT DETECTED NOT DETECTED Final   Streptococcus pneumoniae NOT DETECTED NOT DETECTED Final   Streptococcus pyogenes NOT DETECTED NOT DETECTED Final   A.calcoaceticus-baumannii NOT DETECTED NOT DETECTED Final   Bacteroides fragilis NOT DETECTED NOT DETECTED Final   Enterobacterales DETECTED (A) NOT DETECTED Final    Comment: Enterobacterales represent a large order of gram negative bacteria, not a single organism. CRITICAL RESULT  CALLED TO, READ BACK BY AND VERIFIED WITH: Christean Grief PharmD 14:00 11/12/20 ([email or web address])    Enterobacter cloacae complex NOT DETECTED NOT DETECTED Final   Escherichia coli DETECTED (A) NOT DETECTED Final    Comment: CRITICAL RESULT CALLED TO, READ BACK BY AND VERIFIED WITH: Christean Grief PharmD 14:00 11/12/20 ([email or web address])    Klebsiella aerogenes NOT DETECTED NOT DETECTED Final   Klebsiella oxytoca NOT DETECTED NOT DETECTED Final   Klebsiella pneumoniae NOT DETECTED NOT DETECTED Final   Proteus species NOT DETECTED NOT DETECTED Final   Salmonella species NOT DETECTED NOT DETECTED Final   Serratia marcescens NOT DETECTED NOT DETECTED Final   Haemophilus influenzae NOT DETECTED NOT DETECTED Final   Neisseria meningitidis NOT DETECTED NOT DETECTED Final   Pseudomonas aeruginosa NOT DETECTED NOT DETECTED Final   Stenotrophomonas maltophilia NOT DETECTED NOT DETECTED Final   Candida albicans NOT DETECTED NOT DETECTED Final   Candida auris NOT DETECTED NOT DETECTED Final   Candida glabrata NOT DETECTED NOT DETECTED Final   Candida krusei NOT DETECTED NOT DETECTED Final   Candida parapsilosis NOT DETECTED NOT DETECTED Final   Candida tropicalis NOT DETECTED NOT DETECTED Final   Cryptococcus neoformans/gattii NOT DETECTED NOT DETECTED Final   CTX-M ESBL NOT DETECTED NOT DETECTED Final   Carbapenem resistance IMP NOT DETECTED NOT DETECTED Final   Carbapenem resistance KPC NOT DETECTED NOT DETECTED Final   Carbapenem resistance NDM NOT DETECTED NOT DETECTED Final   Carbapenem resist OXA 48 LIKE NOT DETECTED NOT DETECTED Final   Carbapenem resistance VIM NOT DETECTED NOT DETECTED Final    Comment: Performed at [facility], [address] [place], [place] [address]  Culture, blood (single)     Status: None (Preliminary result)   Collection Time: 11/11/20  9:31 PM   Specimen: BLOOD RIGHT HAND  Result Value Ref Range Status   Specimen Description   Final    BLOOD RIGHT HAND Performed at [place] [address], [place], [place] [address]    Special Requests   Final    BOTTLES DRAWN AEROBIC AND ANAEROBIC  Blood Culture adequate volume Performed at [place] [address], [place], [place] [address]    Culture  Setup Time   Final    GRAM NEGATIVE RODS IN BOTH AEROBIC AND ANAEROBIC BOTTLES CRITICAL VALUE NOTED.  VALUE IS CONSISTENT WITH PREVIOUSLY REPORTED AND CALLED VALUE. Performed at [facility], [place] [address], [place], [place] [address]    Culture GRAM NEGATIVE RODS  Final   Report Status PENDING  Incomplete         Radiology Studies: DG Chest 2 View  Result Date: 11/11/2020 CLINICAL DATA:  Cough and fever.  Nausea. EXAM: CHEST - 2 VIEW COMPARISON:  Radiograph 03/10/2015 FINDINGS: The cardiomediastinal contours are normal. Minimal chronic biapical pleuroparenchymal scarring. Pulmonary vasculature is normal. No consolidation, pleural effusion, or pneumothorax. No acute osseous abnormalities are seen. IMPRESSION: No acute chest findings. Electronically Signed   By: Keith Rake M.D.   On: 11/11/2020 17:39   CT Abdomen Pelvis W Contrast  Result Date: 11/11/2020 CLINICAL DATA:  Right lower quadrant pain EXAM: CT ABDOMEN AND PELVIS WITH CONTRAST TECHNIQUE: Multidetector CT imaging of the abdomen and pelvis was performed using the standard protocol following bolus administration of intravenous contrast. CONTRAST:  146mL OMNIPAQUE IOHEXOL 300 MG/ML  SOLN COMPARISON:  CT 04/24/2006 FINDINGS: Lower chest: Lung bases demonstrate no acute consolidation or effusion. Normal cardiac size Hepatobiliary: No focal liver abnormality is seen. No gallstones, gallbladder wall thickening, or biliary dilatation. Pancreas: Unremarkable. No pancreatic ductal dilatation or surrounding inflammatory changes. Spleen: Choose Adrenals/Urinary Tract: Adrenal glands are normal. Kidneys show no hydronephrosis. Urothelial enhancement of right renal pelvis  and diffusely involving the right ureter. No obstructing stone. The bladder is normal Stomach/Bowel: Stomach is within normal limits. Appendix appears normal. No evidence of bowel wall thickening, distention, or inflammatory changes. Sigmoid colon diverticula without acute inflammatory process. Vascular/Lymphatic: Mild aortic atherosclerosis. No aneurysm. No suspicious nodes. Reproductive: Uterus and bilateral adnexa are unremarkable. Other: Negative for free air or free fluid. Musculoskeletal: No acute or significant osseous findings. IMPRESSION: 1. Negative for acute appendicitis. 2. Urothelial enhancement of the right renal pelvis and diffusely involving the right ureter, suspicious for ascending urinary tract infection. Recommend correlation with urinalysis. 3. Sigmoid colon diverticular disease without acute inflammatory process. Aortic Atherosclerosis (ICD10-I70.0). Electronically Signed   By: Donavan Foil M.D.   On: 11/11/2020 22:58        Scheduled Meds: . amLODipine  10 mg Oral Daily  . aspirin EC  81 mg Oral Daily  . enoxaparin (LOVENOX) injection  40 mg Subcutaneous Q24H  . FLUoxetine  10 mg Oral Daily  . insulin aspart  0-15 Units Subcutaneous TID AC & HS  . [START ON 11/14/2020] ipratropium-albuterol  3 mL Nebulization BID  . lisinopril  40 mg Oral Daily  . mometasone-formoterol  2 puff Inhalation BID  . nicotine  21 mg Transdermal Daily  . pantoprazole  40 mg Oral Daily  . simvastatin  20 mg Oral q1800   Continuous Infusions: . sodium chloride 75 mL/hr at 11/13/20 1420  . cefTRIAXone (ROCEPHIN)  IV 2 g (11/13/20 1422)     LOS: 1 day    Time spent: 40 minutes    Irine Seal, MD [facility]   To contact the attending provider between 7A-7P or the covering provider during after hours 7P-7A, please log into the web site [email or web address] and access using universal [place] password for that web site. If you do not have the password, please call the  hospital  operator.  11/13/2020, 4:32 PM

## 2020-11-14 DIAGNOSIS — E119 Type 2 diabetes mellitus without complications: Secondary | ICD-10-CM

## 2020-11-14 LAB — CULTURE, BLOOD (SINGLE): Special Requests: ADEQUATE

## 2020-11-14 LAB — CBC WITH DIFFERENTIAL/PLATELET
Abs Immature Granulocytes: 0.03 10*3/uL (ref 0.00–0.07)
Basophils Absolute: 0.1 10*3/uL (ref 0.0–0.1)
Basophils Relative: 1 %
Eosinophils Absolute: 0.1 10*3/uL (ref 0.0–0.5)
Eosinophils Relative: 1 %
HCT: 40.4 % (ref 36.0–46.0)
Hemoglobin: 13 g/dL (ref 12.0–15.0)
Immature Granulocytes: 0 %
Lymphocytes Relative: 24 %
Lymphs Abs: 2.1 10*3/uL (ref 0.7–4.0)
MCH: 27.8 pg (ref 26.0–34.0)
MCHC: 32.2 g/dL (ref 30.0–36.0)
MCV: 86.3 fL (ref 80.0–100.0)
Monocytes Absolute: 0.7 10*3/uL (ref 0.1–1.0)
Monocytes Relative: 8 %
Neutro Abs: 5.8 10*3/uL (ref 1.7–7.7)
Neutrophils Relative %: 66 %
Platelets: 252 10*3/uL (ref 150–400)
RBC: 4.68 MIL/uL (ref 3.87–5.11)
RDW: 13.3 % (ref 11.5–15.5)
WBC: 8.8 10*3/uL (ref 4.0–10.5)
nRBC: 0 % (ref 0.0–0.2)

## 2020-11-14 LAB — GLUCOSE, CAPILLARY
Glucose-Capillary: 125 mg/dL — ABNORMAL HIGH (ref 70–99)
Glucose-Capillary: 143 mg/dL — ABNORMAL HIGH (ref 70–99)

## 2020-11-14 LAB — BASIC METABOLIC PANEL
Anion gap: 12 (ref 5–15)
BUN: 13 mg/dL (ref 8–23)
CO2: 23 mmol/L (ref 22–32)
Calcium: 9.2 mg/dL (ref 8.9–10.3)
Chloride: 100 mmol/L (ref 98–111)
Creatinine, Ser: 0.57 mg/dL (ref 0.44–1.00)
GFR, Estimated: >60 mL/min (ref >60)
Glucose, Bld: 148 mg/dL — ABNORMAL HIGH (ref 70–99)
Potassium: 4 mmol/L (ref 3.5–5.1)
Sodium: 135 mmol/L (ref 135–145)

## 2020-11-14 MED ORDER — CEPHALEXIN 500 MG PO CAPS: 500.0000 mg | ORAL_CAPSULE | Freq: Four times a day (QID) | ORAL | 0 refills | Status: AC

## 2020-11-14 MED ORDER — ONDANSETRON HCL 4 MG PO TABS: 4.0000 mg | ORAL_TABLET | Freq: Three times a day (TID) | ORAL | 0 refills | Status: AC | PRN

## 2020-11-14 MED ORDER — ALBUTEROL SULFATE HFA 108 (90 BASE) MCG/ACT IN AERS: 2.0000 | INHALATION_SPRAY | Freq: Four times a day (QID) | RESPIRATORY_TRACT | 2 refills | Status: DC | PRN

## 2020-11-14 MED ORDER — BUDESONIDE-FORMOTEROL FUMARATE 80-4.5 MCG/ACT IN AERO: 2.0000 | INHALATION_SPRAY | Freq: Two times a day (BID) | RESPIRATORY_TRACT | 2 refills | Status: DC

## 2020-11-14 MED ORDER — NICOTINE 21 MG/24HR TD PT24: 21.0000 mg | MEDICATED_PATCH | Freq: Every day | TRANSDERMAL | 0 refills | Status: DC

## 2020-11-14 NOTE — Discharge Instructions (Signed)
Infection Prevention in the Home If you have an infection, may have been exposed to an infection, or are taking care of someone who has an infection, it is important to know how to keep the infection from spreading. Follow your health care provider's instructions and use these guidelines to help stop the spread of infection. How infections are spread In order for an infection to spread, the following must be present:  A germ. This may be a virus, bacteria, fungus, or parasite.  A place for the germ to live. This may be: ? On or in a person, animal, plant, or food. ? In soil or water. ? On surfaces, such as a door handle.  A person or animal who can develop a disease if the germ enters the body (host). The host does not have resistance to the germ.  A way for the germ to enter the host. This may occur by: ? Direct contact with an infected person or animal. This can happen through shaking hands or hugging. Some germs can also travel through the air and spread to others. This can happen when an infected person coughs or sneezes on or near other people. ? Indirect contact. This occurs when the germ enters the host through contact with an infected object. Examples include:  Eating or drinking food or water that has the germ (is contaminated).  Touching a contaminated surface with your hands, and then touching your face, eyes, nose, or mouth. Supplies needed:  Soap.  Alcohol-based hand sanitizer.  Standard cleaning products.  Disinfectants, such as bleach.  Reusable cleaning cloths, sponges, or paper towels.  Disposable or reusable utility gloves. How to prevent infection from spreading There are several things that you can do to help prevent infection from spreading. Take these general actions Everyone should take the following actions to prevent the spread of infection:  Wash your hands often with soap and water for at least 20 seconds. If soap and water are not available, use  alcohol-based hand sanitizer.  Avoid touching your face, mouth, nose, or eyes.  Cough or sneeze into a tissue, sleeve, or elbow instead of into your hand or into the air. ? If you cough or sneeze into a tissue, throw it away immediately and wash your hands.  Keep your bathroom clean  Provide soap.  Change towels and washcloths frequently.  Change toothbrushes often and store them separately in a clean, dry place.  Clean and disinfect all surfaces, including the toilet, floor, tub, shower, and sink.  Do not share personal items, such as razors, toothbrushes, deodorant, combs, brushes, towels, and washcloths. Maintain hygiene in the [facility] your hands before and after preparing food and before you eat.  Clean the inside of your refrigerator each week.  Keep your refrigerator set at 23F (4C) or less, and set your freezer at 50F (-18C) or less.  Keep work surfaces clean. Disinfect them regularly.  Wash your dishes in hot, soapy water. Air-dry your dishes or use a dishwasher.  Do not share dishes or eating utensils. Handle food safely  Store food carefully.  Refrigerate leftovers promptly in covered containers.  Throw out stale or spoiled food.  Thaw foods in the refrigerator or microwave, not at room temperature.  Serve foods at the proper temperature. Do not eat raw meat. Make sure it is cooked to the appropriate temperature. Cook eggs until they are firm.  Wash fruits and vegetables under running water.  Use separate cutting boards, plates, and utensils  for raw foods and cooked foods.  Use a clean spoon each time you sample food while cooking. Do laundry the right way  Wear gloves if laundry is visibly soiled.  Do not shake soiled laundry. Doing that may send germs into the air.  Wash laundry in hot water.  If you cannot wash the laundry right away, place it in a plastic bag and wash it as soon as possible. Be careful around animals and pets  Wash  your hands before and after touching animals.  If you have a pet, ensure that your pet stays clean. Do not let people with weak immune systems touch bird droppings, fish tank water, or a litter box. ? If you have a pet cage or litter box, be sure to clean it every day.  If you are sick, stay away from animals and have someone else care for them if possible. How to clean and disinfect objects and surfaces Precautions  Some disinfectants work for certain germs and not others. Read the manufacturer's instructions or read online resources to determine if the product you are using will work for the germ you are trying to remove.  If you choose to use bleach, use it safely. Never mix it with other cleaning products, especially those that contain ammonia. This mixture can create a dangerous gas that may be deadly.  Keep proper movement of fresh air in your home (ventilation).  Pour used mop water down the utility sink or toilet. Do not pour this water down the kitchen sink. Objects and surfaces   If surfaces are visibly soiled, clean them first with soap and water before disinfecting.  Disinfect surfaces that are frequently touched every day. This may include: ? Counters. ? Tables. ? Doorknobs. ? Sinks and faucets. ? Electronics, such as:  Architectural technologist.  Remote controls.  Keyboards.  Computers and tablets. Cleaning supplies Some cleaning supplies can breed germs. Take good care of them to prevent germs from spreading. To do this:  Soak toilet brushes, mops, and sponges in bleach and water for 5 minutes after each use, or according to manufacturer's instructions.  Wash reusable cleaning cloths and sanitize sponges after each use.  Throw away disposable gloves after one use.  Replace reusable utility gloves if they are cracked or torn or if they start to peel. Additional actions if you are sick If you live with other people:   Avoid close contact with those around you. Stay at least  3 ft (1 m) away from others, if possible.  Use a separate bathroom, if possible.  If possible, sleep in a separate bedroom or in a separate bed to prevent infecting other household members. ? Change bedroom linens each week or whenever they are soiled.  Have everyone in the household wash hands often with soap and water. If soap and water are not available, use alcohol-based hand sanitizer. In general:  Stay home except to get medical care. Call ahead before visiting your health care provider.  Ask others to get groceries and household supplies and to refill prescriptions for you.  Avoid public areas. Try not to take public transportation.  If you can, wear a mask if you need to go out of the house, or if you are in close contact with someone who is not sick.  Avoid visitors until you have completely recovered, or until you have no signs and symptoms of infection.  Avoid preparing food or providing care for others. If you must prepare food or provide care  for others, wear a mask and wash your hands before and after doing these things. Where to find more information  Centers for Disease Control and Prevention: PromotionalReview.nl  World Health Organization [facility]): PrintStats.tn  Association for Professionals in Infection Control and Epidemiology: professionals.site.StadiumBlog.se Summary  It is important to know how to keep the infection from spreading.  Make sure everyone in your household washes their hands often with soap and water.  Disinfect surfaces that are frequently touched every day.  If you are sick, stay home except to get medical care. This information is not intended to replace advice given to you by your health care provider. Make sure you discuss any questions you have with your health care provider. Document Released: 08/22/2008 Document Revised: 03/11/2019  Document Reviewed: 02/07/2019 Elsevier Patient Education  [place].   Urinary Tract Infection, Adult A urinary tract infection (UTI) is an infection of any part of the urinary tract. The urinary tract includes:  The kidneys.  The ureters.  The bladder.  The urethra. These organs make, store, and get rid of pee (urine) in the body. What are the causes? This is caused by germs (bacteria) in your genital area. These germs grow and cause swelling (inflammation) of your urinary tract. What increases the risk? You are more likely to develop this condition if:  You have a small, thin tube (catheter) to drain pee.  You cannot control when you pee or poop (incontinence).  You are female, and: ? You use these methods to prevent pregnancy:  A medicine that kills sperm (spermicide).  A device that blocks sperm (diaphragm). ? You have low levels of a female hormone (estrogen). ? You are pregnant.  You have genes that add to your risk.  You are sexually active.  You take antibiotic medicines.  You have trouble peeing because of: ? A prostate that is bigger than normal, if you are female. ? A blockage in the part of your body that drains pee from the bladder (urethra). ? A kidney stone. ? A nerve condition that affects your bladder (neurogenic bladder). ? Not getting enough to drink. ? Not peeing often enough.  You have other conditions, such as: ? Diabetes. ? A weak disease-fighting system (immune system). ? Sickle cell disease. ? Gout. ? Injury of the spine. What are the signs or symptoms? Symptoms of this condition include:  Needing to pee right away (urgently).  Peeing often.  Peeing small amounts often.  Pain or burning when peeing.  Blood in the pee.  Pee that smells bad or not like normal.  Trouble peeing.  Pee that is cloudy.  Fluid coming from the vagina, if you are female.  Pain in the belly or lower back. Other symptoms  include:  Throwing up (vomiting).  No urge to eat.  Feeling mixed up (confused).  Being tired and grouchy (irritable).  A fever.  Watery poop (diarrhea). How is this treated? This condition may be treated with:  Antibiotic medicine.  Other medicines.  Drinking enough water. Follow these instructions at home:  Medicines  Take over-the-counter and prescription medicines only as told by your doctor.  If you were prescribed an antibiotic medicine, take it as told by your doctor. Do not stop taking it even if you start to feel better. General instructions  Make sure you: ? Pee until your bladder is empty. ? Do not hold pee for a long time. ? Empty your bladder after sex. ? Wipe from front to back after  pooping if you are a female. Use each tissue one time when you wipe.  Drink enough fluid to keep your pee pale yellow.  Keep all follow-up visits as told by your doctor. This is important. Contact a doctor if:  You do not get better after 1-2 days.  Your symptoms go away and then come back. Get help right away if:  You have very bad back pain.  You have very bad pain in your lower belly.  You have a fever.  You are sick to your stomach (nauseous).  You are throwing up. Summary  A urinary tract infection (UTI) is an infection of any part of the urinary tract.  This condition is caused by germs in your genital area.  There are many risk factors for a UTI. These include having a small, thin tube to drain pee and not being able to control when you pee or poop.  Treatment includes antibiotic medicines for germs.  Drink enough fluid to keep your pee pale yellow. This information is not intended to replace advice given to you by your health care provider. Make sure you discuss any questions you have with your health care provider. Document Revised: 10/31/2018 Document Reviewed: 05/23/2018 Elsevier Patient Education  2020 [place],  Adult  Urosepsis is a type of sepsis. Sepsis is a severe bodily reaction to an infection. Urosepsis is caused by a bacterial infection that starts in the urinary tract and spreads to the blood. The urinary tract is the system where urine is made, stored, and passed out of the body and includes the kidneys, ureters, bladder, and urethra. This may also be called the urinary system. In severe cases, sepsis can lead to septic shock. Septic shock can weaken your heart and cause your blood pressure to drop. This can make the body's central nervous system and other vital organs stop working. Urosepsis is a medical emergency that requires immediate treatment in a hospital. What are the causes? Common causes of this condition include:  A urinary tract infection (UTI) that spreads to your blood.  A urinary tract blockage due to kidney stones.  Swelling and inflammation of the prostate (prostatitis) or prostate infection, in males. What increases the risk? You are more likely to develop this condition if you:  Are female, especially if you are sexually active.  Are age 5 or older.  Have a long-term disease, such as kidney disease or diabetes.  Have a weak disease-fighting system (immune system).  Have a condition that lessens or changes urine flow, such as a kidney or bladder stone, prostate disease, or a tumor in the urinary tract.  Have had surgery in an area of the urinary tract.  Have a small, thin tube in your urethra that drains urine from your bladder for a period of time (indwelling urinary catheter).  Have lost feeling below the waist or are in a wheelchair. What are the signs or symptoms? Early symptoms of this condition are similar to symptoms of a severe UTI. Common symptoms of this condition include:  Pain in your side, back, or lower abdomen.  Fever and chills.  Nausea and vomiting.  Frequent need to pass urine.  Burning pain when passing urine.  Bloody or cloudy  urine.  Bad-smelling urine.  Fatigue.  Trouble passing urine or not being able to pass urine at all. Once the infection has spread to the blood and a sepsis reaction starts, other symptoms may include:  Chills with shaking.  Cold  and clammy skin.  A fever of 101.11F (38.5C) or higher.  Low body temperature of 96.44F (36C) or lower.  Fast breathing.  Trouble breathing.  Fast heartbeat.  Severe pain in the abdomen.  Muscle aches.  Anxiety.  Problems staying awake.  Confusion.  Fainting. How is this diagnosed? This condition is diagnosed based on your symptoms, your medical history, and a physical exam. You may also have:  Urine tests or blood tests to check kidney function and to look for infection.  Imaging tests such as a CT scan or ultrasound to check for blockages in the urinary system. How is this treated? This condition is a medical emergency that needs to be treated right away in the hospital. This condition may be treated with:  An IV so that you can quickly receive: ? Antibiotic medicines. ? Fluids. ? Medicines to support blood pressure.  Oxygen and breathing support, if needed.  Removing a urinary catheter if it is the source of the infection, if this applies.  Filtering your blood with a machine (dialysis). This process cleans your blood if your kidneys have failed.  Surgery to drain infected areas or restore urine flow. This is rare. Follow these instructions at home: Medicines  Take over-the-counter and prescription medicines only as told by your health care provider.  If you were prescribed an antibiotic medicine, take it as told by your health care provider. Do not stop using the antibiotic even if you start to feel better. General instructions   Drink enough fluid to keep your urine pale yellow.  Return to your normal activities as told by your health care provider. Ask your health care provider what activities are safe for  you.  Keep all follow-up visits as told by your health care provider. This is important. Contact a health care provider if:  You have symptoms that get worse or do not get better with treatment.  You have new UTI symptoms. Get help right away if:  You have new or continued symptoms of sepsis after hospitalization, such as: ? A fever of 101.11F (38.5C) or higher. ? Low body temperature of 96.44F (36C) or lower. ? Chills. ? Severe pain. ? Difficulty breathing. ? Confusion. ? Sleepiness. ? Nausea and vomiting. These symptoms may represent a serious problem that is an emergency. Do not wait to see if the symptoms will go away. Get medical help right away. Call your local emergency services (911 in the [place] Do not drive yourself to the hospital. Summary  Urosepsis is a type of sepsis. Sepsis is a severe bodily reaction to an infection.  Urosepsis is a medical emergency that requires immediate treatment in a hospital.  Possible causes of urosepsis include a urinary tract infection that spreads to your blood, blockage from kidney stones, and prostate swelling or infection in males.  This condition may be treated with an IV so that you can quickly receive antibiotic medicines, fluids, and medicines to support your blood pressure. Other treatments may be used as well.  Get help right away if you have new or continued symptoms of sepsis after hospitalization. This information is not intended to replace advice given to you by your health care provider. Make sure you discuss any questions you have with your health care provider. Document Revised: 12/11/2018 Document Reviewed: 12/12/2018 Elsevier Patient Education  [place].

## 2020-11-14 NOTE — Progress Notes (Signed)
Patient and family provided with the discharge paper work and instructions. All questions and concerns answered. Patient verbalized understanding and denied any further concerns.

## 2020-11-14 NOTE — Plan of Care (Signed)
Problem: Education: Goal: Ability to describe self-care measures that may prevent or decrease complications (Diabetes Survival Skills Education) will improve 11/14/2020 1528 by Dorene Sorrow, RN Outcome: Adequate for Discharge 11/14/2020 1142 by Dorene Sorrow, RN Outcome: Progressing Goal: Individualized Educational Video(s) 11/14/2020 1528 by Dorene Sorrow, RN Outcome: Adequate for Discharge 11/14/2020 1142 by Dorene Sorrow, RN Outcome: Progressing   Problem: Coping: Goal: Ability to adjust to condition or change in health will improve 11/14/2020 1528 by Dorene Sorrow, RN Outcome: Adequate for Discharge 11/14/2020 1142 by Dorene Sorrow, RN Outcome: Progressing   Problem: Fluid Volume: Goal: Ability to maintain a balanced intake and output will improve 11/14/2020 1528 by Dorene Sorrow, RN Outcome: Adequate for Discharge 11/14/2020 1142 by Dorene Sorrow, RN Outcome: Progressing   Problem: Health Behavior/Discharge Planning: Goal: Ability to identify and utilize available resources and services will improve 11/14/2020 1528 by Dorene Sorrow, RN Outcome: Adequate for Discharge 11/14/2020 1142 by Dorene Sorrow, RN Outcome: Progressing Goal: Ability to manage health-related needs will improve 11/14/2020 1528 by Dorene Sorrow, RN Outcome: Adequate for Discharge 11/14/2020 1142 by Dorene Sorrow, RN Outcome: Progressing   Problem: Metabolic: Goal: Ability to maintain appropriate glucose levels will improve 11/14/2020 1528 by Dorene Sorrow, RN Outcome: Adequate for Discharge 11/14/2020 1142 by Dorene Sorrow, RN Outcome: Progressing   Problem: Nutritional: Goal: Maintenance of adequate nutrition will improve 11/14/2020 1528 by Dorene Sorrow, RN Outcome: Adequate for Discharge 11/14/2020 1142 by Dorene Sorrow, RN Outcome: Progressing Goal: Progress toward achieving an optimal weight will improve 11/14/2020 1528 by Dorene Sorrow, RN Outcome: Adequate for Discharge 11/14/2020 1142 by Dorene Sorrow, RN Outcome: Progressing   Problem: Skin Integrity: Goal: Risk for impaired skin integrity will decrease 11/14/2020 1528 by Dorene Sorrow, RN Outcome: Adequate for Discharge 11/14/2020 1142 by Dorene Sorrow, RN Outcome: Progressing   Problem: Tissue Perfusion: Goal: Adequacy of tissue perfusion will improve 11/14/2020 1528 by Dorene Sorrow, RN Outcome: Adequate for Discharge 11/14/2020 1142 by Macario Carls D, RN Outcome: Progressing   Problem: Fluid Volume: Goal: Hemodynamic stability will improve 11/14/2020 1528 by Dorene Sorrow, RN Outcome: Adequate for Discharge 11/14/2020 1142 by Dorene Sorrow, RN Outcome: Progressing   Problem: Clinical Measurements: Goal: Diagnostic test results will improve 11/14/2020 1528 by Dorene Sorrow, RN Outcome: Adequate for Discharge 11/14/2020 1142 by Dorene Sorrow, RN Outcome: Progressing Goal: Signs and symptoms of infection will decrease 11/14/2020 1528 by Dorene Sorrow, RN Outcome: Adequate for Discharge 11/14/2020 1142 by Dorene Sorrow, RN Outcome: Progressing   Problem: Respiratory: Goal: Ability to maintain adequate ventilation will improve 11/14/2020 1528 by Dorene Sorrow, RN Outcome: Adequate for Discharge 11/14/2020 1142 by Dorene Sorrow, RN Outcome: Progressing   Problem: Education: Goal: Knowledge of General Education information will improve Description: Including pain rating scale, medication(s)/side effects and non-pharmacologic comfort measures 11/14/2020 1528 by Dorene Sorrow, RN Outcome: Adequate for Discharge 11/14/2020 1142 by Dorene Sorrow, RN Outcome: Progressing   Problem: Health Behavior/Discharge Planning: Goal: Ability to manage health-related needs will improve 11/14/2020 1528 by Dorene Sorrow, RN Outcome: Adequate for Discharge 11/14/2020 1142 by Dorene Sorrow, RN Outcome:  Progressing   Problem: Clinical Measurements: Goal: Ability to maintain clinical measurements within normal limits will improve 11/14/2020 1528 by Dorene Sorrow, RN Outcome: Adequate for Discharge 11/14/2020 1142 by Dorene Sorrow, RN Outcome: Progressing Goal: Will remain free  from infection 11/14/2020 1528 by Dorene Sorrow, RN Outcome: Adequate for Discharge 11/14/2020 1142 by Dorene Sorrow, RN Outcome: Progressing Goal: Diagnostic test results will improve 11/14/2020 1528 by Dorene Sorrow, RN Outcome: Adequate for Discharge 11/14/2020 1142 by Dorene Sorrow, RN Outcome: Progressing Goal: Respiratory complications will improve 11/14/2020 1528 by Dorene Sorrow, RN Outcome: Adequate for Discharge 11/14/2020 1142 by Dorene Sorrow, RN Outcome: Progressing Goal: Cardiovascular complication will be avoided 11/14/2020 1528 by Dorene Sorrow, RN Outcome: Adequate for Discharge 11/14/2020 1142 by Dorene Sorrow, RN Outcome: Progressing   Problem: Activity: Goal: Risk for activity intolerance will decrease 11/14/2020 1528 by Dorene Sorrow, RN Outcome: Adequate for Discharge 11/14/2020 1142 by Dorene Sorrow, RN Outcome: Progressing   Problem: Nutrition: Goal: Adequate nutrition will be maintained 11/14/2020 1528 by Dorene Sorrow, RN Outcome: Adequate for Discharge 11/14/2020 1142 by Dorene Sorrow, RN Outcome: Progressing   Problem: Coping: Goal: Level of anxiety will decrease 11/14/2020 1528 by Dorene Sorrow, RN Outcome: Adequate for Discharge 11/14/2020 1142 by Dorene Sorrow, RN Outcome: Progressing   Problem: Elimination: Goal: Will not experience complications related to bowel motility 11/14/2020 1528 by Dorene Sorrow, RN Outcome: Adequate for Discharge 11/14/2020 1142 by Dorene Sorrow, RN Outcome: Progressing Goal: Will not experience complications related to urinary retention 11/14/2020 1528 by Dorene Sorrow,  RN Outcome: Adequate for Discharge 11/14/2020 1142 by Dorene Sorrow, RN Outcome: Progressing   Problem: Pain Managment: Goal: General experience of comfort will improve 11/14/2020 1528 by Dorene Sorrow, RN Outcome: Adequate for Discharge 11/14/2020 1142 by Dorene Sorrow, RN Outcome: Progressing   Problem: Safety: Goal: Ability to remain free from injury will improve 11/14/2020 1528 by Dorene Sorrow, RN Outcome: Adequate for Discharge 11/14/2020 1142 by Dorene Sorrow, RN Outcome: Progressing   Problem: Skin Integrity: Goal: Risk for impaired skin integrity will decrease 11/14/2020 1528 by Dorene Sorrow, RN Outcome: Adequate for Discharge 11/14/2020 1142 by Dorene Sorrow, RN Outcome: Progressing   Problem: Urinary Elimination: Goal: Signs and symptoms of infection will decrease 11/14/2020 1528 by Dorene Sorrow, RN Outcome: Adequate for Discharge 11/14/2020 1142 by Macario Carls D, RN Outcome: Progressing   Problem: Fluid Volume: Goal: Hemodynamic stability will improve 11/14/2020 1528 by Dorene Sorrow, RN Outcome: Adequate for Discharge 11/14/2020 1142 by Dorene Sorrow, RN Outcome: Progressing   Problem: Clinical Measurements: Goal: Diagnostic test results will improve 11/14/2020 1528 by Dorene Sorrow, RN Outcome: Adequate for Discharge 11/14/2020 1142 by Dorene Sorrow, RN Outcome: Progressing Goal: Signs and symptoms of infection will decrease 11/14/2020 1528 by Dorene Sorrow, RN Outcome: Adequate for Discharge 11/14/2020 1142 by Dorene Sorrow, RN Outcome: Progressing   Problem: Respiratory: Goal: Ability to maintain adequate ventilation will improve 11/14/2020 1528 by Dorene Sorrow, RN Outcome: Adequate for Discharge 11/14/2020 1142 by Dorene Sorrow, RN Outcome: Progressing

## 2020-11-14 NOTE — Discharge Summary (Signed)
Physician Discharge Summary  Shelly Clark CBU:[id number] DOB: 10-Jan-1946 DOA: 11/11/2020  PCP: [name], NP  Admit date: 11/11/2020 Discharge date: 11/14/2020  Time spent: 50 minutes  Recommendations for Outpatient Follow-up:  1. Follow-up with [name], NP in 2 weeks.  On follow-up patient will need a basic metabolic profile done to follow-up on electrolytes and renal function.  If patient noted to be hyponatremic may consider discontinuation of HCTZ.   Discharge Diagnoses:  Principal Problem:   Sepsis due to gram-negative UTI ([place]) Active Problems:   E coli bacteremia   Essential hypertension   Dehydration with hyponatremia   Mixed hyperlipidemia due to type 2 diabetes mellitus (HCC)   GERD without esophagitis   Nicotine dependence, cigarettes, uncomplicated   Hypokalemia, inadequate intake   E. coli UTI   Hypokalemia   Wheezing on expiration   Discharge Condition: Stable and improved  Diet recommendation: Carb modified diet  Filed Weights   11/12/20 0112 11/13/20 0500 11/14/20 0500  Weight: 56.2 kg 57.9 kg 57.9 kg    History of present illness:  HPI per Dr. Cyd Silence 74 year old female with past medical history of diabetes mellitus type 2, gastroesophageal reflux disease, hypertension, hyperlipidemia with a long hospital emergency department with complaints of abdominal pain and fever.  Patient explains that for approximately the past month she has been experiencing intermittent lower abdominal pain.  Initially this pain was mild in intensity but in the past several days in particular, pain has become more severe.  Pain is sharp in quality, mostly located in the right lower quadrant and nonradiating.  Patient is complaining of associated generalized weakness and dysuria.  Patient is also complaining of poor appetite that has been worsening over the span of time.  As of late, patient has been also began to develop fevers and reports a fever of 104.5 F the  evening of 12/16.    Due to worsening weakness, abdominal pain and fevers patient eventually presented to [facility] emergency department for evaluation.  Upon evaluation in the emergency department, patient been found to have substantial leukocytosis as well as intermittent tachypnea and tachycardia with fevers as mentioned above concerning for early sepsis.  Patient was initiated on intravenous fluids as well as intravenous ceftriaxone.  CT imaging of the abdomen pelvis was also performed revealing urothelial enhancement of the right renal pelvis and right ureter concerning for ascending urinary tract infection.  The hospitalist group was then called to assess the patient for admission to the hospital.  Hospital Course:  1 sepsis secondary to E. coli bacteremia and E. coli UTI Patient presented with criteria for sepsis with leukocytosis, fever, tachycardia, tachypnea, CT abdomen and pelvis done concerning for ascending UTI.  Patient pancultured with blood cultures positive for E. coli, urine cultures positive for E. coli.  Patient was placed empirically on IV Rocephin and dose adjusted to 2 g daily.  Patient also hydrated with IV fluids.  Patient remained afebrile.  Patient improved clinically.  Leukocytosis trended down and had resolved by day of discharge.  Patient be discharged home on 5 more days of oral Keflex to complete a 8-day course of antibiotic treatment.  Outpatient follow-up with PCP.   2.  Dehydration with hyponatremia Hyponatremia secondary to volume depletion and dehydration.  Hyponatremia improved with hydration.  Patient's HCTZ was held during the hospitalization.  Patient hydrated with IV fluids.  Outpatient follow-up with PCP.  If patient with continued hyponatremia on follow-up will recommend discontinuation of patient's HCTZ.  3.  Hypertension Stable.  Patient maintained on home regimen of lisinopril and Norvasc.  HCTZ was held during the hospitalization will be  resumed on discharge.  Outpatient follow-up.   4.  Expiratory wheezing Patient with some minimal expiratory wheezing noted during the hospitalization..  Patient with an extensive tobacco history.  Tobacco cessation stressed to patient.  Placed on Dulera, scheduled duo nebs with significant clinical improvement.  Patient was discharged home on Symbicort as well as albuterol MDI as needed.  5.  Tobacco abuse Tobacco cessation stressed to patient.    Patient placed on a nicotine patch.    6.  Hypokalemia Repleted.  7.  Gastroesophageal reflux disease Patient was maintained on a PPI.  8.  Hyperlipidemia Patient maintained on home regimen statin.    9.  Well-controlled type 2 diabetes mellitus Hemoglobin A1c 6.1 (11/12/2020).  Patient's oral hypoglycemic agents were held throughout the hospitalization.  Patient maintained on sliding scale insulin.  Outpatient follow-up with PCP.    Procedures:  CT abdomen and pelvis 11/11/2020  Chest x-ray 11/11/2020    Consultations:  None  Discharge Exam: Vitals:   11/14/20 0525 11/14/20 1028  BP: (!) 150/76   Pulse: 72   Resp: 20   Temp: 98 F (36.7 C)   SpO2: 98% 96%    General: NAD Cardiovascular: RRR Respiratory: CTAB  Discharge Instructions   Discharge Instructions    Diet Carb Modified   Complete by: As directed    Increase activity slowly   Complete by: As directed      Allergies as of 11/14/2020      Reactions   Codeine Nausea And Vomiting      Medication List    STOP taking these medications   ciprofloxacin 500 MG tablet Commonly known as: Cipro     TAKE these medications   albuterol 108 (90 Base) MCG/ACT inhaler Commonly known as: VENTOLIN HFA Inhale 2 puffs into the lungs every 6 (six) hours as needed for wheezing or shortness of breath.   amLODipine 10 MG tablet Commonly known as: NORVASC TAKE 1 TABLET(10 MG) BY MOUTH DAILY   aspirin EC 81 MG tablet Take 81 mg by mouth daily.    B-complex with vitamin C tablet Take 1 tablet by mouth daily.   budesonide-formoterol 80-4.5 MCG/ACT inhaler Commonly known as: Symbicort Inhale 2 puffs into the lungs in the morning and at bedtime.   cephALEXin 500 MG capsule Commonly known as: KEFLEX Take 1 capsule (500 mg total) by mouth 4 (four) times daily for 5 days. Start taking on: November 15, 2020   clonazePAM 0.5 MG tablet Commonly known as: KLONOPIN Take 1 tablet (0.5 mg total) by mouth at bedtime as needed. for sleep   FLUoxetine 10 MG capsule Commonly known as: PROZAC TAKE 1 CAPSULE(10 MG) BY MOUTH DAILY What changed: See the new instructions.   glipiZIDE 5 MG 24 hr tablet Commonly known as: GLUCOTROL XL TAKE 1 TABLET(5 MG) BY MOUTH DAILY WITH BREAKFAST What changed: See the new instructions.   hydrochlorothiazide 12.5 MG capsule Commonly known as: MICROZIDE TAKE 1 CAPSULE(12.5 MG) BY MOUTH DAILY What changed: See the new instructions.   lisinopril 40 MG tablet Commonly known as: ZESTRIL TAKE 1 TABLET(40 MG) BY MOUTH DAILY What changed: See the new instructions.   metFORMIN 500 MG tablet Commonly known as: GLUCOPHAGE TAKE 1 TABLET BY MOUTH TWICE DAILY WITH A MEAL What changed: See the new instructions.   multivitamin with minerals Tabs tablet Take 1 tablet by  mouth daily.   nicotine 21 mg/24hr patch Commonly known as: NICODERM CQ - dosed in mg/24 hours Place 1 patch (21 mg total) onto the skin daily. Start taking on: November 15, 2020   ondansetron 4 MG tablet Commonly known as: Zofran Take 1 tablet (4 mg total) by mouth every 8 (eight) hours as needed for up to 3 days for nausea or vomiting.   pantoprazole 20 MG tablet Commonly known as: PROTONIX Take 1 tablet (20 mg total) by mouth daily.   simvastatin 20 MG tablet Commonly known as: ZOCOR TAKE 1 TABLET BY MOUTH EVERY DAY What changed: when to take this   triamcinolone ointment 0.5 % Commonly known as: KENALOG Apply 1 application  topically 2 (two) times daily.      Allergies  Allergen Reactions  . Codeine Nausea And Vomiting    Follow-up Information    [name], NP. Schedule an appointment as soon as possible for a visit in 2 week(s).   Specialty: Family Medicine Contact information: [address] [place] [address] [phone]                The results of significant diagnostics from this hospitalization (including imaging, microbiology, ancillary and laboratory) are listed below for reference.    Significant Diagnostic Studies: DG Chest 2 View  Result Date: 11/11/2020 CLINICAL DATA:  Cough and fever.  Nausea. EXAM: CHEST - 2 VIEW COMPARISON:  Radiograph 03/10/2015 FINDINGS: The cardiomediastinal contours are normal. Minimal chronic biapical pleuroparenchymal scarring. Pulmonary vasculature is normal. No consolidation, pleural effusion, or pneumothorax. No acute osseous abnormalities are seen. IMPRESSION: No acute chest findings. Electronically Signed   By: Keith Rake M.D.   On: 11/11/2020 17:39   CT Abdomen Pelvis W Contrast  Result Date: 11/11/2020 CLINICAL DATA:  Right lower quadrant pain EXAM: CT ABDOMEN AND PELVIS WITH CONTRAST TECHNIQUE: Multidetector CT imaging of the abdomen and pelvis was performed using the standard protocol following bolus administration of intravenous contrast. CONTRAST:  146mL OMNIPAQUE IOHEXOL 300 MG/ML  SOLN COMPARISON:  CT 04/24/2006 FINDINGS: Lower chest: Lung bases demonstrate no acute consolidation or effusion. Normal cardiac size Hepatobiliary: No focal liver abnormality is seen. No gallstones, gallbladder wall thickening, or biliary dilatation. Pancreas: Unremarkable. No pancreatic ductal dilatation or surrounding inflammatory changes. Spleen: Choose Adrenals/Urinary Tract: Adrenal glands are normal. Kidneys show no hydronephrosis. Urothelial enhancement of right renal pelvis and diffusely involving the right ureter. No obstructing stone. The  bladder is normal Stomach/Bowel: Stomach is within normal limits. Appendix appears normal. No evidence of bowel wall thickening, distention, or inflammatory changes. Sigmoid colon diverticula without acute inflammatory process. Vascular/Lymphatic: Mild aortic atherosclerosis. No aneurysm. No suspicious nodes. Reproductive: Uterus and bilateral adnexa are unremarkable. Other: Negative for free air or free fluid. Musculoskeletal: No acute or significant osseous findings. IMPRESSION: 1. Negative for acute appendicitis. 2. Urothelial enhancement of the right renal pelvis and diffusely involving the right ureter, suspicious for ascending urinary tract infection. Recommend correlation with urinalysis. 3. Sigmoid colon diverticular disease without acute inflammatory process. Aortic Atherosclerosis (ICD10-I70.0). Electronically Signed   By: Donavan Foil M.D.   On: 11/11/2020 22:58    Microbiology: Recent Results (from the past 240 hour(s))  Culture, blood (single)     Status: Abnormal   Collection Time: 11/11/20  7:23 PM   Specimen: BLOOD  Result Value Ref Range Status   Specimen Description   Final    BLOOD LEFT ANTECUBITAL Performed at [facility] [place], [place]  [address]    Special Requests   Final    BOTTLES DRAWN AEROBIC AND ANAEROBIC Blood Culture results may not be optimal due to an excessive volume of blood received in culture bottles Performed at [facility] [address] [place], [place] [address]    Culture  Setup Time   Final    GRAM NEGATIVE RODS ANAEROBIC BOTTLE ONLY Organism ID to follow CRITICAL RESULT CALLED TO, READ BACK BY AND VERIFIED WITH: Christean Grief PharmD 14:00 11/12/20 ([email or web address]) Performed at [facility], [address], [place], [place] [address]    Culture ESCHERICHIA COLI (A)  Final   Report Status 11/14/2020 FINAL  Final   Organism ID, Bacteria ESCHERICHIA COLI  Final      Susceptibility   Escherichia  coli - MIC*    AMPICILLIN >=32 RESISTANT Resistant     CEFAZOLIN <=4 SENSITIVE Sensitive     CEFEPIME <=0.12 SENSITIVE Sensitive     CEFTAZIDIME <=1 SENSITIVE Sensitive     CEFTRIAXONE <=0.25 SENSITIVE Sensitive     CIPROFLOXACIN <=0.25 SENSITIVE Sensitive     GENTAMICIN >=16 RESISTANT Resistant     IMIPENEM <=0.25 SENSITIVE Sensitive     TRIMETH/SULFA <=20 SENSITIVE Sensitive     AMPICILLIN/SULBACTAM 16 INTERMEDIATE Intermediate     PIP/TAZO <=4 SENSITIVE Sensitive     * ESCHERICHIA COLI  Resp Panel by RT-PCR (Flu A&B, Covid) Nasopharyngeal Swab     Status: None   Collection Time: 11/11/20  7:23 PM   Specimen: Nasopharyngeal Swab; Nasopharyngeal(NP) swabs in vial transport medium  Result Value Ref Range Status   SARS Coronavirus 2 by RT PCR NEGATIVE NEGATIVE Final    Comment: (NOTE) SARS-CoV-2 target nucleic acids are NOT DETECTED.  The SARS-CoV-2 RNA is generally detectable in upper respiratory specimens during the acute phase of infection. The lowest concentration of SARS-CoV-2 viral copies this assay can detect is 138 copies/mL. A negative result does not preclude SARS-Cov-2 infection and should not be used as the sole basis for treatment or other patient management decisions. A negative result may occur with  improper specimen collection/handling, submission of specimen other than nasopharyngeal swab, presence of viral mutation(s) within the areas targeted by this assay, and inadequate number of viral copies(<138 copies/mL). A negative result must be combined with clinical observations, patient history, and epidemiological information. The expected result is Negative.  Fact Sheet for Patients:  EntrepreneurPulse.com.au  Fact Sheet for Healthcare Providers:  IncredibleEmployment.be  This test is no t yet approved or cleared by the [place] FDA and  has been authorized for detection and/or diagnosis of SARS-CoV-2 by FDA under an  Emergency Use Authorization (EUA). This EUA will remain  in effect (meaning this test can be used) for the duration of the COVID-19 declaration under Section 564(b)(1) of the Act, 21 U.S.C.section 360bbb-3(b)(1), unless the authorization is terminated  or revoked sooner.       Influenza A by PCR NEGATIVE NEGATIVE Final   Influenza B by PCR NEGATIVE NEGATIVE Final    Comment: (NOTE) The Xpert Xpress SARS-CoV-2/FLU/RSV plus assay is intended as an aid in the diagnosis of influenza from Nasopharyngeal swab specimens and should not be used as a sole basis for treatment. Nasal washings and aspirates are unacceptable for Xpert Xpress SARS-CoV-2/FLU/RSV testing.  Fact Sheet for Patients: EntrepreneurPulse.com.au  Fact Sheet for Healthcare Providers: IncredibleEmployment.be  This test is not yet approved or cleared by the [place] FDA and has been authorized for detection and/or diagnosis of SARS-CoV-2  by FDA under an Emergency Use Authorization (EUA). This EUA will remain in effect (meaning this test can be used) for the duration of the COVID-19 declaration under Section 564(b)(1) of the Act, 21 U.S.C. section 360bbb-3(b)(1), unless the authorization is terminated or revoked.  Performed at [facility], [place] [address], [place], [place] [address]   Urine culture     Status: Abnormal   Collection Time: 11/11/20  7:23 PM   Specimen: Urine, Clean Catch  Result Value Ref Range Status   Specimen Description   Final    URINE, CLEAN CATCH Performed at [facility], [place] [address] [place], [place] [address]    Special Requests   Final    NONE Performed at [facility], [place] [address], [place], [place] [address]    Culture >=100,000 COLONIES/mL ESCHERICHIA COLI (A)  Final   Report Status 11/13/2020 FINAL  Final   Organism ID, Bacteria ESCHERICHIA COLI (A)  Final      Susceptibility    Escherichia coli - MIC*    AMPICILLIN >=32 RESISTANT Resistant     CEFAZOLIN <=4 SENSITIVE Sensitive     CEFEPIME <=0.12 SENSITIVE Sensitive     CEFTRIAXONE <=0.25 SENSITIVE Sensitive     CIPROFLOXACIN <=0.25 SENSITIVE Sensitive     GENTAMICIN >=16 RESISTANT Resistant     IMIPENEM <=0.25 SENSITIVE Sensitive     NITROFURANTOIN <=16 SENSITIVE Sensitive     TRIMETH/SULFA <=20 SENSITIVE Sensitive     AMPICILLIN/SULBACTAM 16 INTERMEDIATE Intermediate     PIP/TAZO <=4 SENSITIVE Sensitive     * >=100,000 COLONIES/mL ESCHERICHIA COLI  Blood Culture ID Panel (Reflexed)     Status: Abnormal   Collection Time: 11/11/20  7:23 PM  Result Value Ref Range Status   Enterococcus faecalis NOT DETECTED NOT DETECTED Final   Enterococcus Faecium NOT DETECTED NOT DETECTED Final   Listeria monocytogenes NOT DETECTED NOT DETECTED Final   Staphylococcus species NOT DETECTED NOT DETECTED Final   Staphylococcus aureus (BCID) NOT DETECTED NOT DETECTED Final   Staphylococcus epidermidis NOT DETECTED NOT DETECTED Final   Staphylococcus lugdunensis NOT DETECTED NOT DETECTED Final   Streptococcus species NOT DETECTED NOT DETECTED Final   Streptococcus agalactiae NOT DETECTED NOT DETECTED Final   Streptococcus pneumoniae NOT DETECTED NOT DETECTED Final   Streptococcus pyogenes NOT DETECTED NOT DETECTED Final   A.calcoaceticus-baumannii NOT DETECTED NOT DETECTED Final   Bacteroides fragilis NOT DETECTED NOT DETECTED Final   Enterobacterales DETECTED (A) NOT DETECTED Final    Comment: Enterobacterales represent a large order of gram negative bacteria, not a single organism. CRITICAL RESULT CALLED TO, READ BACK BY AND VERIFIED WITH: Christean Grief PharmD 14:00 11/12/20 ([email or web address])    Enterobacter cloacae complex NOT DETECTED NOT DETECTED Final   Escherichia coli DETECTED (A) NOT DETECTED Final    Comment: CRITICAL RESULT CALLED TO, READ BACK BY AND VERIFIED WITH: Christean Grief PharmD 14:00 11/12/20 ([email or web address])    Klebsiella  aerogenes NOT DETECTED NOT DETECTED Final   Klebsiella oxytoca NOT DETECTED NOT DETECTED Final   Klebsiella pneumoniae NOT DETECTED NOT DETECTED Final   Proteus species NOT DETECTED NOT DETECTED Final   Salmonella species NOT DETECTED NOT DETECTED Final   Serratia marcescens NOT DETECTED NOT DETECTED Final   Haemophilus influenzae NOT DETECTED NOT DETECTED Final   Neisseria meningitidis NOT DETECTED NOT DETECTED Final   Pseudomonas aeruginosa NOT DETECTED NOT DETECTED Final   Stenotrophomonas maltophilia NOT DETECTED NOT DETECTED Final   Candida albicans NOT DETECTED NOT  DETECTED Final   Candida auris NOT DETECTED NOT DETECTED Final   Candida glabrata NOT DETECTED NOT DETECTED Final   Candida krusei NOT DETECTED NOT DETECTED Final   Candida parapsilosis NOT DETECTED NOT DETECTED Final   Candida tropicalis NOT DETECTED NOT DETECTED Final   Cryptococcus neoformans/gattii NOT DETECTED NOT DETECTED Final   CTX-M ESBL NOT DETECTED NOT DETECTED Final   Carbapenem resistance IMP NOT DETECTED NOT DETECTED Final   Carbapenem resistance KPC NOT DETECTED NOT DETECTED Final   Carbapenem resistance NDM NOT DETECTED NOT DETECTED Final   Carbapenem resist OXA 48 LIKE NOT DETECTED NOT DETECTED Final   Carbapenem resistance VIM NOT DETECTED NOT DETECTED Final    Comment: Performed at [facility] [address], [place], [place] [address]  Culture, blood (single)     Status: Abnormal   Collection Time: 11/11/20  9:31 PM   Specimen: BLOOD RIGHT HAND  Result Value Ref Range Status   Specimen Description   Final    BLOOD RIGHT HAND Performed at [facility] [address] [place], [place] [address]    Special Requests   Final    BOTTLES DRAWN AEROBIC AND ANAEROBIC Blood Culture adequate volume Performed at [facility] [address], [place], [place] [address]    Culture  Setup Time   Final    GRAM NEGATIVE RODS IN BOTH AEROBIC AND ANAEROBIC  BOTTLES CRITICAL VALUE NOTED.  VALUE IS CONSISTENT WITH PREVIOUSLY REPORTED AND CALLED VALUE.    Culture (A)  Final    ESCHERICHIA COLI SUSCEPTIBILITIES PERFORMED ON PREVIOUS CULTURE WITHIN THE LAST 5 DAYS. Performed at [facility], [place] [address], [place], [place] [address]    Report Status 11/14/2020 FINAL  Final     Labs: Basic Metabolic Panel: Recent Labs  Lab 11/08/20 1517 11/11/20 1923 11/12/20 0557 11/13/20 0550 11/14/20 0543  NA 131* 129* 136 136 135  K 3.9 2.8* 3.4* 3.4* 4.0  CL 94* 91* 101 101 100  CO2 24 26 25 24 23   GLUCOSE 134* 145* 135* 125* 148*  BUN 13 19 14 13 13   CREATININE 0.50* 0.68 0.55 0.55 0.57  CALCIUM 9.6 8.6* 8.1* 8.4* 9.2  MG  --  1.8 1.8 2.0  --    Liver Function Tests: Recent Labs  Lab 11/08/20 1517 11/11/20 1923 11/12/20 0557 11/13/20 0550  AST 25 48* 28 24  ALT 22 55* 39 34  ALKPHOS  --  96 78 81  BILITOT 0.5 1.3* 1.5* 0.6  PROT 6.6 6.8 5.8* 6.3*  ALBUMIN  --  3.7 3.0* 3.2*   Recent Labs  Lab 11/11/20 1923  LIPASE 24   No results for input(s): AMMONIA in the last 168 hours. CBC: Recent Labs  Lab 11/11/20 1923 11/12/20 0557 11/13/20 0550 11/14/20 0543  WBC 21.4* 20.1* 13.2* 8.8  NEUTROABS 19.0* 17.5* 10.2* 5.8  HGB 13.1 11.7* 12.0 13.0  HCT 38.5 35.4* 36.7 40.4  MCV 84.1 84.9 86.4 86.3  PLT 251 213 226 252   Cardiac Enzymes: No results for input(s): CKTOTAL, CKMB, CKMBINDEX, TROPONINI in the last 168 hours. BNP: BNP (last 3 results) No results for input(s): BNP in the last 8760 hours.  ProBNP (last 3 results) No results for input(s): PROBNP in the last 8760 hours.  CBG: Recent Labs  Lab 11/13/20 1120 11/13/20 1625 11/13/20 2100 11/14/20 0758 11/14/20 1147  GLUCAP 117* 120* 157* 125* 143*       Signed:  Quillian Quince  Grandville Silos MD.  [facility] 11/14/2020, 2:41 PM

## 2020-11-30 ENCOUNTER — Ambulatory Visit (INDEPENDENT_AMBULATORY_CARE_PROVIDER_SITE_OTHER): Payer: PPO | Admitting: Adult Health

## 2020-11-30 ENCOUNTER — Encounter: Payer: Self-pay | Admitting: Adult Health

## 2020-11-30 ENCOUNTER — Other Ambulatory Visit: Payer: Self-pay

## 2020-11-30 VITALS — BP 150/84 | Temp 98.5°F | Wt 126.0 lb

## 2020-11-30 DIAGNOSIS — E860 Dehydration: Secondary | ICD-10-CM

## 2020-11-30 DIAGNOSIS — E871 Hypo-osmolality and hyponatremia: Secondary | ICD-10-CM | POA: Diagnosis not present

## 2020-11-30 DIAGNOSIS — Z87440 Personal history of urinary (tract) infections: Secondary | ICD-10-CM

## 2020-11-30 DIAGNOSIS — A419 Sepsis, unspecified organism: Secondary | ICD-10-CM | POA: Diagnosis not present

## 2020-11-30 DIAGNOSIS — N390 Urinary tract infection, site not specified: Secondary | ICD-10-CM | POA: Diagnosis not present

## 2020-11-30 DIAGNOSIS — Z8619 Personal history of other infectious and parasitic diseases: Secondary | ICD-10-CM

## 2020-11-30 DIAGNOSIS — I10 Essential (primary) hypertension: Secondary | ICD-10-CM

## 2020-11-30 DIAGNOSIS — F17200 Nicotine dependence, unspecified, uncomplicated: Secondary | ICD-10-CM | POA: Diagnosis not present

## 2020-11-30 NOTE — Progress Notes (Signed)
Subjective:    Patient ID: Shelly Clark, female    DOB: 22-Sep-1946, 75 y.o.   MRN: [id number]  HPI 75 year old female who  has a past medical history of Benign hematuria, Diabetes mellitus without complication ([place]), Esophageal dysmotility, Hyperlipidemia, Hypertension, Migraines, Nicotine dependence, cigarettes, uncomplicated (123456), Osteoporosis, and Tobacco abuse.  She presents to the clinic today for TCM visit   Admit Date: 11/11/2020 Discharge Date 11/14/2020  She presented to [facility] emergency room on 11/11/2020 for worsening weakness, abdominal pain, and fevers.  She reported that over the long months prior she had been experiencing intermittent low abdominal pain.  Initially this pain was mild in intensity but in the past several days in particular the pain became more severe.  Pain was described as sharp in quality and mostly located in the right lower quadrant and nonradiating.  She was complaining of associated generalized weakness and dysuria.  She had also been complaining of poor appetite and has been worsening over the span of time.  Reported a fever up to 104.5 the evening of 12/16  On evaluation in the emergency department she was found to have substantial leukocytosis as well as intermittent tachycardia pia and tachycardia with fevers as mentioned above which was concerning for early sepsis.  CT imaging of the abdomen pelvis revealed urothelial enhancement of the right renal pelvis and right ureter concerning for ascending urinary tract infection.  Hospital Course   1. Sepsis secondary to E. coli bacteremia and E. coli -She was cultured with blood cultures were positive for E. coli, urine cultures positive for E. coli.  She was placed empirically on IV Rocephin and dose adjusted to 2 g daily.  She was hydrated with IV fluids.  Patient remained afebrile and improved clinically.  Leukocytosis trended down and resolved by day of discharge.  She was discharged home on  5 more days of oral Keflex to complete an 8-day course of antibiotic treatment.  2.  Dehydration and hyponatremia -Hyponatremia secondary to volume depletion and dehydration.  Hyponatremia improved with hydration.  Patient's HCTZ was held during hospitalization.  Patient hydrated with IV fluids.  Advise outpatient follow-up with PCP.  If patient with continued hyponatremia follow-up will recommend discontinuation of patient's HCTZ  3. Hypertension  -Stable throughout hospital admission.  She was maintained on home regimen of lisinopril and Norvasc.  As mentioned above HCTZ was held during the hospitalization and was resumed on discharge.  4.  Expiratory wheezing -Some minimal expiratory wheezing noted during hospitalization.  Patient with an extensive tobacco history.  Placed on Dulera, scheduled duo nebs with significant clinical improvement.  She was discharged home on Symbicort as well as albuterol MDI as needed.  She was advised to quit smoking  Today she reports that she is starting to feel better but continues to be fatigued and does not have the energy like she used to.  She has not experienced any fevers or chills, urinary frequency urgency dysuria or abdominal pain since being discharged.  She continues to take lisinopril and Norvasc but has not taken HCTZ.  Blood pressure is elevated today in the office.    Review of Systems  Constitutional: Positive for activity change and fatigue.  Respiratory: Negative.   Cardiovascular: Negative.   Gastrointestinal: Negative.   Genitourinary: Negative.   Musculoskeletal: Negative.   Neurological: Positive for weakness.  Hematological: Negative.   All other systems reviewed and are negative.  Past Medical History:  Diagnosis Date  . Benign hematuria  neg urology workup  . Diabetes mellitus without complication ([place])   . Esophageal dysmotility   . Hyperlipidemia   . Hypertension   . Migraines   . Nicotine dependence, cigarettes,  uncomplicated [id number]  . Osteoporosis   . Tobacco abuse     Social History   Socioeconomic History  . Marital status: Married    Spouse name: Not on file  . Number of children: Not on file  . Years of education: Not on file  . Highest education level: Not on file  Occupational History  . 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 Smoking status: Current Every Day Smoker    Packs/day: 1.00    Years: 40.00    Pack years: 40.00    Types: Cigarettes  . Smokeless tobacco: Never Used  Substance and Sexual Activity  . Alcohol use: No  . Drug use: No  . Sexual activity: Not on file  Other Topics Concern  . Not on file  Social History Narrative   ** Merged History Encounter **       Owns a home  She is married  Two children      Social Determinants of Radio broadcast assistant Strain: Not on file  Food Insecurity: Not on file  Transportation Needs: Not on file  Physical Activity: Not on file  Stress: Not on file  Social Connections: Not on file  Intimate Partner Violence: Not on file    Past Surgical History:  Procedure Laterality Date  . ABDOMINAL HYSTERECTOMY      Family History  Problem Relation Age of Onset  . Ovarian cancer Mother   . Diabetes Mother   . Hypertension Mother   . Hyperlipidemia Mother   . Diabetes Father   . Hypertension Father   . Hyperlipidemia Father   . Hypertension Brother   . Hypertension Daughter   . Hypertension Daughter     Allergies  Allergen Reactions  . Codeine Nausea And Vomiting    Current Outpatient Medications on File Prior to Visit  Medication Sig Dispense Refill  . albuterol (VENTOLIN HFA) 108 (90 Base) MCG/ACT inhaler Inhale 2 puffs into the lungs every 6 (six) hours as needed for wheezing or shortness of breath. 8 g 2  . amLODipine (NORVASC) 10 MG tablet TAKE 1 TABLET(10 MG) BY MOUTH DAILY 90 tablet 3  . aspirin EC 81 MG tablet Take 81 mg by mouth daily.    . B  Complex-C (B-COMPLEX WITH VITAMIN C) tablet Take 1 tablet by mouth daily.    . budesonide-formoterol (SYMBICORT) 80-4.5 MCG/ACT inhaler Inhale 2 puffs into the lungs in the morning and at bedtime. 1 each 2  . clonazePAM (KLONOPIN) 0.5 MG tablet Take 1 tablet (0.5 mg total) by mouth at bedtime as needed. for sleep 30 tablet 2  . FLUoxetine (PROZAC) 10 MG capsule TAKE 1 CAPSULE(10 MG) BY MOUTH DAILY (Patient taking differently: Take 10 mg by mouth daily.) 90 capsule 1  . glipiZIDE (GLUCOTROL XL) 5 MG 24 hr tablet TAKE 1 TABLET(5 MG) BY MOUTH DAILY WITH BREAKFAST (Patient taking differently: Take 5 mg by mouth daily with breakfast.) 90 tablet 1  . hydrochlorothiazide (MICROZIDE) 12.5 MG capsule TAKE 1 CAPSULE(12.5 MG) BY MOUTH DAILY (Patient taking differently: Take 12.5 mg by mouth daily.) 90 capsule 1  . lisinopril (ZESTRIL) 40 MG tablet TAKE 1 TABLET(40 MG) BY MOUTH DAILY (Patient taking differently: Take 40 mg by mouth daily.) 90  tablet 2  . metFORMIN (GLUCOPHAGE) 500 MG tablet TAKE 1 TABLET BY MOUTH TWICE DAILY WITH A MEAL (Patient taking differently: Take 500 mg by mouth 2 (two) times daily with a meal.) 180 tablet 1  . Multiple Vitamin (MULTIVITAMIN WITH MINERALS) TABS Take 1 tablet by mouth daily.    . nicotine (NICODERM CQ - DOSED IN MG/24 HOURS) 21 mg/24hr patch Place 1 patch (21 mg total) onto the skin daily. 28 patch 0  . pantoprazole (PROTONIX) 20 MG tablet Take 1 tablet (20 mg total) by mouth daily. 30 tablet 0  . simvastatin (ZOCOR) 20 MG tablet TAKE 1 TABLET BY MOUTH EVERY DAY (Patient taking differently: Take 20 mg by mouth daily at 6 PM.) 90 tablet 3  . triamcinolone ointment (KENALOG) 0.5 % Apply 1 application topically 2 (two) times daily. 30 g 1   No current facility-administered medications on file prior to visit.    BP (!) 150/84   Temp 98.5 F (36.9 C)   Wt 126 lb (57.2 kg)   BMI 24.61 kg/m       Objective:   Physical Exam Vitals and nursing note reviewed.   Constitutional:      Appearance: Normal appearance.     Comments: Appears tired    Cardiovascular:     Rate and Rhythm: Normal rate and regular rhythm.     Pulses: Normal pulses.     Heart sounds: Normal heart sounds.  Pulmonary:     Effort: Pulmonary effort is normal.     Breath sounds: Normal breath sounds.  Abdominal:     General: Abdomen is flat. Bowel sounds are normal. There is no distension.     Palpations: Abdomen is soft.     Tenderness: There is no abdominal tenderness. There is no right CVA tenderness or left CVA tenderness.  Skin:    General: Skin is warm and dry.     Capillary Refill: Capillary refill takes less than 2 seconds.  Neurological:     General: No focal deficit present.     Mental Status: She is alert and oriented to person, place, and time.  Psychiatric:        Mood and Affect: Mood normal.        Behavior: Behavior normal.        Thought Content: Thought content normal.        Judgment: Judgment normal.       Assessment & Plan:  1. Sepsis due to urinary tract infection [facility]) -Reviewed hospital notes, imaging, labs, discharge instructions with the patient.  All questions answered to the best of my ability.  She seems to be recovering well.  She would like a urinalysis and urine culture done "just to make sure". - CBC with Differential/Platelet; Future - CMP; Future - Urinalysis; Future - Culture, Urine; Future - Culture, Urine - Urinalysis - CMP - CBC with Differential/Platelet  2. TOBACCO ABUSE -Encourage to quit  3. Essential hypertension -Blood pressure elevated in the office today.  We will wait for labs to come back and then likely reintroduce HCTZ. - CBC with Differential/Platelet; Future - CMP; Future - CMP - CBC with Differential/Platelet  4. Dehydration with hyponatremia  - CBC with Differential/Platelet; Future - CMP; Future - CMP - CBC with Differential/Platelet   Shirline Frees, NP

## 2020-12-01 LAB — CBC WITH DIFFERENTIAL/PLATELET
Basophils Absolute: 0.1 10*3/uL (ref 0.0–0.1)
Basophils Relative: 1.4 % (ref 0.0–3.0)
Eosinophils Absolute: 0.1 10*3/uL (ref 0.0–0.7)
Eosinophils Relative: 1.1 % (ref 0.0–5.0)
HCT: 44.3 % (ref 36.0–46.0)
Hemoglobin: 14.5 g/dL (ref 12.0–15.0)
Lymphocytes Relative: 41.1 % (ref 12.0–46.0)
Lymphs Abs: 3.1 10*3/uL (ref 0.7–4.0)
MCHC: 32.7 g/dL (ref 30.0–36.0)
MCV: 86.2 fl (ref 78.0–100.0)
Monocytes Absolute: 0.4 10*3/uL (ref 0.1–1.0)
Monocytes Relative: 5.5 % (ref 3.0–12.0)
Neutro Abs: 3.9 10*3/uL (ref 1.4–7.7)
Neutrophils Relative %: 50.9 % (ref 43.0–77.0)
Platelets: 336 10*3/uL (ref 150.0–400.0)
RBC: 5.14 Mil/uL — ABNORMAL HIGH (ref 3.87–5.11)
RDW: 14.3 % (ref 11.5–15.5)
WBC: 7.6 10*3/uL (ref 4.0–10.5)

## 2020-12-01 LAB — COMPREHENSIVE METABOLIC PANEL
ALT: 22 U/L (ref 0–35)
AST: 19 U/L (ref 0–37)
Albumin: 4.9 g/dL (ref 3.5–5.2)
Alkaline Phosphatase: 89 U/L (ref 39–117)
BUN: 20 mg/dL (ref 6–23)
CO2: 27 mEq/L (ref 19–32)
Calcium: 10.1 mg/dL (ref 8.4–10.5)
Chloride: 100 mEq/L (ref 96–112)
Creatinine, Ser: 0.7 mg/dL (ref 0.40–1.20)
GFR: 86.56 mL/min (ref >60.00)
Glucose, Bld: 84 mg/dL (ref 70–99)
Potassium: 4 mEq/L (ref 3.5–5.1)
Sodium: 137 mEq/L (ref 135–145)
Total Bilirubin: 0.6 mg/dL (ref 0.2–1.2)
Total Protein: 7.5 g/dL (ref 6.0–8.3)

## 2020-12-01 LAB — URINALYSIS
Bilirubin Urine: NEGATIVE
Hgb urine dipstick: NEGATIVE
Ketones, ur: NEGATIVE
Leukocytes,Ua: NEGATIVE
Nitrite: NEGATIVE
Specific Gravity, Urine: 1.01 (ref 1.000–1.030)
Urine Glucose: NEGATIVE
Urobilinogen, UA: 0.2 (ref 0.0–1.0)
pH: 6.5 (ref 5.0–8.0)

## 2020-12-01 LAB — URINE CULTURE
MICRO NUMBER:: 11381237
Result:: NO GROWTH
SPECIMEN QUALITY:: ADEQUATE

## 2020-12-05 ENCOUNTER — Other Ambulatory Visit: Payer: Self-pay | Admitting: Family Medicine

## 2020-12-20 ENCOUNTER — Other Ambulatory Visit: Payer: Self-pay

## 2020-12-22 ENCOUNTER — Encounter: Payer: Self-pay | Admitting: Adult Health

## 2020-12-22 ENCOUNTER — Other Ambulatory Visit: Payer: Self-pay

## 2020-12-22 ENCOUNTER — Ambulatory Visit (INDEPENDENT_AMBULATORY_CARE_PROVIDER_SITE_OTHER): Payer: PPO | Admitting: Adult Health

## 2020-12-22 VITALS — BP 150/84 | Temp 98.0°F | Wt 129.0 lb

## 2020-12-22 DIAGNOSIS — G4700 Insomnia, unspecified: Secondary | ICD-10-CM | POA: Diagnosis not present

## 2020-12-22 DIAGNOSIS — R682 Dry mouth, unspecified: Secondary | ICD-10-CM | POA: Diagnosis not present

## 2020-12-22 MED ORDER — CLONAZEPAM 0.5 MG PO TABS: 0.5000 mg | ORAL_TABLET | Freq: Every evening | ORAL | 2 refills | Status: DC | PRN

## 2020-12-22 NOTE — Progress Notes (Signed)
Subjective:    Patient ID: Shelly Clark, female    DOB: [birth year], 75 y.o.   MRN: [id number]  HPI 75 year old female who  has a past medical history of Benign hematuria, Diabetes mellitus without complication ([place]), Esophageal dysmotility, Hyperlipidemia, Hypertension, Migraines, Nicotine dependence, cigarettes, uncomplicated (90/24/0973), Osteoporosis, and Tobacco abuse.  She presents to the office today for an acute issue of feeling of dry mouth over the last 1 to 2 weeks.  She reports that her symptoms are only present in the early morning when she wakes up.  She has been using Biotene before she goes to bed and will use it again when she wakes up because of the sedation of dry mouth.  She has been trying to increase her water intake, but does not do a good job mid afternoon to early evening.  She also quit taking Tylenol PM about 10 days ago.  She is out of her Klonopin that she uses for sleep and anxiety and needs a refill of this as well.  She is not taking any other over-the-counter medications  He does continue to smoke.  No caffeine or sodas  Does report that she has through her mouth at night and does snore.  Refuses sleep apnea evaluation   Review of Systems See HPI   Past Medical History:  Diagnosis Date  . Benign hematuria    neg urology workup  . Diabetes mellitus without complication ([place])   . Esophageal dysmotility   . Hyperlipidemia   . Hypertension   . Migraines   . Nicotine dependence, cigarettes, uncomplicated 53/29/9242  . Osteoporosis   . Tobacco abuse     Social History   Socioeconomic History  . Marital status: Married    Spouse name: Not on file  . Number of children: Not on file  . Years of education: Not on file  . Highest education level: Not on file  Occupational History  . 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 Smoking status: Current Every Day Smoker    Packs/day: 1.00    Years: 40.00    Pack  years: 40.00    Types: Cigarettes  . Smokeless tobacco: Never Used  Substance and Sexual Activity  . Alcohol use: No  . Drug use: No  . Sexual activity: Not on file  Other Topics Concern  . Not on file  Social History Narrative   ** Merged History Encounter **       Owns a home  She is married  Two children      Social Determinants of Radio broadcast assistant Strain: Not on file  Food Insecurity: Not on file  Transportation Needs: Not on file  Physical Activity: Not on file  Stress: Not on file  Social Connections: Not on file  Intimate Partner Violence: Not on file    Past Surgical History:  Procedure Laterality Date  . ABDOMINAL HYSTERECTOMY      Family History  Problem Relation Age of Onset  . Ovarian cancer Mother   . Diabetes Mother   . Hypertension Mother   . Hyperlipidemia Mother   . Diabetes Father   . Hypertension Father   . Hyperlipidemia Father   . Hypertension Brother   . Hypertension Daughter   . Hypertension Daughter     Allergies  Allergen Reactions  . Codeine Nausea And Vomiting    Current Outpatient Medications on File Prior to Visit  Medication Sig Dispense Refill  . albuterol (VENTOLIN HFA) 108 (90 Base) MCG/ACT inhaler Inhale 2 puffs into the lungs every 6 (six) hours as needed for wheezing or shortness of breath. 8 g 2  . amLODipine (NORVASC) 10 MG tablet TAKE 1 TABLET(10 MG) BY MOUTH DAILY 90 tablet 3  . aspirin EC 81 MG tablet Take 81 mg by mouth daily.    . B Complex-C (B-COMPLEX WITH VITAMIN C) tablet Take 1 tablet by mouth daily.    . budesonide-formoterol (SYMBICORT) 80-4.5 MCG/ACT inhaler Inhale 2 puffs into the lungs in the morning and at bedtime. 1 each 2  . FLUoxetine (PROZAC) 10 MG capsule TAKE 1 CAPSULE(10 MG) BY MOUTH DAILY (Patient taking differently: Take 10 mg by mouth daily.) 90 capsule 1  . glipiZIDE (GLUCOTROL XL) 5 MG 24 hr tablet TAKE 1 TABLET(5 MG) BY MOUTH DAILY WITH BREAKFAST (Patient taking differently: Take  5 mg by mouth daily with breakfast.) 90 tablet 1  . hydrochlorothiazide (MICROZIDE) 12.5 MG capsule TAKE 1 CAPSULE(12.5 MG) BY MOUTH DAILY (Patient taking differently: Take 12.5 mg by mouth daily.) 90 capsule 1  . lisinopril (ZESTRIL) 40 MG tablet TAKE 1 TABLET(40 MG) BY MOUTH DAILY (Patient taking differently: Take 40 mg by mouth daily.) 90 tablet 2  . metFORMIN (GLUCOPHAGE) 500 MG tablet TAKE 1 TABLET BY MOUTH TWICE DAILY WITH A MEAL (Patient taking differently: Take 500 mg by mouth 2 (two) times daily with a meal.) 180 tablet 1  . Multiple Vitamin (MULTIVITAMIN WITH MINERALS) TABS Take 1 tablet by mouth daily.    . nicotine (NICODERM CQ - DOSED IN MG/24 HOURS) 21 mg/24hr patch Place 1 patch (21 mg total) onto the skin daily. 28 patch 0  . pantoprazole (PROTONIX) 20 MG tablet Take 1 tablet (20 mg total) by mouth daily. 30 tablet 0  . simvastatin (ZOCOR) 20 MG tablet TAKE 1 TABLET BY MOUTH EVERY DAY (Patient taking differently: Take 20 mg by mouth daily at 6 PM.) 90 tablet 3  . triamcinolone ointment (KENALOG) 0.5 % Apply 1 application topically 2 (two) times daily. 30 g 1   No current facility-administered medications on file prior to visit.    BP (!) 150/84   Temp 98 F (36.7 C)   Wt 129 lb (58.5 kg)   BMI 25.19 kg/m       Objective:   Physical Exam Vitals and nursing note reviewed.  Constitutional:      Appearance: Normal appearance.  HENT:     Nose: Nose normal.     Mouth/Throat:     Mouth: Mucous membranes are moist.     Pharynx: Oropharynx is clear. No oropharyngeal exudate or posterior oropharyngeal erythema.  Cardiovascular:     Rate and Rhythm: Normal rate and regular rhythm.     Pulses: Normal pulses.     Heart sounds: Normal heart sounds.  Pulmonary:     Effort: Pulmonary effort is normal.     Breath sounds: Normal breath sounds.  Musculoskeletal:        General: Normal range of motion.  Skin:    General: Skin is warm and dry.     Capillary Refill: Capillary  refill takes less than 2 seconds.  Psychiatric:        Mood and Affect: Mood normal.        Behavior: Behavior normal.        Thought Content: Thought content normal.        Judgment: Judgment normal.  Assessment & Plan:  1. Dry mouth -Mouth is moist and clear today in the office.  No abnormalities noted. -Continue to use Biotene, quitting smoking will help, sucking on sugar-free candies before bed will help.  Increase water intake in the afternoon and early evening. -Follow-up as needed  2. Insomnia, unspecified type  - clonazePAM (KLONOPIN) 0.5 MG tablet; Take 1 tablet (0.5 mg total) by mouth at bedtime as needed. for sleep  Dispense: 30 tablet; Refill: 2   [name], NP

## 2021-01-06 ENCOUNTER — Telehealth (INDEPENDENT_AMBULATORY_CARE_PROVIDER_SITE_OTHER): Payer: PPO | Admitting: Adult Health

## 2021-01-06 ENCOUNTER — Other Ambulatory Visit: Payer: Self-pay

## 2021-01-06 ENCOUNTER — Encounter: Payer: Self-pay | Admitting: Adult Health

## 2021-01-06 DIAGNOSIS — J069 Acute upper respiratory infection, unspecified: Secondary | ICD-10-CM

## 2021-01-06 MED ORDER — DOXYCYCLINE HYCLATE 100 MG PO CAPS: 100.0000 mg | ORAL_CAPSULE | Freq: Two times a day (BID) | ORAL | 0 refills | Status: DC

## 2021-01-06 NOTE — Progress Notes (Signed)
Virtual Visit via Telephone Note  I connected with Shelly Clark on 01/06/21 at 11:00 AM EST by telephone and verified that I am speaking with the correct person using two identifiers.   I discussed the limitations, risks, security and privacy concerns of performing an evaluation and management service by telephone and the availability of in person appointments. I also discussed with the patient that there may be a patient responsible charge related to this service. The patient expressed understanding and agreed to proceed.  Location patient: home Location provider: work or home office Participants present for the call: patient, provider Patient did not have a visit in the prior 7 days to address this/these issue(s).   History of Present Illness: 75 year old female who is being evaluated today for an acute issue.  Symptoms include shortness of breath, wheezing, productive cough with brown sputum, low-grade fever up to 99 degrees, and chills.  Symptoms have been present for roughly 10 days.  She is fully vaccinated against COVID-19.  She has not been using any over-the-counter medication  Reports that she has not had a cigarette in 3 days due to pain from smoking   Observations/Objective: Patient sounds cheerful and well on the phone. I do not appreciate any SOB. Speech and thought processing are grossly intact. Patient reported vitals:  Assessment and Plan: 1. Upper respiratory tract infection, unspecified type -We will have her get Covid testing done today.  Cover for bacterial respiratory infection with doxycycline.  Encouraged to continue to be smoke-free.  Follow-up if no improvement over the next 3 to 4 days - doxycycline (VIBRAMYCIN) 100 MG capsule; Take 1 capsule (100 mg total) by mouth 2 (two) times daily.  Dispense: 14 capsule; Refill: 0   Follow Up Instructions:  I did not refer this patient for an OV in the next 24 hours for this/these issue(s).  I discussed the  assessment and treatment plan with the patient. The patient was provided an opportunity to ask questions and all were answered. The patient agreed with the plan and demonstrated an understanding of the instructions.   The patient was advised to call back or seek an in-person evaluation if the symptoms worsen or if the condition fails to improve as anticipated.  I provided 15 minutes of non-face-to-face time during this encounter.   [name], NP

## 2021-01-25 ENCOUNTER — Other Ambulatory Visit: Payer: Self-pay

## 2021-01-25 ENCOUNTER — Telehealth (INDEPENDENT_AMBULATORY_CARE_PROVIDER_SITE_OTHER): Payer: PPO | Admitting: Adult Health

## 2021-01-25 DIAGNOSIS — J069 Acute upper respiratory infection, unspecified: Secondary | ICD-10-CM

## 2021-01-25 MED ORDER — PREDNISONE 10 MG PO TABS: 20.0000 mg | ORAL_TABLET | Freq: Every day | ORAL | 0 refills | Status: DC

## 2021-01-25 NOTE — Progress Notes (Signed)
Virtual Visit via Telephone Note  I connected with Shelly Clark on 01/25/21 at  4:30 PM EST by telephone and verified that I am speaking with the correct person using two identifiers.   I discussed the limitations, risks, security and privacy concerns of performing an evaluation and management service by telephone and the availability of in person appointments. I also discussed with the patient that there may be a patient responsible charge related to this service. The patient expressed understanding and agreed to proceed.  Location patient: home Location provider: work or home office Participants present for the call: patient, provider Patient did not have a visit in the prior 7 days to address this/these issue(s).   History of Present Illness: This 75 year old female who is being evaluated today for an acute issue.  Her symptoms started roughly 24 hours ago.  Symptoms include intermittent chills, questionable fever ( currently 98.0), mild shortness of breath with a dry cough.  She has been using her albuterol inhaler which helps "a little bit".  She does not feel acutely ill.  Took 2 Covid test earlier today and both were negative.  She does continue to smoke but is trying to cut back   Observations/Objective: Patient sounds cheerful and well on the phone. I do not appreciate any SOB. Speech and thought processing are grossly intact. Patient reported vitals:  Assessment and Plan: 1. Viral upper respiratory tract infection -Possible viral upper respiratory infection versus possible COPD flare.  Will withhold antibiotic therapy since it is only been 24 hours.  Send in prednisone to help with shortness of breath and cough.  She will follow-up if symptoms worsen or continue over the next week. - predniSONE (DELTASONE) 10 MG tablet; Take 2 tablets (20 mg total) by mouth daily with breakfast.  Dispense: 7 tablet; Refill: 0   Follow Up Instructions:  I did not refer this patient for an  OV in the next 24 hours for this/these issue(s).  I discussed the assessment and treatment plan with the patient. The patient was provided an opportunity to ask questions and all were answered. The patient agreed with the plan and demonstrated an understanding of the instructions.   The patient was advised to call back or seek an in-person evaluation if the symptoms worsen or if the condition fails to improve as anticipated.  I provided 15 minutes of non-face-to-face time during this encounter.   [name], NP

## 2021-01-26 ENCOUNTER — Ambulatory Visit: Payer: PPO

## 2021-01-26 DIAGNOSIS — Z0000 Encounter for general adult medical examination without abnormal findings: Secondary | ICD-10-CM

## 2021-01-26 NOTE — Patient Instructions (Incomplete)
Shelly Clark , Thank you for taking time to come for your Medicare Wellness Visit. I appreciate your ongoing commitment to your health goals. Please review the following plan we discussed and let me know if I can assist you in the future.   Screening recommendations/referrals: Colonoscopy: *** Mammogram: *** Bone Density: *** Recommended yearly ophthalmology/optometry visit for glaucoma screening and checkup Recommended yearly dental visit for hygiene and checkup  Vaccinations: Influenza vaccine: *** Pneumococcal vaccine: *** Tdap vaccine: *** Shingles vaccine: ***   Covid-19:***  Advanced directives: ***  Conditions/risks identified: ***  Next appointment: Follow up in one year for your annual wellness visit ***   Preventive Care 65 Years and Older, Female Preventive care refers to lifestyle choices and visits with your health care provider that can promote health and wellness. What does preventive care include?  A yearly physical exam. This is also called an annual well check.  Dental exams once or twice a year.  Routine eye exams. Ask your health care provider how often you should have your eyes checked.  Personal lifestyle choices, including:  Daily care of your teeth and gums.  Regular physical activity.  Eating a healthy diet.  Avoiding tobacco and drug use.  Limiting alcohol use.  Practicing safe sex.  Taking low-dose aspirin every day.  Taking vitamin and mineral supplements as recommended by your health care provider. What happens during an annual well check? The services and screenings done by your health care provider during your annual well check will depend on your age, overall health, lifestyle risk factors, and family history of disease. Counseling  Your health care provider may ask you questions about your:  Alcohol use.  Tobacco use.  Drug use.  Emotional well-being.  Home and relationship well-being.  Sexual activity.  Eating  habits.  History of falls.  Memory and ability to understand (cognition).  Work and work Statistician.  Reproductive health. Screening  You may have the following tests or measurements:  Height, weight, and BMI.  Blood pressure.  Lipid and cholesterol levels. These may be checked every 5 years, or more frequently if you are over 78 years old.  Skin check.  Lung cancer screening. You may have this screening every year starting at age 62 if you have a 30-pack-year history of smoking and currently smoke or have quit within the past 15 years.  Fecal occult blood test (FOBT) of the stool. You may have this test every year starting at age 76.  Flexible sigmoidoscopy or colonoscopy. You may have a sigmoidoscopy every 5 years or a colonoscopy every 10 years starting at age 42.  Hepatitis C blood test.  Hepatitis B blood test.  Sexually transmitted disease (STD) testing.  Diabetes screening. This is done by checking your blood sugar (glucose) after you have not eaten for a while (fasting). You may have this done every 1-3 years.  Bone density scan. This is done to screen for osteoporosis. You may have this done starting at age 52.  Mammogram. This may be done every 1-2 years. Talk to your health care provider about how often you should have regular mammograms. Talk with your health care provider about your test results, treatment options, and if necessary, the need for more tests. Vaccines  Your health care provider may recommend certain vaccines, such as:  Influenza vaccine. This is recommended every year.  Tetanus, diphtheria, and acellular pertussis (Tdap, Td) vaccine. You may need a Td booster every 10 years.  Zoster vaccine. You may need  this after age 44.  Pneumococcal 13-valent conjugate (PCV13) vaccine. One dose is recommended after age 69.  Pneumococcal polysaccharide (PPSV23) vaccine. One dose is recommended after age 74. Talk to your health care provider about which  screenings and vaccines you need and how often you need them. This information is not intended to replace advice given to you by your health care provider. Make sure you discuss any questions you have with your health care provider. Document Released: 12/10/2015 Document Revised: 08/02/2016 Document Reviewed: 09/14/2015 Elsevier Interactive Patient Education  2017 [place] Prevention in the Home Falls can cause injuries. They can happen to people of all ages. There are many things you can do to make your home safe and to help prevent falls. What can I do on the outside of my home?  Regularly fix the edges of walkways and driveways and fix any cracks.  Remove anything that might make you trip as you walk through a door, such as a raised step or threshold.  Trim any bushes or trees on the path to your home.  Use bright outdoor lighting.  Clear any walking paths of anything that might make someone trip, such as rocks or tools.  Regularly check to see if handrails are loose or broken. Make sure that both sides of any steps have handrails.  Any raised decks and porches should have guardrails on the edges.  Have any leaves, snow, or ice cleared regularly.  Use sand or salt on walking paths during winter.  Clean up any spills in your garage right away. This includes oil or grease spills. What can I do in the bathroom?  Use night lights.  Install grab bars by the toilet and in the tub and shower. Do not use towel bars as grab bars.  Use non-skid mats or decals in the tub or shower.  If you need to sit down in the shower, use a plastic, non-slip stool.  Keep the floor dry. Clean up any water that spills on the floor as soon as it happens.  Remove soap buildup in the tub or shower regularly.  Attach bath mats securely with double-sided non-slip rug tape.  Do not have throw rugs and other things on the floor that can make you trip. What can I do in the bedroom?  Use  night lights.  Make sure that you have a light by your bed that is easy to reach.  Do not use any sheets or blankets that are too big for your bed. They should not hang down onto the floor.  Have a firm chair that has side arms. You can use this for support while you get dressed.  Do not have throw rugs and other things on the floor that can make you trip. What can I do in the kitchen?  Clean up any spills right away.  Avoid walking on wet floors.  Keep items that you use a lot in easy-to-reach places.  If you need to reach something above you, use a strong step stool that has a grab bar.  Keep electrical cords out of the way.  Do not use floor polish or wax that makes floors slippery. If you must use wax, use non-skid floor wax.  Do not have throw rugs and other things on the floor that can make you trip. What can I do with my stairs?  Do not leave any items on the stairs.  Make sure that there are handrails on both sides of  the stairs and use them. Fix handrails that are broken or loose. Make sure that handrails are as long as the stairways.  Check any carpeting to make sure that it is firmly attached to the stairs. Fix any carpet that is loose or worn.  Avoid having throw rugs at the top or bottom of the stairs. If you do have throw rugs, attach them to the floor with carpet tape.  Make sure that you have a light switch at the top of the stairs and the bottom of the stairs. If you do not have them, ask someone to add them for you. What else can I do to help prevent falls?  Wear shoes that:  Do not have high heels.  Have rubber bottoms.  Are comfortable and fit you well.  Are closed at the toe. Do not wear sandals.  If you use a stepladder:  Make sure that it is fully opened. Do not climb a closed stepladder.  Make sure that both sides of the stepladder are locked into place.  Ask someone to hold it for you, if possible.  Clearly mark and make sure that you  can see:  Any grab bars or handrails.  First and last steps.  Where the edge of each step is.  Use tools that help you move around (mobility aids) if they are needed. These include:  Canes.  Walkers.  Scooters.  Crutches.  Turn on the lights when you go into a dark area. Replace any light bulbs as soon as they burn out.  Set up your furniture so you have a clear path. Avoid moving your furniture around.  If any of your floors are uneven, fix them.  If there are any pets around you, be aware of where they are.  Review your medicines with your doctor. Some medicines can make you feel dizzy. This can increase your chance of falling. Ask your doctor what other things that you can do to help prevent falls. This information is not intended to replace advice given to you by your health care provider. Make sure you discuss any questions you have with your health care provider. Document Released: 09/09/2009 Document Revised: 04/20/2016 Document Reviewed: 12/18/2014 Elsevier Interactive Patient Education  2017 [employer].

## 2021-01-27 ENCOUNTER — Ambulatory Visit (INDEPENDENT_AMBULATORY_CARE_PROVIDER_SITE_OTHER): Payer: PPO | Admitting: Adult Health

## 2021-01-27 ENCOUNTER — Other Ambulatory Visit: Payer: Self-pay

## 2021-01-27 ENCOUNTER — Encounter: Payer: Self-pay | Admitting: Adult Health

## 2021-01-27 ENCOUNTER — Ambulatory Visit (INDEPENDENT_AMBULATORY_CARE_PROVIDER_SITE_OTHER): Payer: PPO

## 2021-01-27 VITALS — BP 100/60 | HR 77 | Temp 97.9°F | Ht 60.0 in | Wt 133.1 lb

## 2021-01-27 DIAGNOSIS — R0602 Shortness of breath: Secondary | ICD-10-CM

## 2021-01-27 DIAGNOSIS — R300 Dysuria: Secondary | ICD-10-CM

## 2021-01-27 DIAGNOSIS — R079 Chest pain, unspecified: Secondary | ICD-10-CM

## 2021-01-27 DIAGNOSIS — R112 Nausea with vomiting, unspecified: Secondary | ICD-10-CM

## 2021-01-27 DIAGNOSIS — R1030 Lower abdominal pain, unspecified: Secondary | ICD-10-CM

## 2021-01-27 MED ORDER — ONDANSETRON HCL 4 MG/2ML IJ SOLN
4.0000 mg | Freq: Once | INTRAMUSCULAR | Status: AC
  Administered 2021-01-27: 4 mg via INTRAMUSCULAR

## 2021-01-27 MED ORDER — ONDANSETRON HCL 4 MG/2ML IJ SOLN: 4.0000 mg | Freq: Once | INTRAMUSCULAR | Status: DC

## 2021-01-27 MED ORDER — ONDANSETRON HCL 4 MG PO TABS: 4.0000 mg | ORAL_TABLET | Freq: Three times a day (TID) | ORAL | 0 refills | Status: DC | PRN

## 2021-01-27 NOTE — Progress Notes (Signed)
Subjective:    Patient ID: [name], female    DOB: 1946-11-19, 75 y.o.   MRN: [id number]  HPI  75 year old female who  has a past medical history of Benign hematuria, Diabetes mellitus without complication ([place]), Esophageal dysmotility, Hyperlipidemia, Hypertension, Migraines, Nicotine dependence, cigarettes, uncomplicated (85/63/1497), Osteoporosis, and Tobacco abuse.  She presents to the office today for an acute issue.   She was evaluated two days ago via phone visit for suspected URI. She had taken two covid tests and both were negative. Symptoms have been present for rougly 24 hours and included chills, ? Fever, mild shortness of breath with a dry cough. She was prescribed a prednisone taper.  She took 2 Covid test earlier that day, both were negative.  Yesterday, she took the prednisone and became dizzy, nauseated, and developed a headache.  She also reports taking her temperature and had a fever up to 100.4.  She took Tylenol her fever was able to come down to 97.8 after an hour.  Reports last night that she started having dysuria but this resolved but currently has lower pelvic pain.  Today when she took her prednisone dose she again became nauseated and started vomiting, she continued vomiting on the way to the office today.  In the office she feels nauseous.  Also reports some left-sided chest pain.  She did take both days of prednisone on an empty stomach.  Continues to feel short of breath, using her albuterol inhaler and Symbicort and feels as though this helps relieve shortness of breath for a short period of time   Review of Systems See HPI   Past Medical History:  Diagnosis Date  . Benign hematuria    neg urology workup  . Diabetes mellitus without complication ([place])   . Esophageal dysmotility   . Hyperlipidemia   . Hypertension   . Migraines   . Nicotine dependence, cigarettes, uncomplicated 02/63/7858  . Osteoporosis   . Tobacco abuse     Social History    Socioeconomic History  . Marital status: Married    Spouse name: Not on file  . Number of children: Not on file  . Years of education: Not on file  . Highest education level: Not on file  Occupational History  . 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 Smoking status: Current Every Day Smoker    Packs/day: 1.00    Years: 40.00    Pack years: 40.00    Types: Cigarettes  . Smokeless tobacco: Never Used  Substance and Sexual Activity  . Alcohol use: No  . Drug use: No  . Sexual activity: Not on file  Other Topics Concern  . Not on file  Social History Narrative   ** Merged History Encounter **       Owns a home  She is married  Two children      Social Determinants of Radio broadcast assistant Strain: Not on file  Food Insecurity: Not on file  Transportation Needs: Not on file  Physical Activity: Not on file  Stress: Not on file  Social Connections: Not on file  Intimate Partner Violence: Not on file    Past Surgical History:  Procedure Laterality Date  . ABDOMINAL HYSTERECTOMY      Family History  Problem Relation Age of Onset  . Ovarian cancer Mother   . Diabetes Mother   . Hypertension Mother   . Hyperlipidemia Mother   .  Diabetes Father   . Hypertension Father   . Hyperlipidemia Father   . Hypertension Brother   . Hypertension Daughter   . Hypertension Daughter     Allergies  Allergen Reactions  . Codeine Nausea And Vomiting    Current Outpatient Medications on File Prior to Visit  Medication Sig Dispense Refill  . albuterol (VENTOLIN HFA) 108 (90 Base) MCG/ACT inhaler Inhale 2 puffs into the lungs every 6 (six) hours as needed for wheezing or shortness of breath. 8 g 2  . amLODipine (NORVASC) 10 MG tablet TAKE 1 TABLET(10 MG) BY MOUTH DAILY 90 tablet 3  . aspirin EC 81 MG tablet Take 81 mg by mouth daily.    . B Complex-C (B-COMPLEX WITH VITAMIN C) tablet Take 1 tablet by mouth daily.    .  budesonide-formoterol (SYMBICORT) 80-4.5 MCG/ACT inhaler Inhale 2 puffs into the lungs in the morning and at bedtime. 1 each 2  . clonazePAM (KLONOPIN) 0.5 MG tablet Take 1 tablet (0.5 mg total) by mouth at bedtime as needed. for sleep 30 tablet 2  . FLUoxetine (PROZAC) 10 MG capsule TAKE 1 CAPSULE(10 MG) BY MOUTH DAILY (Patient taking differently: Take 10 mg by mouth daily.) 90 capsule 1  . glipiZIDE (GLUCOTROL XL) 5 MG 24 hr tablet TAKE 1 TABLET(5 MG) BY MOUTH DAILY WITH BREAKFAST (Patient taking differently: Take 5 mg by mouth daily with breakfast.) 90 tablet 1  . hydrochlorothiazide (MICROZIDE) 12.5 MG capsule TAKE 1 CAPSULE(12.5 MG) BY MOUTH DAILY (Patient taking differently: Take 12.5 mg by mouth daily.) 90 capsule 1  . lisinopril (ZESTRIL) 40 MG tablet TAKE 1 TABLET(40 MG) BY MOUTH DAILY (Patient taking differently: Take 40 mg by mouth daily.) 90 tablet 2  . metFORMIN (GLUCOPHAGE) 500 MG tablet TAKE 1 TABLET BY MOUTH TWICE DAILY WITH A MEAL (Patient taking differently: Take 500 mg by mouth 2 (two) times daily with a meal.) 180 tablet 1  . Multiple Vitamin (MULTIVITAMIN WITH MINERALS) TABS Take 1 tablet by mouth daily.    . nicotine (NICODERM CQ - DOSED IN MG/24 HOURS) 21 mg/24hr patch Place 1 patch (21 mg total) onto the skin daily. 28 patch 0  . pantoprazole (PROTONIX) 20 MG tablet Take 1 tablet (20 mg total) by mouth daily. 30 tablet 0  . predniSONE (DELTASONE) 10 MG tablet Take 2 tablets (20 mg total) by mouth daily with breakfast. 7 tablet 0  . simvastatin (ZOCOR) 20 MG tablet TAKE 1 TABLET BY MOUTH EVERY DAY (Patient taking differently: Take 20 mg by mouth daily at 6 PM.) 90 tablet 3  . triamcinolone ointment (KENALOG) 0.5 % Apply 1 application topically 2 (two) times daily. 30 g 1   No current facility-administered medications on file prior to visit.    BP 100/60 (BP Location: Left Arm, Patient Position: Sitting, Cuff Size: Normal)   Pulse 77   Temp 97.9 F (36.6 C) (Oral)   Ht 5'  (1.524 m)   Wt 133 lb 1.6 oz (60.4 kg)   SpO2 94%   BMI 25.99 kg/m       Objective:   Physical Exam Vitals reviewed.  Constitutional:      Appearance: Normal appearance. She is ill-appearing.  Cardiovascular:     Rate and Rhythm: Normal rate and regular rhythm.     Pulses: Normal pulses.     Heart sounds: Normal heart sounds.  Pulmonary:     Effort: Pulmonary effort is normal.     Breath sounds: Examination of the right-upper field  reveals wheezing. Examination of the right-middle field reveals wheezing. Wheezing present. No rhonchi or rales.  Abdominal:     General: Abdomen is flat. Bowel sounds are normal.     Palpations: Abdomen is soft.     Tenderness: There is abdominal tenderness in the suprapubic area. There is no right CVA tenderness or left CVA tenderness.  Musculoskeletal:        General: Normal range of motion.  Skin:    General: Skin is warm and dry.     Capillary Refill: Capillary refill takes less than 2 seconds.  Neurological:     General: No focal deficit present.     Mental Status: She is alert and oriented to person, place, and time.  Psychiatric:        Mood and Affect: Mood normal.        Behavior: Behavior normal.        Thought Content: Thought content normal.        Judgment: Judgment normal.       Assessment & Plan:  1. Chest pain, unspecified type  - EKG 12-Lead- NSR, rate 75 - CBC with Differential/Platelet - Comprehensive metabolic panel  2. Intractable vomiting with nausea, unspecified vomiting type -Possibly from taking prednisone on an empty stomach.  Like labs, UA, and chest x-ray. - ondansetron (ZOFRAN) injection 4 mg - CBC with Differential/Platelet - Comprehensive metabolic panel - POC Urinalysis Dipstick - DG Chest 2 View; Future - ondansetron (ZOFRAN) 4 MG tablet; Take 1 tablet (4 mg total) by mouth every 8 (eight) hours as needed for nausea or vomiting.  Dispense: 20 tablet; Refill: 0 - Culture, Urine  3. Shortness of  breath  - EKG 12-Lead - DG Chest 2 View; Future  4. Dysuria  - POC Urinalysis Dipstick - Culture, Urine  5. Lower abdominal pain  - CBC with Differential/Platelet - Comprehensive metabolic panel - POC Urinalysis Dipstick - Culture, Urine  [name], NP

## 2021-01-28 ENCOUNTER — Telehealth: Payer: Self-pay | Admitting: Adult Health

## 2021-01-28 LAB — COMPREHENSIVE METABOLIC PANEL
ALT: 29 U/L (ref 0–35)
AST: 31 U/L (ref 0–37)
Albumin: 4.2 g/dL (ref 3.5–5.2)
Alkaline Phosphatase: 81 U/L (ref 39–117)
BUN: 14 mg/dL (ref 6–23)
CO2: 28 mEq/L (ref 19–32)
Calcium: 9.2 mg/dL (ref 8.4–10.5)
Chloride: 86 mEq/L — ABNORMAL LOW (ref 96–112)
Creatinine, Ser: 0.55 mg/dL (ref 0.40–1.20)
GFR: 91.64 mL/min (ref >60.00)
Glucose, Bld: 126 mg/dL — ABNORMAL HIGH (ref 70–99)
Potassium: 3.7 mEq/L (ref 3.5–5.1)
Sodium: 126 mEq/L — ABNORMAL LOW (ref 135–145)
Total Bilirubin: 0.5 mg/dL (ref 0.2–1.2)
Total Protein: 6.9 g/dL (ref 6.0–8.3)

## 2021-01-28 LAB — CBC WITH DIFFERENTIAL/PLATELET
Basophils Absolute: 0 10*3/uL (ref 0.0–0.1)
Basophils Relative: 0.9 % (ref 0.0–3.0)
Eosinophils Absolute: 0 10*3/uL (ref 0.0–0.7)
Eosinophils Relative: 0 % (ref 0.0–5.0)
HCT: 42.1 % (ref 36.0–46.0)
Hemoglobin: 14.2 g/dL (ref 12.0–15.0)
Lymphocytes Relative: 16.3 % (ref 12.0–46.0)
Lymphs Abs: 0.8 10*3/uL (ref 0.7–4.0)
MCHC: 33.8 g/dL (ref 30.0–36.0)
MCV: 82.3 fl (ref 78.0–100.0)
Monocytes Absolute: 0.2 10*3/uL (ref 0.1–1.0)
Monocytes Relative: 4.6 % (ref 3.0–12.0)
Neutro Abs: 4.1 10*3/uL (ref 1.4–7.7)
Neutrophils Relative %: 78.2 % — ABNORMAL HIGH (ref 43.0–77.0)
Platelets: 241 10*3/uL (ref 150.0–400.0)
RBC: 5.12 Mil/uL — ABNORMAL HIGH (ref 3.87–5.11)
RDW: 13.9 % (ref 11.5–15.5)
WBC: 5.2 10*3/uL (ref 4.0–10.5)

## 2021-01-28 LAB — URINE CULTURE
MICRO NUMBER:: 11603633
SPECIMEN QUALITY:: ADEQUATE

## 2021-01-28 NOTE — Telephone Encounter (Signed)
Updated patient on her labs.  Overall labs are stable except for low sodium level which I believe was from vomiting on the day of getting her lab work done.  She does report that she is "feeling much better".  She will follow-up with symptoms return

## 2021-02-28 ENCOUNTER — Other Ambulatory Visit: Payer: Self-pay | Admitting: Adult Health

## 2021-02-28 DIAGNOSIS — Z760 Encounter for issue of repeat prescription: Secondary | ICD-10-CM

## 2021-02-28 DIAGNOSIS — E785 Hyperlipidemia, unspecified: Secondary | ICD-10-CM

## 2021-05-04 ENCOUNTER — Ambulatory Visit (INDEPENDENT_AMBULATORY_CARE_PROVIDER_SITE_OTHER): Payer: PPO

## 2021-05-04 ENCOUNTER — Telehealth: Payer: Self-pay | Admitting: Adult Health

## 2021-05-04 ENCOUNTER — Other Ambulatory Visit: Payer: Self-pay

## 2021-05-04 VITALS — BP 120/70 | HR 71 | Temp 98.0°F | Ht 60.0 in | Wt 128.0 lb

## 2021-05-04 DIAGNOSIS — Z780 Asymptomatic menopausal state: Secondary | ICD-10-CM | POA: Diagnosis not present

## 2021-05-04 DIAGNOSIS — Z0000 Encounter for general adult medical examination without abnormal findings: Secondary | ICD-10-CM | POA: Diagnosis not present

## 2021-05-04 DIAGNOSIS — Z0100 Encounter for examination of eyes and vision without abnormal findings: Secondary | ICD-10-CM

## 2021-05-04 DIAGNOSIS — Z1231 Encounter for screening mammogram for malignant neoplasm of breast: Secondary | ICD-10-CM

## 2021-05-04 NOTE — Patient Instructions (Signed)
Shelly Clark , Thank you for taking time to come for your Medicare Wellness Visit. I appreciate your ongoing commitment to your health goals. Please review the following plan we discussed and let me know if I can assist you in the future.   Screening recommendations/referrals: Colonoscopy: current 09/02/2016  Due 2027 Mammogram: referral completed 05/04/2021 Bone Density: referral completed 05/04/2021 Recommended yearly ophthalmology/optometry visit for glaucoma screening and checkup Recommended yearly dental visit for hygiene and checkup  Vaccinations: Influenza vaccine: due in fall 2022 Pneumococcal vaccine: completed  Tdap vaccine: current due 2026 Shingles vaccine: completed series     Advanced directives: none   Conditions/risks identified: none   Next appointment: none    Preventive Care 22 Years and Older, Female Preventive care refers to lifestyle choices and visits with your health care provider that can promote health and wellness. What does preventive care include?  A yearly physical exam. This is also called an annual well check.  Dental exams once or twice a year.  Routine eye exams. Ask your health care provider how often you should have your eyes checked.  Personal lifestyle choices, including:  Daily care of your teeth and gums.  Regular physical activity.  Eating a healthy diet.  Avoiding tobacco and drug use.  Limiting alcohol use.  Practicing safe sex.  Taking low-dose aspirin every day.  Taking vitamin and mineral supplements as recommended by your health care provider. What happens during an annual well check? The services and screenings done by your health care provider during your annual well check will depend on your age, overall health, lifestyle risk factors, and family history of disease. Counseling  Your health care provider may ask you questions about your:  Alcohol use.  Tobacco use.  Drug use.  Emotional well-being.  Home and  relationship well-being.  Sexual activity.  Eating habits.  History of falls.  Memory and ability to understand (cognition).  Work and work Statistician.  Reproductive health. Screening  You may have the following tests or measurements:  Height, weight, and BMI.  Blood pressure.  Lipid and cholesterol levels. These may be checked every 5 years, or more frequently if you are over 77 years old.  Skin check.  Lung cancer screening. You may have this screening every year starting at age 30 if you have a 30-pack-year history of smoking and currently smoke or have quit within the past 15 years.  Fecal occult blood test (FOBT) of the stool. You may have this test every year starting at age 42.  Flexible sigmoidoscopy or colonoscopy. You may have a sigmoidoscopy every 5 years or a colonoscopy every 10 years starting at age 60.  Hepatitis C blood test.  Hepatitis B blood test.  Sexually transmitted disease (STD) testing.  Diabetes screening. This is done by checking your blood sugar (glucose) after you have not eaten for a while (fasting). You may have this done every 1-3 years.  Bone density scan. This is done to screen for osteoporosis. You may have this done starting at age 51.  Mammogram. This may be done every 1-2 years. Talk to your health care provider about how often you should have regular mammograms. Talk with your health care provider about your test results, treatment options, and if necessary, the need for more tests. Vaccines  Your health care provider may recommend certain vaccines, such as:  Influenza vaccine. This is recommended every year.  Tetanus, diphtheria, and acellular pertussis (Tdap, Td) vaccine. You may need a Td booster every  10 years.  Zoster vaccine. You may need this after age 5.  Pneumococcal 13-valent conjugate (PCV13) vaccine. One dose is recommended after age 12.  Pneumococcal polysaccharide (PPSV23) vaccine. One dose is recommended after  age 31. Talk to your health care provider about which screenings and vaccines you need and how often you need them. This information is not intended to replace advice given to you by your health care provider. Make sure you discuss any questions you have with your health care provider. Document Released: 12/10/2015 Document Revised: 08/02/2016 Document Reviewed: 09/14/2015 Elsevier Interactive Patient Education  2017 [employer] Prevention in the Home Falls can cause injuries. They can happen to people of all ages. There are many things you can do to make your home safe and to help prevent falls. What can I do on the outside of my home?  Regularly fix the edges of walkways and driveways and fix any cracks.  Remove anything that might make you trip as you walk through a door, such as a raised step or threshold.  Trim any bushes or trees on the path to your home.  Use bright outdoor lighting.  Clear any walking paths of anything that might make someone trip, such as rocks or tools.  Regularly check to see if handrails are loose or broken. Make sure that both sides of any steps have handrails.  Any raised decks and porches should have guardrails on the edges.  Have any leaves, snow, or ice cleared regularly.  Use sand or salt on walking paths during winter.  Clean up any spills in your garage right away. This includes oil or grease spills. What can I do in the bathroom?  Use night lights.  Install grab bars by the toilet and in the tub and shower. Do not use towel bars as grab bars.  Use non-skid mats or decals in the tub or shower.  If you need to sit down in the shower, use a plastic, non-slip stool.  Keep the floor dry. Clean up any water that spills on the floor as soon as it happens.  Remove soap buildup in the tub or shower regularly.  Attach bath mats securely with double-sided non-slip rug tape.  Do not have throw rugs and other things on the floor that can  make you trip. What can I do in the bedroom?  Use night lights.  Make sure that you have a light by your bed that is easy to reach.  Do not use any sheets or blankets that are too big for your bed. They should not hang down onto the floor.  Have a firm chair that has side arms. You can use this for support while you get dressed.  Do not have throw rugs and other things on the floor that can make you trip. What can I do in the kitchen?  Clean up any spills right away.  Avoid walking on wet floors.  Keep items that you use a lot in easy-to-reach places.  If you need to reach something above you, use a strong step stool that has a grab bar.  Keep electrical cords out of the way.  Do not use floor polish or wax that makes floors slippery. If you must use wax, use non-skid floor wax.  Do not have throw rugs and other things on the floor that can make you trip. What can I do with my stairs?  Do not leave any items on the stairs.  Make sure  that there are handrails on both sides of the stairs and use them. Fix handrails that are broken or loose. Make sure that handrails are as long as the stairways.  Check any carpeting to make sure that it is firmly attached to the stairs. Fix any carpet that is loose or worn.  Avoid having throw rugs at the top or bottom of the stairs. If you do have throw rugs, attach them to the floor with carpet tape.  Make sure that you have a light switch at the top of the stairs and the bottom of the stairs. If you do not have them, ask someone to add them for you. What else can I do to help prevent falls?  Wear shoes that:  Do not have high heels.  Have rubber bottoms.  Are comfortable and fit you well.  Are closed at the toe. Do not wear sandals.  If you use a stepladder:  Make sure that it is fully opened. Do not climb a closed stepladder.  Make sure that both sides of the stepladder are locked into place.  Ask someone to hold it for you,  if possible.  Clearly mark and make sure that you can see:  Any grab bars or handrails.  First and last steps.  Where the edge of each step is.  Use tools that help you move around (mobility aids) if they are needed. These include:  Canes.  Walkers.  Scooters.  Crutches.  Turn on the lights when you go into a dark area. Replace any light bulbs as soon as they burn out.  Set up your furniture so you have a clear path. Avoid moving your furniture around.  If any of your floors are uneven, fix them.  If there are any pets around you, be aware of where they are.  Review your medicines with your doctor. Some medicines can make you feel dizzy. This can increase your chance of falling. Ask your doctor what other things that you can do to help prevent falls. This information is not intended to replace advice given to you by your health care provider. Make sure you discuss any questions you have with your health care provider. Document Released: 09/09/2009 Document Revised: 04/20/2016 Document Reviewed: 12/18/2014 Elsevier Interactive Patient Education  2017 [employer].

## 2021-05-04 NOTE — Progress Notes (Signed)
Subjective:   Shelly Clark is a 75 y.o. female who presents for an Initial Medicare Annual Wellness Visit.  Review of Systems    n/a       Objective:    There were no vitals filed for this visit. There is no height or weight on file to calculate BMI.  Advanced Directives 11/11/2020  Does Patient Have a Medical Advance Directive? No  Would patient like information on creating a medical advance directive? No - Patient declined    Current Medications (verified) Outpatient Encounter Medications as of 05/04/2021  Medication Sig  . albuterol (VENTOLIN HFA) 108 (90 Base) MCG/ACT inhaler Inhale 2 puffs into the lungs every 6 (six) hours as needed for wheezing or shortness of breath.  Marland Kitchen amLODipine (NORVASC) 10 MG tablet TAKE 1 TABLET(10 MG) BY MOUTH DAILY  . aspirin EC 81 MG tablet Take 81 mg by mouth daily.  . B Complex-C (B-COMPLEX WITH VITAMIN C) tablet Take 1 tablet by mouth daily.  . budesonide-formoterol (SYMBICORT) 80-4.5 MCG/ACT inhaler Inhale 2 puffs into the lungs in the morning and at bedtime.  . clonazePAM (KLONOPIN) 0.5 MG tablet Take 1 tablet (0.5 mg total) by mouth at bedtime as needed. for sleep  . FLUoxetine (PROZAC) 10 MG capsule TAKE 1 CAPSULE(10 MG) BY MOUTH DAILY (Patient taking differently: Take 10 mg by mouth daily.)  . glipiZIDE (GLUCOTROL XL) 5 MG 24 hr tablet TAKE 1 TABLET(5 MG) BY MOUTH DAILY WITH BREAKFAST (Patient taking differently: Take 5 mg by mouth daily with breakfast.)  . hydrochlorothiazide (MICROZIDE) 12.5 MG capsule TAKE 1 CAPSULE(12.5 MG) BY MOUTH DAILY (Patient taking differently: Take 12.5 mg by mouth daily.)  . lisinopril (ZESTRIL) 40 MG tablet TAKE 1 TABLET(40 MG) BY MOUTH DAILY (Patient taking differently: Take 40 mg by mouth daily.)  . metFORMIN (GLUCOPHAGE) 500 MG tablet TAKE 1 TABLET BY MOUTH TWICE DAILY WITH A MEAL (Patient taking differently: Take 500 mg by mouth 2 (two) times daily with a meal.)  . Multiple Vitamin (MULTIVITAMIN WITH  MINERALS) TABS Take 1 tablet by mouth daily.  . nicotine (NICODERM CQ - DOSED IN MG/24 HOURS) 21 mg/24hr patch Place 1 patch (21 mg total) onto the skin daily.  . ondansetron (ZOFRAN) 4 MG tablet Take 1 tablet (4 mg total) by mouth every 8 (eight) hours as needed for nausea or vomiting.  . pantoprazole (PROTONIX) 20 MG tablet Take 1 tablet (20 mg total) by mouth daily.  . predniSONE (DELTASONE) 10 MG tablet Take 2 tablets (20 mg total) by mouth daily with breakfast.  . simvastatin (ZOCOR) 20 MG tablet TAKE 1 TABLET BY MOUTH EVERY DAY  . triamcinolone ointment (KENALOG) 0.5 % Apply 1 application topically 2 (two) times daily.   No facility-administered encounter medications on file as of 05/04/2021.    Allergies (verified) Codeine   History: Past Medical History:  Diagnosis Date  . Benign hematuria    neg urology workup  . Diabetes mellitus without complication ([place])   . Esophageal dysmotility   . Hyperlipidemia   . Hypertension   . Migraines   . Nicotine dependence, cigarettes, uncomplicated 70/78/6754  . Osteoporosis   . Tobacco abuse    Past Surgical History:  Procedure Laterality Date  . ABDOMINAL HYSTERECTOMY     Family History  Problem Relation Age of Onset  . Ovarian cancer Mother   . Diabetes Mother   . Hypertension Mother   . Hyperlipidemia Mother   . Diabetes Father   . Hypertension Father   .  Hyperlipidemia Father   . Hypertension Brother   . Hypertension Daughter   . Hypertension Daughter    Social History   Socioeconomic History  . Marital status: Married    Spouse name: Not on file  . Number of children: Not on file  . Years of education: Not on file  . Highest education level: Not on file  Occupational History  . 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 Smoking status: Current Every Day Smoker    Packs/day: 1.00    Years: 40.00    Pack years: 40.00    Types: Cigarettes  . Smokeless tobacco: Never Used   Substance and Sexual Activity  . Alcohol use: No  . Drug use: No  . Sexual activity: Not on file  Other Topics Concern  . 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  No  Does the patient have any non-healing wounds?  No  Has the patient had any unintentional weight loss or weight gain?  No   Diabetes:  Is the patient diabetic?  Yes  If diabetic, was a CBG obtained today?  No  Did the patient bring in their glucometer from home?  No  How often do you monitor your CBG's? Never   Financial Strains and Diabetes Management:  Are you having any financial strains with the device, your supplies or your medication? No .  Does the patient want to be seen by Chronic Care Management for management of their diabetes?  No  Would the patient like to be referred to a Nutritionist or for Diabetic Management?  No   Diabetic Exams:  Diabetic Eye Exam: Overdue for diabetic eye exam. Pt has been advised about the importance in completing this exam. Patient advised to call and schedule an eye exam. Diabetic Foot Exam: Overdue, Pt has been advised about the importance in completing this exam. Pt is scheduled for diabetic foot exam on next office visit .         Activities of Daily Living In your present state of health, do you have any difficulty performing the following activities: 11/12/2020  Hearing? N  Vision? N  Difficulty concentrating or making  decisions? N  Walking or climbing stairs? N  Dressing or bathing? N  Doing errands, shopping? N  Some recent data might be hidden    Patient Care Team: [name], NP as PCP - General (Family Medicine)  Indicate any recent Medical Services you may have received from other than [facility] providers in the past year (date may be approximate).     Assessment:   This is a routine wellness examination for Ritha.  Hearing/Vision screen No exam data present  Dietary issues and exercise activities discussed:    Goals Addressed   None  Depression Screen PHQ 2/9 Scores 04/16/2020 10/05/2016 02/24/2015 12/31/2013  PHQ - 2 Score 0 0 0 0    Fall Risk Fall Risk  04/16/2020 10/05/2016 02/24/2015 12/31/2013  Falls in the past year? 0 No No No    FALL RISK PREVENTION PERTAINING TO THE HOME:  Any stairs in or around the home? Yes  If so, are there any without handrails? No  Home free of loose throw rugs in walkways, pet beds, electrical cords, etc? Yes  Adequate lighting in your home to reduce risk of falls? Yes   ASSISTIVE DEVICES UTILIZED TO PREVENT FALLS:  Life alert? No  Use of a cane, walker or w/c? No  Grab bars in the bathroom? No  Shower chair or bench in shower? No  Elevated toilet seat or a handicapped toilet? No   TIMED UP AND GO:  Was the test performed? Yes .  Length of time to ambulate 10 feet: 6  sec.   Gait steady and fast without use of assistive device  Cognitive Function:        Immunizations Immunization History  Administered Date(s) Administered  . Fluad Quad(high Dose 65+) 11/04/2019  . Influenza Whole 09/06/2007, 11/25/2009  . Influenza, High Dose Seasonal PF 10/05/2016, 10/26/2017  . PFIZER(Purple Top)SARS-COV-2 Vaccination 12/31/2019, 01/21/2020  . Pneumococcal Conjugate-13 02/24/2015  . Pneumococcal Polysaccharide-23 10/26/2017  . Td 02/24/2015  . Zoster, Live 01/10/2010    TDAP status: Up to date  Flu Vaccine status: Up to  date  Pneumococcal vaccine status: Up to date  Covid-19 vaccine status: Completed vaccines  Qualifies for Shingles Vaccine? Yes   Zostavax completed No   Shingrix Completed?: No.    Education has been provided regarding the importance of this vaccine. Patient has been advised to call insurance company to determine out of pocket expense if they have not yet received this vaccine. Advised may also receive vaccine at local pharmacy or Health Dept. Verbalized acceptance and understanding.  Screening Tests Health Maintenance  Topic Date Due  . OPHTHALMOLOGY EXAM  Never done  . Zoster Vaccines- Shingrix (1 of 2) Never done  . DEXA SCAN  Never done  . Pneumococcal Vaccine 35-78 Years old (1 of 2 - PPSV23) Never done  . MAMMOGRAM  08/03/2018  . COVID-19 Vaccine (3 - Booster for Pfizer series) 06/19/2020  . FOOT EXAM  04/16/2021  . HEMOGLOBIN A1C  05/13/2021  . INFLUENZA VACCINE  06/27/2021  . TETANUS/TDAP  02/23/2025  . COLONOSCOPY (Pts 45-78yrs Insurance coverage will need to be confirmed)  09/02/2026  . Hepatitis C Screening  Completed  . PNA vac Low Risk Adult  Completed  . HPV VACCINES  Aged Out    Health Maintenance  Health Maintenance Due  Topic Date Due  . OPHTHALMOLOGY EXAM  Never done  . Zoster Vaccines- Shingrix (1 of 2) Never done  . DEXA SCAN  Never done  . Pneumococcal Vaccine 51-26 Years old (1 of 2 - PPSV23) Never done  . MAMMOGRAM  08/03/2018  . COVID-19 Vaccine (3 - Booster for Pfizer series) 06/19/2020  . FOOT EXAM  04/16/2021    Colorectal cancer screening: Type of screening: Colonoscopy. Completed 09/02/2016. Repeat every 10 years  Mammogram status: Ordered 05/04/2021. Pt provided with contact info and advised to call to schedule appt.   Bone Density status: Ordered 05/04/2021. Pt provided with contact info and advised to call to schedule appt.  Lung Cancer Screening: (Low Dose CT Chest recommended if Age 41-80 years, 30 pack-year currently smoking  OR have  quit w/in 15years.) does qualify.   Lung Cancer Screening Referral: n/a  Additional Screening:  Hepatitis C Screening: does not qualify  Vision Screening: Recommended annual ophthalmology exams for early detection of glaucoma and other disorders of the eye. Is the patient up to date with their annual eye exam?  Yes  Who is the provider or what is the name of the office in which the patient attends annual eye exams? Referral completed 05/04/2021 If pt is not established with a provider, would they like to be referred to a provider to establish care? Yes .   Dental Screening: Recommended annual dental exams for proper oral hygiene  Community Resource Referral / Chronic Care Management: CRR required this visit?  No   CCM required this visit?  No      Plan:     I have personally reviewed and noted the following in the patient's chart:   . Medical and social history . Use of alcohol, tobacco or illicit drugs  . Current medications and supplements including opioid prescriptions. Patient is currently taking opioid prescriptions. Information provided to patient regarding non-opioid alternatives. Patient advised to discuss non-opioid treatment plan with their provider. . Functional ability and status . Nutritional status . Physical activity . Advanced directives . List of other physicians . Hospitalizations, surgeries, and ER visits in previous 12 months . Vitals . Screenings to include cognitive, depression, and falls . Referrals and appointments  In addition, I have reviewed and discussed with patient certain preventive protocols, quality metrics, and best practice recommendations. A written personalized care plan for preventive services as well as general preventive health recommendations were provided to patient.     Randel Pigg, LPN   03/02/5034   Nurse Notes: none

## 2021-05-04 NOTE — Telephone Encounter (Signed)
Patient daughter is calling and requesting a refill for triamcinolone ointment (KENALOG) 0.5 % to be sent to   [facility], [address]  [place], [place] [address]  Phone:  [phone] Fax:  [phone]  CB is [phone]

## 2021-05-05 ENCOUNTER — Other Ambulatory Visit: Payer: Self-pay

## 2021-05-05 MED ORDER — TRIAMCINOLONE ACETONIDE 0.5 % EX OINT
1.0000 | TOPICAL_OINTMENT | Freq: Two times a day (BID) | CUTANEOUS | 1 refills | Status: DC
Start: 2021-05-05 — End: 2021-12-07

## 2021-05-05 NOTE — Telephone Encounter (Signed)
Noted.  Rx done.

## 2021-05-06 ENCOUNTER — Other Ambulatory Visit: Payer: Self-pay | Admitting: Adult Health

## 2021-05-06 DIAGNOSIS — I10 Essential (primary) hypertension: Secondary | ICD-10-CM

## 2021-05-06 DIAGNOSIS — E119 Type 2 diabetes mellitus without complications: Secondary | ICD-10-CM

## 2021-05-06 DIAGNOSIS — F4323 Adjustment disorder with mixed anxiety and depressed mood: Secondary | ICD-10-CM

## 2021-06-06 ENCOUNTER — Other Ambulatory Visit: Payer: Self-pay | Admitting: Adult Health

## 2021-06-06 DIAGNOSIS — Z09 Encounter for follow-up examination after completed treatment for conditions other than malignant neoplasm: Secondary | ICD-10-CM

## 2021-06-17 ENCOUNTER — Other Ambulatory Visit: Payer: Self-pay

## 2021-06-17 ENCOUNTER — Encounter: Payer: Self-pay | Admitting: Family Medicine

## 2021-06-17 ENCOUNTER — Ambulatory Visit (INDEPENDENT_AMBULATORY_CARE_PROVIDER_SITE_OTHER): Payer: PPO | Admitting: Family Medicine

## 2021-06-17 VITALS — BP 130/80 | HR 76 | Temp 98.8°F | Resp 16 | Ht 60.0 in | Wt 129.5 lb

## 2021-06-17 DIAGNOSIS — R1030 Lower abdominal pain, unspecified: Secondary | ICD-10-CM

## 2021-06-17 DIAGNOSIS — R102 Pelvic and perineal pain: Secondary | ICD-10-CM

## 2021-06-17 DIAGNOSIS — R809 Proteinuria, unspecified: Secondary | ICD-10-CM

## 2021-06-17 DIAGNOSIS — R350 Frequency of micturition: Secondary | ICD-10-CM

## 2021-06-17 LAB — CBC WITH DIFFERENTIAL/PLATELET
Basophils Absolute: 0.1 10*3/uL (ref 0.0–0.1)
Basophils Relative: 0.8 % (ref 0.0–3.0)
Eosinophils Absolute: 0.1 10*3/uL (ref 0.0–0.7)
Eosinophils Relative: 1.3 % (ref 0.0–5.0)
HCT: 44.5 % (ref 36.0–46.0)
Hemoglobin: 14.7 g/dL (ref 12.0–15.0)
Lymphocytes Relative: 42.3 % (ref 12.0–46.0)
Lymphs Abs: 3 10*3/uL (ref 0.7–4.0)
MCHC: 33.1 g/dL (ref 30.0–36.0)
MCV: 84.8 fl (ref 78.0–100.0)
Monocytes Absolute: 0.4 10*3/uL (ref 0.1–1.0)
Monocytes Relative: 4.9 % (ref 3.0–12.0)
Neutro Abs: 3.6 10*3/uL (ref 1.4–7.7)
Neutrophils Relative %: 50.7 % (ref 43.0–77.0)
Platelets: 276 10*3/uL (ref 150.0–400.0)
RBC: 5.25 Mil/uL — ABNORMAL HIGH (ref 3.87–5.11)
RDW: 14.2 % (ref 11.5–15.5)
WBC: 7.1 10*3/uL (ref 4.0–10.5)

## 2021-06-17 LAB — MICROALBUMIN / CREATININE URINE RATIO
Creatinine,U: 45.5 mg/dL
Microalb Creat Ratio: 133.3 mg/g — ABNORMAL HIGH (ref 0.0–30.0)
Microalb, Ur: 60.6 mg/dL — ABNORMAL HIGH (ref 0.0–1.9)

## 2021-06-17 LAB — POCT URINALYSIS DIPSTICK
Bilirubin, UA: NEGATIVE
Blood, UA: NEGATIVE
Glucose, UA: NEGATIVE
Ketones, UA: NEGATIVE
Leukocytes, UA: NEGATIVE
Nitrite, UA: NEGATIVE
Protein, UA: POSITIVE — AB
Spec Grav, UA: 1.015 (ref 1.010–1.025)
Urobilinogen, UA: 0.2 E.U./dL
pH, UA: 6 (ref 5.0–8.0)

## 2021-06-17 MED ORDER — AMOXICILLIN-POT CLAVULANATE 875-125 MG PO TABS: 1.0000 | ORAL_TABLET | Freq: Two times a day (BID) | ORAL | 0 refills | Status: AC

## 2021-06-17 NOTE — Progress Notes (Addendum)
Acute Office Visit Subjective:    Patient ID: Shelly Clark, female    DOB: October 14, 1946, 75 y.o.   MRN: [id number]  Chief Complaint  Patient presents with   Urinary Tract Infection   HPI Shelly Clark is a 75 year old female with history of hypertension, DM 2, GERD, and anxiety with above concern.  Patient is in today for Urinary frequency, she thinks she may have a UTI.  Patient complains of suprapubic pressure She has had symptoms for 1 day.  She has not identified exacerbating or alleviating factors. Patient denies fever, chills, change in appetite, back pain, congestion, cough, fever, headache, rhinitis, sorethroat, stomach ache, and vaginal discharge.  Patient does have a history of recurrent UTI.   Lower abdominal pain is intermittent, not radiated. No associated melena, nausea, vomiting.  Denies dysuria,gross hematuria,or decreased urine output. She is not sexually active. Last bowel movement yesterday, "normal." She had Ucx on 01/27/21: Mixed genital flora isolated. These superficial bacteria are not indicative of a urinary tract infection. No further organism identification is warranted on this specimen.  She is trying to keep up with hydration. She has not tried OTC medications.  She is very concerned about possible UTI. She had similar symptoms before hospitalization in 12/20221, Dx'ed with sepsis.  Abdominal/pelvic CT on 11/11/21: 1. Negative for acute appendicitis. 2. Urothelial enhancement of the right renal pelvis and diffusely involving the right ureter, suspicious for ascending urinary tract infection. Recommend correlation with urinalysis. 3. Sigmoid colon diverticular disease without acute inflammatory process.  Past Medical History:  Diagnosis Date   Benign hematuria    neg urology workup   Diabetes mellitus without complication ([place])    Esophageal dysmotility    Hyperlipidemia    Hypertension    Migraines    Nicotine dependence, cigarettes,  uncomplicated 123456   Osteoporosis    Tobacco abuse    Past Surgical History:  Procedure Laterality Date   ABDOMINAL HYSTERECTOMY      Family History  Problem Relation Age of Onset   Ovarian cancer Mother    Diabetes Mother    Hypertension Mother    Hyperlipidemia Mother    Diabetes Father    Hypertension Father    Hyperlipidemia Father    Hypertension Brother    Hypertension Daughter    Hypertension Daughter     Social History   Socioeconomic History   Marital status: Married    Spouse name: Not on file   Number of children: Not on file   Years of education: Not on file   Highest education level: Not on file  Occupational History   Occupation: OWNER    Employer: [employer]    Comment: Restaurant  Tobacco Use   Smoking status: Every Day    Packs/day: 1.00    Years: 40.00    Pack years: 40.00    Types: Cigarettes   Smokeless tobacco: Never  Substance and Sexual Activity   Alcohol use: No   Drug use: No   Sexual activity: Not on file  Other Topics Concern   Not on file  Social History Narrative   ** Merged History Encounter **       Owns a home  She is married  Two children      Social Determinants of Radio broadcast assistant Strain: Low Risk    Difficulty of Paying Living Expenses: Not hard at all  Food Insecurity: No Food Insecurity   Worried About Charity fundraiser in the Last Year:  Never true   Ran Out of Food in the Last Year: Never true  Transportation Needs: No Transportation Needs   Lack of Transportation (Medical): No   Lack of Transportation (Non-Medical): No  Physical Activity: Sufficiently Active   Days of Exercise per Week: 5 days   Minutes of Exercise per Session: 60 min  Stress: No Stress Concern Present   Feeling of Stress : Not at all  Social Connections: Socially Integrated   Frequency of Communication with Friends and Family: Three times a week   Frequency of Social Gatherings with Friends and Family: Not on file    Attends Religious Services: More than 4 times per year   Active Member of [employer] or Organizations: Yes   Attends Archivist Meetings: Never   Marital Status: Married  Human resources officer Violence: Not At Risk   Fear of Current or Ex-Partner: No   Emotionally Abused: No   Physically Abused: No   Sexually Abused: No    Outpatient Medications Prior to Visit  Medication Sig Dispense Refill   albuterol (VENTOLIN HFA) 108 (90 Base) MCG/ACT inhaler Inhale 2 puffs into the lungs every 6 (six) hours as needed for wheezing or shortness of breath. 8 g 2   amLODipine (NORVASC) 10 MG tablet TAKE 1 TABLET(10 MG) BY MOUTH DAILY 90 tablet 3   aspirin EC 81 MG tablet Take 81 mg by mouth daily.     B Complex-C (B-COMPLEX WITH VITAMIN C) tablet Take 1 tablet by mouth daily.     budesonide-formoterol (SYMBICORT) 80-4.5 MCG/ACT inhaler Inhale 2 puffs into the lungs in the morning and at bedtime. 1 each 2   clonazePAM (KLONOPIN) 0.5 MG tablet Take 1 tablet (0.5 mg total) by mouth at bedtime as needed. for sleep 30 tablet 2   FLUoxetine (PROZAC) 10 MG capsule TAKE 1 CAPSULE(10 MG) BY MOUTH DAILY 90 capsule 1   glipiZIDE (GLUCOTROL XL) 5 MG 24 hr tablet TAKE 1 TABLET(5 MG) BY MOUTH DAILY WITH BREAKFAST 90 tablet 1   hydrochlorothiazide (MICROZIDE) 12.5 MG capsule TAKE 1 CAPSULE(12.5 MG) BY MOUTH DAILY 90 capsule 1   lisinopril (ZESTRIL) 40 MG tablet TAKE 1 TABLET(40 MG) BY MOUTH DAILY (Patient taking differently: Take 40 mg by mouth daily.) 90 tablet 2   metFORMIN (GLUCOPHAGE) 500 MG tablet TAKE 1 TABLET BY MOUTH TWICE DAILY WITH A MEAL 180 tablet 1   Multiple Vitamin (MULTIVITAMIN WITH MINERALS) TABS Take 1 tablet by mouth daily.     nicotine (NICODERM CQ - DOSED IN MG/24 HOURS) 21 mg/24hr patch Place 1 patch (21 mg total) onto the skin daily. 28 patch 0   ondansetron (ZOFRAN) 4 MG tablet Take 1 tablet (4 mg total) by mouth every 8 (eight) hours as needed for nausea or vomiting. 20 tablet 0   pantoprazole  (PROTONIX) 20 MG tablet Take 1 tablet (20 mg total) by mouth daily. 30 tablet 0   predniSONE (DELTASONE) 10 MG tablet Take 2 tablets (20 mg total) by mouth daily with breakfast. 7 tablet 0   simvastatin (ZOCOR) 20 MG tablet TAKE 1 TABLET BY MOUTH EVERY DAY 90 tablet 0   triamcinolone ointment (KENALOG) 0.5 % Apply 1 application topically 2 (two) times daily. 30 g 1   No facility-administered medications prior to visit.    Allergies  Allergen Reactions   Codeine Nausea And Vomiting   Review of Systems  Constitutional:  Negative for appetite change and fatigue.  Respiratory:  Negative for shortness of breath and wheezing.  Cardiovascular:  Negative for chest pain, palpitations and leg swelling.  Gastrointestinal:  Negative for abdominal distention and blood in stool.  Musculoskeletal:  Negative for gait problem and myalgias.  Skin:  Negative for rash.  Allergic/Immunologic: Negative for food allergies.  Neurological:  Negative for syncope and weakness.  Psychiatric/Behavioral:  Negative for confusion. The patient is nervous/anxious.   Rest of negative or positive ROS refer to HPI.    Objective:   BP 130/80   Pulse 76   Temp 98.8 F (37.1 C) (Oral)   Resp 16   Ht 5' (1.524 m)   Wt 129 lb 8 oz (58.7 kg)   SpO2 98%   BMI 25.29 kg/m   Physical Exam Vitals and nursing note reviewed.  Constitutional:      General: She is not in acute distress.    Appearance: She is well-developed. She is not ill-appearing.  HENT:     Head: Normocephalic and atraumatic.     Mouth/Throat:     Mouth: Mucous membranes are moist.     Pharynx: Oropharynx is clear.  Eyes:     Conjunctiva/sclera: Conjunctivae normal.  Cardiovascular:     Rate and Rhythm: Normal rate and regular rhythm.     Heart sounds: No murmur heard. Pulmonary:     Effort: Pulmonary effort is normal. No respiratory distress.     Breath sounds: Normal breath sounds.  Abdominal:     General: Bowel sounds are normal. There is  no distension.     Palpations: Abdomen is soft. There is no hepatomegaly or mass.     Tenderness: There is abdominal tenderness in the suprapubic area and left lower quadrant. There is no right CVA tenderness, left CVA tenderness, guarding or rebound.  Lymphadenopathy:     Cervical: No cervical adenopathy.  Skin:    General: Skin is warm.     Findings: No erythema or rash.  Neurological:     Mental Status: She is alert and oriented to person, place, and time.  Psychiatric:        Mood and Affect: Mood is anxious.     Comments: Well groomed, good eye contact.      Assessment & Plan:   Problem List Items Addressed This Visit   None Visit Diagnoses    Orders Placed This Encounter  Procedures   Culture, Urine   Microalbumin/Creatinine Ratio, Urine   CBC with Differential/Platelet   POCT urinalysis dipstick   Lab Results  Component Value Date   WBC 7.1 06/17/2021   HGB 14.7 06/17/2021   HCT 44.5 06/17/2021   MCV 84.8 06/17/2021   PLT 276.0 06/17/2021    Lower abdominal pain    -  Primary We discussed possible etiologies. ?  Acute diverticulitis. Because of age and risk for complications, empiric antibiotic treatment started today with Augmentin. We discussed some side effects of medications. Adequate fiber and fluid intake. I do not think imaging is needed at this time. Clearly instructed about warning signs.    Relevant Orders   POCT urinalysis dipstick (Completed)   Culture, Urine   CBC with Differential/Platelet   Proteinuria, unspecified type     2+ protein seen in urine dipstick. Further recommendation will be given according to lab results.    Relevant Orders      Microalbumin/Creatinine Ratio, Urine   Urinary frequency     We discussed possible etiologies. Urine dipstick negative for leukocytes,blood,and nitrite. Empiric antibiotic treatment started today. We will follow urine culture and tailor treatment  if needed. Adequate hydration. Monitor for new  symptoms.    Relevant Orders   POCT urinalysis dipstick (Completed)   Culture, Urine   Meds ordered this encounter  Medications   amoxicillin-clavulanate (AUGMENTIN) 875-125 MG tablet    Sig: Take 1 tablet by mouth 2 (two) times daily for 7 days.    Dispense:  14 tablet    Refill:  0   Return in about 1 week (around 06/24/2021) for PCP.  Veera Stapleton G. [place], MD  [facility]. [place] office.

## 2021-06-17 NOTE — Addendum Note (Signed)
Addended by: Randel Pigg on: 06/17/2021 08:58 AM   Modules accepted: Orders, Level of Service, SmartSet

## 2021-06-17 NOTE — Patient Instructions (Addendum)
A few things to remember from today's visit:   Lower abdominal pain - Plan: POCT urinalysis dipstick, Culture, Urine, Culture, Urine, CBC with Differential/Platelet  Proteinuria, unspecified type - Plan: Culture, Urine, Microalbumin/Creatinine Ratio, Urine, Microalbumin/Creatinine Ratio, Urine, Culture, Urine  Urinary frequency - Plan: POCT urinalysis dipstick, Culture, Urine, Culture, Urine  If you need refills please call your pharmacy. Do not use My Chart to request refills or for acute issues that need immediate attention.   Because possible diverticulitis and risk of serious infection antibiotic sent to your pharmacy. Monitor for fever and new symptoms, in which case you will need to go to the ER.  Please be sure medication list is accurate. If a new problem present, please set up appointment sooner than planned today.

## 2021-06-18 LAB — URINE CULTURE
MICRO NUMBER:: 12152295
Result:: NO GROWTH
SPECIMEN QUALITY:: ADEQUATE

## 2021-06-28 ENCOUNTER — Ambulatory Visit (INDEPENDENT_AMBULATORY_CARE_PROVIDER_SITE_OTHER): Payer: PPO | Admitting: Adult Health

## 2021-06-28 ENCOUNTER — Encounter: Payer: Self-pay | Admitting: Adult Health

## 2021-06-28 ENCOUNTER — Other Ambulatory Visit: Payer: Self-pay

## 2021-06-28 VITALS — BP 130/84 | HR 92 | Temp 98.1°F | Ht 60.0 in | Wt 130.0 lb

## 2021-06-28 DIAGNOSIS — F4323 Adjustment disorder with mixed anxiety and depressed mood: Secondary | ICD-10-CM | POA: Diagnosis not present

## 2021-06-28 DIAGNOSIS — E119 Type 2 diabetes mellitus without complications: Secondary | ICD-10-CM

## 2021-06-28 DIAGNOSIS — I10 Essential (primary) hypertension: Secondary | ICD-10-CM

## 2021-06-28 LAB — POCT GLYCOSYLATED HEMOGLOBIN (HGB A1C): Hemoglobin A1C: 6.3 % — AB (ref 4.0–5.6)

## 2021-06-28 MED ORDER — FLUOXETINE HCL 20 MG PO TABS: 20.0000 mg | ORAL_TABLET | Freq: Every day | ORAL | 1 refills | Status: DC

## 2021-06-28 NOTE — Patient Instructions (Addendum)
It was great seeing you today   I am going to increase your Prozac to 20 mg daily. You can take two tabs of what you have at home.

## 2021-06-28 NOTE — Progress Notes (Signed)
Subjective:    Patient ID: Shelly Clark, female    DOB: 1946-07-26, 75 y.o.   MRN: [id number]  HPI 75 year old female who  has a past medical history of Benign hematuria, Diabetes mellitus without complication ([place]), Esophageal dysmotility, Hyperlipidemia, Hypertension, Migraines, Nicotine dependence, cigarettes, uncomplicated (123456), Osteoporosis, and Tobacco abuse.  She presents to the office today for follow-up regarding diabetes, hypertension, and anxiety  DM -she is currently prescribed glipizide 5 mg extended release daily and metformin 500 mg twice daily.  She does not check her blood sugars at home but denies symptoms of hypoglycemia.  She has been trying to work using the amount of carbs in her diet.  Diabetes has been well controlled for some time on this medication combination.  Last A1c roughly 6 months ago was 6.1  HTN -prescribe Norvasc 10 mg daily, HCTZ 12.5 mg, and lisinopril 40 mg daily.  She denies dizziness, lightheadedness, chest pain, or shortness of breath  Anxiety -since her hospital admission in December 2021 for sepsis related to an acute UTI she reports that her anxiety has been elevated.  She feels as though and a little feeling of abdominal pain or back pain causes her to go into a mild panic attack because she is afraid that she has another urinary tract infection which will lead to her becoming septic again..  She is currently prescribed Prozac 10 mg as daily maintenance therapy and would like to increase this dose   Review of Systems See HPI   Past Medical History:  Diagnosis Date   Benign hematuria    neg urology workup   Diabetes mellitus without complication ([place])    Esophageal dysmotility    Hyperlipidemia    Hypertension    Migraines    Nicotine dependence, cigarettes, uncomplicated 123456   Osteoporosis    Tobacco abuse     Social History   Socioeconomic History   Marital status: Married    Spouse name: Not on file   Number of  children: Not on file   Years of education: Not on file   Highest education level: Not on file  Occupational History   Occupation: OWNER    Employer: [employer]    Comment: Restaurant  Tobacco Use   Smoking status: Every Day    Packs/day: 1.00    Years: 40.00    Pack years: 40.00    Types: Cigarettes   Smokeless tobacco: Never  Substance and Sexual Activity   Alcohol use: No   Drug use: No   Sexual activity: Not on file  Other Topics Concern   Not on file  Social History Narrative   ** Merged History Encounter **       Owns a home  She is married  Two children      Social Determinants of Radio broadcast assistant Strain: Low Risk    Difficulty of Paying Living Expenses: Not hard at all  Food Insecurity: No Food Insecurity   Worried About Charity fundraiser in the Last Year: Never true   Arboriculturist in the Last Year: Never true  Transportation Needs: No Transportation Needs   Lack of Transportation (Medical): No   Lack of Transportation (Non-Medical): No  Physical Activity: Sufficiently Active   Days of Exercise per Week: 5 days   Minutes of Exercise per Session: 60 min  Stress: No Stress Concern Present   Feeling of Stress : Not at all  Social Connections: Socially Integrated  Frequency of Communication with Friends and Family: Three times a week   Frequency of Social Gatherings with Friends and Family: Not on file   Attends Religious Services: More than 4 times per year   Active Member of [employer] or Organizations: Yes   Attends Archivist Meetings: Never   Marital Status: Married  Human resources officer Violence: Not At Risk   Fear of Current or Ex-Partner: No   Emotionally Abused: No   Physically Abused: No   Sexually Abused: No    Past Surgical History:  Procedure Laterality Date   ABDOMINAL HYSTERECTOMY      Family History  Problem Relation Age of Onset   Ovarian cancer Mother    Diabetes Mother    Hypertension Mother    Hyperlipidemia  Mother    Diabetes Father    Hypertension Father    Hyperlipidemia Father    Hypertension Brother    Hypertension Daughter    Hypertension Daughter     Allergies  Allergen Reactions   Codeine Nausea And Vomiting    Current Outpatient Medications on File Prior to Visit  Medication Sig Dispense Refill   albuterol (VENTOLIN HFA) 108 (90 Base) MCG/ACT inhaler Inhale 2 puffs into the lungs every 6 (six) hours as needed for wheezing or shortness of breath. 8 g 2   amLODipine (NORVASC) 10 MG tablet TAKE 1 TABLET(10 MG) BY MOUTH DAILY 90 tablet 3   aspirin EC 81 MG tablet Take 81 mg by mouth daily.     B Complex-C (B-COMPLEX WITH VITAMIN C) tablet Take 1 tablet by mouth daily.     budesonide-formoterol (SYMBICORT) 80-4.5 MCG/ACT inhaler Inhale 2 puffs into the lungs in the morning and at bedtime. 1 each 2   clonazePAM (KLONOPIN) 0.5 MG tablet Take 1 tablet (0.5 mg total) by mouth at bedtime as needed. for sleep 30 tablet 2   glipiZIDE (GLUCOTROL XL) 5 MG 24 hr tablet TAKE 1 TABLET(5 MG) BY MOUTH DAILY WITH BREAKFAST 90 tablet 1   hydrochlorothiazide (MICROZIDE) 12.5 MG capsule TAKE 1 CAPSULE(12.5 MG) BY MOUTH DAILY 90 capsule 1   lisinopril (ZESTRIL) 40 MG tablet TAKE 1 TABLET(40 MG) BY MOUTH DAILY (Patient taking differently: Take 40 mg by mouth daily.) 90 tablet 2   metFORMIN (GLUCOPHAGE) 500 MG tablet TAKE 1 TABLET BY MOUTH TWICE DAILY WITH A MEAL 180 tablet 1   Multiple Vitamin (MULTIVITAMIN WITH MINERALS) TABS Take 1 tablet by mouth daily.     nicotine (NICODERM CQ - DOSED IN MG/24 HOURS) 21 mg/24hr patch Place 1 patch (21 mg total) onto the skin daily. 28 patch 0   pantoprazole (PROTONIX) 20 MG tablet Take 1 tablet (20 mg total) by mouth daily. 30 tablet 0   simvastatin (ZOCOR) 20 MG tablet TAKE 1 TABLET BY MOUTH EVERY DAY 90 tablet 0   triamcinolone ointment (KENALOG) 0.5 % Apply 1 application topically 2 (two) times daily. 30 g 1   No current facility-administered medications on file  prior to visit.    BP 130/84   Pulse 92   Temp 98.1 F (36.7 C) (Oral)   Ht 5' (1.524 m)   Wt 130 lb (59 kg)   SpO2 97%   BMI 25.39 kg/m       Objective:   Physical Exam Vitals and nursing note reviewed.  Constitutional:      Appearance: Normal appearance.  Cardiovascular:     Rate and Rhythm: Normal rate and regular rhythm.     Pulses: Normal pulses.  Heart sounds: Normal heart sounds.  Pulmonary:     Effort: Pulmonary effort is normal.     Breath sounds: Normal breath sounds.  Abdominal:     General: Abdomen is flat.     Palpations: Abdomen is soft.  Musculoskeletal:        General: Normal range of motion.  Skin:    General: Skin is warm and dry.  Neurological:     General: No focal deficit present.     Mental Status: She is alert and oriented to person, place, and time.  Psychiatric:        Mood and Affect: Mood normal.        Behavior: Behavior normal.        Thought Content: Thought content normal.        Judgment: Judgment normal.      Assessment & Plan:  1. Controlled type 2 diabetes mellitus without complication, without long-term current use of insulin (HCC)  - POC HgB A1c- 6.3  - No change in medication - at goal   2. Adjustment disorder with mixed anxiety and depressed mood - Will increase Prozac to 20 mg. She will follow up in one month if anxiety is not better controlled.  - FLUoxetine (PROZAC) 20 MG tablet; Take 1 tablet (20 mg total) by mouth daily.  Dispense: 90 tablet; Refill: 1  3. Essential hypertension - Well controlled. No change in medication   [name], NP

## 2021-07-28 ENCOUNTER — Ambulatory Visit
Admission: RE | Admit: 2021-07-28 | Discharge: 2021-07-28 | Disposition: A | Discharge status: Home / Self Care | Payer: PPO | Source: Ambulatory Visit | Attending: Adult Health | Admitting: Adult Health

## 2021-07-28 ENCOUNTER — Other Ambulatory Visit: Payer: Self-pay

## 2021-07-28 DIAGNOSIS — Z09 Encounter for follow-up examination after completed treatment for conditions other than malignant neoplasm: Secondary | ICD-10-CM

## 2021-07-28 DIAGNOSIS — N6489 Other specified disorders of breast: Secondary | ICD-10-CM | POA: Diagnosis not present

## 2021-07-28 DIAGNOSIS — R922 Inconclusive mammogram: Secondary | ICD-10-CM | POA: Diagnosis not present

## 2021-08-26 ENCOUNTER — Ambulatory Visit: Payer: PPO | Admitting: Adult Health

## 2021-08-30 ENCOUNTER — Encounter: Payer: Self-pay | Admitting: Adult Health

## 2021-08-30 ENCOUNTER — Ambulatory Visit (INDEPENDENT_AMBULATORY_CARE_PROVIDER_SITE_OTHER): Payer: PPO | Admitting: Adult Health

## 2021-08-30 ENCOUNTER — Other Ambulatory Visit: Payer: Self-pay

## 2021-08-30 VITALS — BP 140/80 | HR 93 | Temp 99.0°F | Ht 60.0 in | Wt 128.0 lb

## 2021-08-30 DIAGNOSIS — I10 Essential (primary) hypertension: Secondary | ICD-10-CM | POA: Diagnosis not present

## 2021-08-30 DIAGNOSIS — E119 Type 2 diabetes mellitus without complications: Secondary | ICD-10-CM | POA: Diagnosis not present

## 2021-08-30 DIAGNOSIS — F17200 Nicotine dependence, unspecified, uncomplicated: Secondary | ICD-10-CM | POA: Diagnosis not present

## 2021-08-30 DIAGNOSIS — G4700 Insomnia, unspecified: Secondary | ICD-10-CM | POA: Diagnosis not present

## 2021-08-30 DIAGNOSIS — E785 Hyperlipidemia, unspecified: Secondary | ICD-10-CM

## 2021-08-30 MED ORDER — SIMVASTATIN 20 MG PO TABS: 20.0000 mg | ORAL_TABLET | Freq: Every day | ORAL | 3 refills | Status: DC

## 2021-08-30 MED ORDER — LISINOPRIL 40 MG PO TABS: 40.0000 mg | ORAL_TABLET | Freq: Every day | ORAL | 3 refills | Status: DC

## 2021-08-30 MED ORDER — METFORMIN HCL 500 MG PO TABS: 500.0000 mg | ORAL_TABLET | Freq: Two times a day (BID) | ORAL | 1 refills | Status: DC

## 2021-08-30 MED ORDER — CLONAZEPAM 0.5 MG PO TABS: 0.5000 mg | ORAL_TABLET | Freq: Every evening | ORAL | 2 refills | Status: DC | PRN

## 2021-08-30 MED ORDER — HYDROCHLOROTHIAZIDE 25 MG PO TABS: 25.0000 mg | ORAL_TABLET | Freq: Every day | ORAL | 3 refills | Status: DC

## 2021-08-30 NOTE — Patient Instructions (Signed)
I am going to increase your HCTZ from 12.5 mg to 25 mg to see if this helps with your blood pressure readings  Please follow up in one month for your physical

## 2021-08-30 NOTE — Progress Notes (Signed)
Subjective:    Patient ID: Shelly Clark, female    DOB: 11-May-1946, 75 y.o.   MRN: [id number]  HPI  75 year old female who  has a past medical history of Benign hematuria, Diabetes mellitus without complication ([place]), Esophageal dysmotility, Hyperlipidemia, Hypertension, Migraines, Nicotine dependence, cigarettes, uncomplicated (32/67/1245), Osteoporosis, and Tobacco abuse.  She presents to the office today for follow up regarding hypertension. She has been checking her blood pressure at home and reports elevated readings into the 140-150's at home. She is asymptomatic.   She is currently maintained on Norvasc 10 mg , lisinopril 40 mg and HCTZ 12.5 mg   She is back to smoking and her diet does include a lot of sodium   She also needs refills on medications   BP Readings from Last 3 Encounters:  08/30/21 140/80  06/28/21 130/84  06/17/21 130/80   Review of Systems See HPI   Past Medical History:  Diagnosis Date   Benign hematuria    neg urology workup   Diabetes mellitus without complication ([place])    Esophageal dysmotility    Hyperlipidemia    Hypertension    Migraines    Nicotine dependence, cigarettes, uncomplicated 80/99/8338   Osteoporosis    Tobacco abuse     Social History   Socioeconomic History   Marital status: Married    Spouse name: Not on file   Number of children: Not on file   Years of education: Not on file   Highest education level: Not on file  Occupational History   Occupation: OWNER    Employer: [employer]    Comment: Restaurant  Tobacco Use   Smoking status: Every Day    Packs/day: 1.00    Years: 40.00    Pack years: 40.00    Types: Cigarettes   Smokeless tobacco: Never  Substance and Sexual Activity   Alcohol use: No   Drug use: No   Sexual activity: Not on file  Other Topics Concern   Not on file  Social History Narrative   ** Merged History Encounter **       Owns a home  She is married  Two children      Social  Determinants of Radio broadcast assistant Strain: Low Risk    Difficulty of Paying Living Expenses: Not hard at all  Food Insecurity: No Food Insecurity   Worried About Charity fundraiser in the Last Year: Never true   Arboriculturist in the Last Year: Never true  Transportation Needs: No Transportation Needs   Lack of Transportation (Medical): No   Lack of Transportation (Non-Medical): No  Physical Activity: Sufficiently Active   Days of Exercise per Week: 5 days   Minutes of Exercise per Session: 60 min  Stress: No Stress Concern Present   Feeling of Stress : Not at all  Social Connections: Socially Integrated   Frequency of Communication with Friends and Family: Three times a week   Frequency of Social Gatherings with Friends and Family: Not on file   Attends Religious Services: More than 4 times per year   Active Member of [employer] or Organizations: Yes   Attends Archivist Meetings: Never   Marital Status: Married  Human resources officer Violence: Not At Risk   Fear of Current or Ex-Partner: No   Emotionally Abused: No   Physically Abused: No   Sexually Abused: No    Past Surgical History:  Procedure Laterality Date   ABDOMINAL HYSTERECTOMY  Family History  Problem Relation Age of Onset   Ovarian cancer Mother    Diabetes Mother    Hypertension Mother    Hyperlipidemia Mother    Diabetes Father    Hypertension Father    Hyperlipidemia Father    Hypertension Brother    Hypertension Daughter    Hypertension Daughter     Allergies  Allergen Reactions   Codeine Nausea And Vomiting    Current Outpatient Medications on File Prior to Visit  Medication Sig Dispense Refill   albuterol (VENTOLIN HFA) 108 (90 Base) MCG/ACT inhaler Inhale 2 puffs into the lungs every 6 (six) hours as needed for wheezing or shortness of breath. 8 g 2   amLODipine (NORVASC) 10 MG tablet TAKE 1 TABLET(10 MG) BY MOUTH DAILY 90 tablet 3   aspirin EC 81 MG tablet Take 81 mg by  mouth daily.     B Complex-C (B-COMPLEX WITH VITAMIN C) tablet Take 1 tablet by mouth daily.     budesonide-formoterol (SYMBICORT) 80-4.5 MCG/ACT inhaler Inhale 2 puffs into the lungs in the morning and at bedtime. 1 each 2   clonazePAM (KLONOPIN) 0.5 MG tablet Take 1 tablet (0.5 mg total) by mouth at bedtime as needed. for sleep 30 tablet 2   FLUoxetine (PROZAC) 20 MG tablet Take 1 tablet (20 mg total) by mouth daily. 90 tablet 1   glipiZIDE (GLUCOTROL XL) 5 MG 24 hr tablet TAKE 1 TABLET(5 MG) BY MOUTH DAILY WITH BREAKFAST 90 tablet 1   lisinopril (ZESTRIL) 40 MG tablet TAKE 1 TABLET(40 MG) BY MOUTH DAILY (Patient taking differently: Take 40 mg by mouth daily.) 90 tablet 2   metFORMIN (GLUCOPHAGE) 500 MG tablet TAKE 1 TABLET BY MOUTH TWICE DAILY WITH A MEAL 180 tablet 1   Multiple Vitamin (MULTIVITAMIN WITH MINERALS) TABS Take 1 tablet by mouth daily.     nicotine (NICODERM CQ - DOSED IN MG/24 HOURS) 21 mg/24hr patch Place 1 patch (21 mg total) onto the skin daily. 28 patch 0   pantoprazole (PROTONIX) 20 MG tablet Take 1 tablet (20 mg total) by mouth daily. 30 tablet 0   simvastatin (ZOCOR) 20 MG tablet TAKE 1 TABLET BY MOUTH EVERY DAY 90 tablet 0   triamcinolone ointment (KENALOG) 0.5 % Apply 1 application topically 2 (two) times daily. 30 g 1   No current facility-administered medications on file prior to visit.    BP 140/80   Pulse 93   Temp 99 F (37.2 C) (Oral)   Ht 5' (1.524 m)   Wt 128 lb (58.1 kg)   SpO2 96%   BMI 25.00 kg/m       Objective:   Physical Exam Vitals and nursing note reviewed.  Constitutional:      Appearance: Normal appearance.  Cardiovascular:     Rate and Rhythm: Normal rate and regular rhythm.     Pulses: Normal pulses.     Heart sounds: Normal heart sounds.  Pulmonary:     Effort: Pulmonary effort is normal.     Breath sounds: Normal breath sounds.  Musculoskeletal:        General: Normal range of motion.  Skin:    General: Skin is warm and dry.      Capillary Refill: Capillary refill takes less than 2 seconds.  Neurological:     General: No focal deficit present.     Mental Status: She is alert and oriented to person, place, and time.  Psychiatric:        Mood  and Affect: Mood normal.        Behavior: Behavior normal.        Thought Content: Thought content normal.        Judgment: Judgment normal.      Assessment & Plan:  1. Essential hypertension - Will increase HCTZ from 12.5 mg to 25 mg  - Follow up in one month for CPE  - hydrochlorothiazide (HYDRODIURIL) 25 MG tablet; Take 1 tablet (25 mg total) by mouth daily.  Dispense: 90 tablet; Refill: 3 - lisinopril (ZESTRIL) 40 MG tablet; Take 1 tablet (40 mg total) by mouth daily.  Dispense: 90 tablet; Refill: 3  2. TOBACCO ABUSE - Encouraged to quit smoking   3. Hyperlipidemia, unspecified hyperlipidemia type  - simvastatin (ZOCOR) 20 MG tablet; Take 1 tablet (20 mg total) by mouth daily.  Dispense: 90 tablet; Refill: 3  4. Controlled type 2 diabetes mellitus without complication, without long-term current use of insulin ([facility])  - metFORMIN (GLUCOPHAGE) 500 MG tablet; Take 1 tablet (500 mg total) by mouth 2 (two) times daily with a meal.  Dispense: 180 tablet; Refill: 1  5. Insomnia, unspecified type  - clonazePAM (KLONOPIN) 0.5 MG tablet; Take 1 tablet (0.5 mg total) by mouth at bedtime as needed. for sleep  Dispense: 30 tablet; Refill: 2  [name], NP

## 2021-10-05 ENCOUNTER — Ambulatory Visit (INDEPENDENT_AMBULATORY_CARE_PROVIDER_SITE_OTHER): Payer: PPO | Admitting: Adult Health

## 2021-10-05 ENCOUNTER — Encounter: Payer: Self-pay | Admitting: Adult Health

## 2021-10-05 ENCOUNTER — Other Ambulatory Visit: Payer: Self-pay

## 2021-10-05 VITALS — BP 130/78 | HR 84 | Temp 98.7°F | Ht 59.0 in | Wt 130.0 lb

## 2021-10-05 DIAGNOSIS — Z23 Encounter for immunization: Secondary | ICD-10-CM | POA: Diagnosis not present

## 2021-10-05 DIAGNOSIS — Z0000 Encounter for general adult medical examination without abnormal findings: Secondary | ICD-10-CM

## 2021-10-05 DIAGNOSIS — E782 Mixed hyperlipidemia: Secondary | ICD-10-CM

## 2021-10-05 DIAGNOSIS — F17200 Nicotine dependence, unspecified, uncomplicated: Secondary | ICD-10-CM

## 2021-10-05 DIAGNOSIS — E119 Type 2 diabetes mellitus without complications: Secondary | ICD-10-CM | POA: Diagnosis not present

## 2021-10-05 DIAGNOSIS — I10 Essential (primary) hypertension: Secondary | ICD-10-CM | POA: Diagnosis not present

## 2021-10-05 DIAGNOSIS — F4323 Adjustment disorder with mixed anxiety and depressed mood: Secondary | ICD-10-CM

## 2021-10-05 LAB — COMPREHENSIVE METABOLIC PANEL
ALT: 42 U/L — ABNORMAL HIGH (ref 0–35)
AST: 37 U/L (ref 0–37)
Albumin: 4.9 g/dL (ref 3.5–5.2)
Alkaline Phosphatase: 72 U/L (ref 39–117)
BUN: 26 mg/dL — ABNORMAL HIGH (ref 6–23)
CO2: 28 mEq/L (ref 19–32)
Calcium: 10.4 mg/dL (ref 8.4–10.5)
Chloride: 99 mEq/L (ref 96–112)
Creatinine, Ser: 0.68 mg/dL (ref 0.40–1.20)
GFR: 86.65 mL/min (ref >60.00)
Glucose, Bld: 97 mg/dL (ref 70–99)
Potassium: 4.1 mEq/L (ref 3.5–5.1)
Sodium: 135 mEq/L (ref 135–145)
Total Bilirubin: 0.7 mg/dL (ref 0.2–1.2)
Total Protein: 8 g/dL (ref 6.0–8.3)

## 2021-10-05 LAB — LIPID PANEL
Cholesterol: 187 mg/dL (ref 0–200)
HDL: 63.9 mg/dL (ref >39.00)
LDL Cholesterol: 90 mg/dL (ref 0–99)
NonHDL: 123.11
Total CHOL/HDL Ratio: 3
Triglycerides: 166 mg/dL — ABNORMAL HIGH (ref 0.0–149.0)
VLDL: 33.2 mg/dL (ref 0.0–40.0)

## 2021-10-05 LAB — CBC WITH DIFFERENTIAL/PLATELET
Basophils Absolute: 0.1 10*3/uL (ref 0.0–0.1)
Basophils Relative: 0.9 % (ref 0.0–3.0)
Eosinophils Absolute: 0.1 10*3/uL (ref 0.0–0.7)
Eosinophils Relative: 1.5 % (ref 0.0–5.0)
HCT: 45.1 % (ref 36.0–46.0)
Hemoglobin: 14.9 g/dL (ref 12.0–15.0)
Lymphocytes Relative: 41.5 % (ref 12.0–46.0)
Lymphs Abs: 2.6 10*3/uL (ref 0.7–4.0)
MCHC: 32.9 g/dL (ref 30.0–36.0)
MCV: 86.3 fl (ref 78.0–100.0)
Monocytes Absolute: 0.3 10*3/uL (ref 0.1–1.0)
Monocytes Relative: 5.2 % (ref 3.0–12.0)
Neutro Abs: 3.2 10*3/uL (ref 1.4–7.7)
Neutrophils Relative %: 50.9 % (ref 43.0–77.0)
Platelets: 276 10*3/uL (ref 150.0–400.0)
RBC: 5.23 Mil/uL — ABNORMAL HIGH (ref 3.87–5.11)
RDW: 14.3 % (ref 11.5–15.5)
WBC: 6.3 10*3/uL (ref 4.0–10.5)

## 2021-10-05 LAB — HEMOGLOBIN A1C: Hgb A1c MFr Bld: 6.7 % — ABNORMAL HIGH (ref 4.6–6.5)

## 2021-10-05 LAB — TSH: TSH: 2.55 u[IU]/mL (ref 0.35–5.50)

## 2021-10-05 NOTE — Progress Notes (Signed)
Subjective:    Patient ID: Shelly Clark, female    DOB: [birth year], 75 y.o.   MRN: [id number]  HPI  Patient presents for yearly preventative medicine examination. She is a pleasant 75 year old female who  has a past medical history of Benign hematuria, Diabetes mellitus without complication ([place]), Esophageal dysmotility, Hyperlipidemia, Hypertension, Migraines, Nicotine dependence, cigarettes, uncomplicated (36/46/8032), Osteoporosis, and Tobacco abuse.  Diabetes mellitus type 2-currently prescribed glipizide 5 mg extended release daily and metformin 500 mg twice daily.  She does not check her blood sugars at home but denies symptoms of hypoglycemia.  She has been trying to work on diet and reduce the amount of carbs daily.  Lab Results  Component Value Date   HGBA1C 6.3 (A) 06/28/2021   Hypertension-prescribed Norvasc 10 mg daily, HCTZ 12.5 mg and lisinopril 40 mg daily.  She denies dizziness, lightheadedness, chest pain, or shortness of breath  BP Readings from Last 3 Encounters:  10/05/21 130/78  08/30/21 140/80  06/28/21 130/84   Anxiety - doing well with Prozac 20 mg daily and takes Klonopin 0.5 mg nightly as needed  Hyperlipidemia-prescribed Zocor 20 mg daily.  She denies algia or fatigue  Lab Results  Component Value Date   CHOL 203 (H) 04/16/2020   HDL 57.30 04/16/2020   LDLCALC 153 (H) 10/26/2017   LDLDIRECT 91.0 04/16/2020   TRIG 206.0 (H) 04/16/2020   CHOLHDL 4 04/16/2020   Tobacco Use - continues to smoke about a half pack. She is working on quitting. Using the patch.   All immunizations and health maintenance protocols were reviewed with the patient and needed orders were placed.  Appropriate screening laboratory values were ordered for the patient including screening of hyperlipidemia, renal function and hepatic function.   Medication reconciliation,  past medical history, social history, problem list and allergies were reviewed in detail with the  patient  Goals were established with regard to weight loss, exercise, and  diet in compliance with medications  She is up-to-date on routine breast cancer screening as well as colon cancer screening.  She has no acute complaints today   Review of Systems  Constitutional: Negative.   HENT: Negative.    Eyes: Negative.   Respiratory: Negative.    Cardiovascular: Negative.   Gastrointestinal: Negative.   Endocrine: Negative.   Genitourinary: Negative.   Musculoskeletal: Negative.   Skin: Negative.   Allergic/Immunologic: Negative.   Neurological: Negative.   Hematological: Negative.   Psychiatric/Behavioral: Negative.     Past Medical History:  Diagnosis Date   Benign hematuria    neg urology workup   Diabetes mellitus without complication ([place])    Esophageal dysmotility    Hyperlipidemia    Hypertension    Migraines    Nicotine dependence, cigarettes, uncomplicated 11/19/8249   Osteoporosis    Tobacco abuse     Social History   Socioeconomic History   Marital status: Married    Spouse name: Not on file   Number of children: Not on file   Years of education: Not on file   Highest education level: Not on file  Occupational History   Occupation: OWNER    Employer: [employer]    Comment: Restaurant  Tobacco Use   Smoking status: Every Day    Packs/day: 1.00    Years: 40.00    Pack years: 40.00    Types: Cigarettes   Smokeless tobacco: Never  Substance and Sexual Activity   Alcohol use: No   Drug use: No  Sexual activity: Not on file  Other Topics Concern   Not on file  Social History Narrative   ** Merged History Encounter **       Owns a home  She is married  Two children      Social Determinants of Radio broadcast assistant Strain: Low Risk    Difficulty of Paying Living Expenses: Not hard at all  Food Insecurity: No Food Insecurity   Worried About Charity fundraiser in the Last Year: Never true   Arboriculturist in the Last Year: Never true   Transportation Needs: No Transportation Needs   Lack of Transportation (Medical): No   Lack of Transportation (Non-Medical): No  Physical Activity: Sufficiently Active   Days of Exercise per Week: 5 days   Minutes of Exercise per Session: 60 min  Stress: No Stress Concern Present   Feeling of Stress : Not at all  Social Connections: Socially Integrated   Frequency of Communication with Friends and Family: Three times a week   Frequency of Social Gatherings with Friends and Family: Not on file   Attends Religious Services: More than 4 times per year   Active Member of [employer] or Organizations: Yes   Attends Archivist Meetings: Never   Marital Status: Married  Human resources officer Violence: Not At Risk   Fear of Current or Ex-Partner: No   Emotionally Abused: No   Physically Abused: No   Sexually Abused: No    Past Surgical History:  Procedure Laterality Date   ABDOMINAL HYSTERECTOMY      Family History  Problem Relation Age of Onset   Ovarian cancer Mother    Diabetes Mother    Hypertension Mother    Hyperlipidemia Mother    Diabetes Father    Hypertension Father    Hyperlipidemia Father    Hypertension Brother    Hypertension Daughter    Hypertension Daughter     Allergies  Allergen Reactions   Codeine Nausea And Vomiting    Current Outpatient Medications on File Prior to Visit  Medication Sig Dispense Refill   albuterol (VENTOLIN HFA) 108 (90 Base) MCG/ACT inhaler Inhale 2 puffs into the lungs every 6 (six) hours as needed for wheezing or shortness of breath. 8 g 2   amLODipine (NORVASC) 10 MG tablet TAKE 1 TABLET(10 MG) BY MOUTH DAILY 90 tablet 3   aspirin EC 81 MG tablet Take 81 mg by mouth daily.     B Complex-C (B-COMPLEX WITH VITAMIN C) tablet Take 1 tablet by mouth daily.     budesonide-formoterol (SYMBICORT) 80-4.5 MCG/ACT inhaler Inhale 2 puffs into the lungs in the morning and at bedtime. 1 each 2   clonazePAM (KLONOPIN) 0.5 MG tablet Take 1  tablet (0.5 mg total) by mouth at bedtime as needed. for sleep 30 tablet 2   FLUoxetine (PROZAC) 20 MG tablet Take 1 tablet (20 mg total) by mouth daily. 90 tablet 1   glipiZIDE (GLUCOTROL XL) 5 MG 24 hr tablet TAKE 1 TABLET(5 MG) BY MOUTH DAILY WITH BREAKFAST 90 tablet 1   hydrochlorothiazide (HYDRODIURIL) 25 MG tablet Take 1 tablet (25 mg total) by mouth daily. 90 tablet 3   lisinopril (ZESTRIL) 40 MG tablet Take 1 tablet (40 mg total) by mouth daily. 90 tablet 3   metFORMIN (GLUCOPHAGE) 500 MG tablet Take 1 tablet (500 mg total) by mouth 2 (two) times daily with a meal. 180 tablet 1   Multiple Vitamin (MULTIVITAMIN WITH MINERALS) TABS  Take 1 tablet by mouth daily.     nicotine (NICODERM CQ - DOSED IN MG/24 HOURS) 21 mg/24hr patch Place 1 patch (21 mg total) onto the skin daily. 28 patch 0   pantoprazole (PROTONIX) 20 MG tablet Take 1 tablet (20 mg total) by mouth daily. 30 tablet 0   simvastatin (ZOCOR) 20 MG tablet Take 1 tablet (20 mg total) by mouth daily. 90 tablet 3   triamcinolone ointment (KENALOG) 0.5 % Apply 1 application topically 2 (two) times daily. 30 g 1   No current facility-administered medications on file prior to visit.    BP 130/78   Pulse 84   Temp 98.7 F (37.1 C) (Oral)   Ht 4\' 11"  (1.499 m)   Wt 130 lb (59 kg)   SpO2 97%   BMI 26.26 kg/m        Objective:   Physical Exam Vitals and nursing note reviewed.  Constitutional:      General: She is not in acute distress.    Appearance: Normal appearance. She is well-developed. She is not ill-appearing.  HENT:     Head: Normocephalic and atraumatic.     Right Ear: Tympanic membrane, ear canal and external ear normal. There is no impacted cerumen.     Left Ear: Tympanic membrane, ear canal and external ear normal. There is no impacted cerumen.     Nose: Nose normal. No congestion or rhinorrhea.     Mouth/Throat:     Mouth: Mucous membranes are moist.     Pharynx: Oropharynx is clear. No oropharyngeal exudate  or posterior oropharyngeal erythema.  Eyes:     General:        Right eye: No discharge.        Left eye: No discharge.     Extraocular Movements: Extraocular movements intact.     Conjunctiva/sclera: Conjunctivae normal.     Pupils: Pupils are equal, round, and reactive to light.  Neck:     Thyroid: No thyromegaly.     Vascular: No carotid bruit.     Trachea: No tracheal deviation.  Cardiovascular:     Rate and Rhythm: Normal rate and regular rhythm.     Pulses: Normal pulses.     Heart sounds: Normal heart sounds. No murmur heard.   No friction rub. No gallop.  Pulmonary:     Effort: Pulmonary effort is normal. No respiratory distress.     Breath sounds: Normal breath sounds. No stridor. No wheezing, rhonchi or rales.  Chest:     Chest wall: No tenderness.  Abdominal:     General: Abdomen is flat. Bowel sounds are normal. There is no distension.     Palpations: Abdomen is soft. There is no mass.     Tenderness: There is no abdominal tenderness. There is no right CVA tenderness, left CVA tenderness, guarding or rebound.     Hernia: No hernia is present.  Musculoskeletal:        General: No swelling, tenderness, deformity or signs of injury. Normal range of motion.     Cervical back: Normal range of motion and neck supple.     Right lower leg: No edema.     Left lower leg: No edema.  Lymphadenopathy:     Cervical: No cervical adenopathy.  Skin:    General: Skin is warm and dry.     Coloration: Skin is not jaundiced or pale.     Findings: No bruising, erythema, lesion or rash.  Neurological:  General: No focal deficit present.     Mental Status: She is alert and oriented to person, place, and time.     Cranial Nerves: No cranial nerve deficit.     Sensory: No sensory deficit.     Motor: No weakness.     Coordination: Coordination normal.     Gait: Gait normal.     Deep Tendon Reflexes: Reflexes normal.  Psychiatric:        Mood and Affect: Mood normal.         Behavior: Behavior normal.        Thought Content: Thought content normal.        Judgment: Judgment normal.      Assessment & Plan:  1. Routine general medical examination at a health care facility - Follow up in one year or sooner if needed - Continue to work on lifestyle modifications  - Follow up in one year or sooner if needed - CBC with Differential/Platelet; Future - Comprehensive metabolic panel; Future - Hemoglobin A1c; Future - Lipid panel; Future - TSH; Future  2. Mixed hyperlipidemia - Consider increase in statin  - CBC with Differential/Platelet; Future - Comprehensive metabolic panel; Future - Hemoglobin A1c; Future - Lipid panel; Future - TSH; Future  3. Essential hypertension - Well controlled. No change in medications  - CBC with Differential/Platelet; Future - Comprehensive metabolic panel; Future - Hemoglobin A1c; Future - Lipid panel; Future - TSH; Future  4. TOBACCO ABUSE - Encouraged to quit smoking  - CBC with Differential/Platelet; Future - Comprehensive metabolic panel; Future - Hemoglobin A1c; Future - Lipid panel; Future - TSH; Future  5. Controlled type 2 diabetes mellitus without complication, without long-term current use of insulin ([place]) - Consider increase in Metformin  - Likely follow up in 6 months  - CBC with Differential/Platelet; Future - Comprehensive metabolic panel; Future - Hemoglobin A1c; Future - Lipid panel; Future - TSH; Future  6. Adjustment disorder with mixed anxiety and depressed mood - Continue Prozac 20 mg and Ativan PRN  - CBC with Differential/Platelet; Future - Comprehensive metabolic panel; Future - Hemoglobin A1c; Future - Lipid panel; Future - TSH; Future   7. Need for immunization against influenza  - Flu Vaccine QUAD High Dose(Fluad)  [name], NP

## 2021-10-05 NOTE — Patient Instructions (Signed)
It was great seeing you today   We will follow up with you regarding your blood work   I will see you back in 6 months or sooner if needed

## 2021-11-19 ENCOUNTER — Other Ambulatory Visit: Payer: Self-pay

## 2021-11-19 ENCOUNTER — Emergency Department (INDEPENDENT_AMBULATORY_CARE_PROVIDER_SITE_OTHER)
Admission: RE | Admit: 2021-11-19 | Discharge: 2021-11-19 | Disposition: A | Discharge status: Home / Self Care | Payer: PPO | Source: Ambulatory Visit | Attending: Family Medicine | Admitting: Family Medicine

## 2021-11-19 VITALS — BP 143/76 | HR 69 | Temp 97.8°F | Resp 18 | Ht 59.0 in | Wt 130.0 lb

## 2021-11-19 DIAGNOSIS — N10 Acute tubulo-interstitial nephritis: Secondary | ICD-10-CM

## 2021-11-19 LAB — POCT URINALYSIS DIP (MANUAL ENTRY)
Bilirubin, UA: NEGATIVE
Blood, UA: NEGATIVE
Glucose, UA: NEGATIVE mg/dL
Ketones, POC UA: NEGATIVE mg/dL
Nitrite, UA: NEGATIVE
Protein Ur, POC: 30 mg/dL — AB
Spec Grav, UA: 1.02 (ref 1.010–1.025)
Urobilinogen, UA: 0.2 E.U./dL
pH, UA: 6.5 (ref 5.0–8.0)

## 2021-11-19 MED ORDER — CIPROFLOXACIN HCL 500 MG PO TABS: 500.0000 mg | ORAL_TABLET | Freq: Two times a day (BID) | ORAL | 0 refills | Status: DC

## 2021-11-19 MED ORDER — CEFTRIAXONE SODIUM 1 G IJ SOLR
1000.0000 mg | Freq: Once | INTRAMUSCULAR | Status: AC
  Administered 2021-11-19: 12:00:00 1000 mg via INTRAMUSCULAR

## 2021-11-19 NOTE — ED Triage Notes (Signed)
Pt states that she has some abdominal pain, pain with urination and back pain. X2 days  Pt states that has a history of uti's and was hospitalized in the past.

## 2021-11-19 NOTE — ED Provider Notes (Signed)
[facility]    CSN: [id number] Arrival date & time: 11/19/21  0931      History   Chief Complaint Chief Complaint  Patient presents with   Abdominal Pain    Abdominal pain, pain with urination and back pain. X2 days    HPI Shelly Clark is a 75 y.o. female.   HPI  Patient is here for UTI.  She is having normal appetite.  No fever.  Back ache.  Urinary frequency and burning.  This is been going on for 2 days.  Last year when she had similar symptoms she will let the symptoms go and ended up with pyelonephritis and sepsis, was hospitalized.  The hospital records and culture reports are reviewed  Past Medical History:  Diagnosis Date   Benign hematuria    neg urology workup   Diabetes mellitus without complication ([place])    Esophageal dysmotility    Hyperlipidemia    Hypertension    Migraines    Nicotine dependence, cigarettes, uncomplicated 62/22/9798   Osteoporosis    Tobacco abuse     Patient Active Problem List   Diagnosis Date Noted   E coli bacteremia 11/13/2020   Hypokalemia    Wheezing on expiration    Dehydration with hyponatremia 11/12/2020   Mixed hyperlipidemia due to type 2 diabetes mellitus ([place]) 11/12/2020   GERD without esophagitis 11/12/2020   Nicotine dependence, cigarettes, uncomplicated 92/09/9416   Hypokalemia, inadequate intake 11/12/2020   E. coli UTI 11/12/2020   Sepsis due to gram-negative UTI ([place]) 11/11/2020   Dysphagia 02/24/2015   Adjustment disorder with mixed anxiety and depressed mood 02/10/2013   Hyperlipidemia 10/30/2007   TOBACCO ABUSE 10/30/2007   Essential hypertension 10/30/2007   UNSPECIFIED OSTEOPOROSIS 10/30/2007   Type 2 diabetes mellitus, controlled ([place]) 10/30/2007    Past Surgical History:  Procedure Laterality Date   ABDOMINAL HYSTERECTOMY     MOUTH SURGERY      OB History   No obstetric history on file.      Home Medications    Prior to Admission medications   Medication Sig Start Date  End Date Taking? Authorizing Provider  albuterol (VENTOLIN HFA) 108 (90 Base) MCG/ACT inhaler Inhale 2 puffs into the lungs every 6 (six) hours as needed for wheezing or shortness of breath. 11/14/20  Yes Eugenie Filler, MD  amLODipine (NORVASC) 10 MG tablet TAKE 1 TABLET(10 MG) BY MOUTH DAILY 05/06/21  Yes Nafziger, Tommi Rumps, NP  aspirin EC 81 MG tablet Take 81 mg by mouth daily.   Yes [provider]  B Complex-C (B-COMPLEX WITH VITAMIN C) tablet Take 1 tablet by mouth daily.   Yes [provider]  budesonide-formoterol (SYMBICORT) 80-4.5 MCG/ACT inhaler Inhale 2 puffs into the lungs in the morning and at bedtime. 11/14/20  Yes Eugenie Filler, MD  ciprofloxacin (CIPRO) 500 MG tablet Take 1 tablet (500 mg total) by mouth 2 (two) times daily. 11/19/21  Yes Raylene Everts, MD  clonazePAM (KLONOPIN) 0.5 MG tablet Take 1 tablet (0.5 mg total) by mouth at bedtime as needed. for sleep 08/30/21  Yes Nafziger, Tommi Rumps, NP  FLUoxetine (PROZAC) 20 MG tablet Take 1 tablet (20 mg total) by mouth daily. 06/28/21  Yes Nafziger, Tommi Rumps, NP  glipiZIDE (GLUCOTROL XL) 5 MG 24 hr tablet TAKE 1 TABLET(5 MG) BY MOUTH DAILY WITH BREAKFAST 05/06/21  Yes Nafziger, Tommi Rumps, NP  hydrochlorothiazide (HYDRODIURIL) 25 MG tablet Take 1 tablet (25 mg total) by mouth daily. 08/30/21  Yes Nafziger, Tommi Rumps,  NP  lisinopril (ZESTRIL) 40 MG tablet Take 1 tablet (40 mg total) by mouth daily. 08/30/21  Yes Nafziger, Tommi Rumps, NP  metFORMIN (GLUCOPHAGE) 500 MG tablet Take 1 tablet (500 mg total) by mouth 2 (two) times daily with a meal. 08/30/21  Yes Nafziger, Tommi Rumps, NP  Multiple Vitamin (MULTIVITAMIN WITH MINERALS) TABS Take 1 tablet by mouth daily.   Yes [provider]  nicotine (NICODERM CQ - DOSED IN MG/24 HOURS) 21 mg/24hr patch Place 1 patch (21 mg total) onto the skin daily. 11/15/20  Yes Eugenie Filler, MD  pantoprazole (PROTONIX) 20 MG tablet Take 1 tablet (20 mg total) by mouth daily. 11/08/20  Yes Wardell Honour, MD  simvastatin (ZOCOR) 20 MG tablet Take 1 tablet (20 mg total) by mouth daily. 08/30/21  Yes Nafziger, Tommi Rumps, NP  triamcinolone ointment (KENALOG) 0.5 % Apply 1 application topically 2 (two) times daily. 05/05/21  Yes Nafziger, Tommi Rumps, NP    Family History Family History  Problem Relation Age of Onset   Ovarian cancer Mother    Diabetes Mother    Hypertension Mother    Hyperlipidemia Mother    Diabetes Father    Hypertension Father    Hyperlipidemia Father    Hypertension Brother    Hypertension Daughter    Hypertension Daughter     Social History Social History   Tobacco Use   Smoking status: Every Day    Packs/day: 1.00    Years: 40.00    Pack years: 40.00    Types: Cigarettes   Smokeless tobacco: Never  Substance Use Topics   Alcohol use: No   Drug use: No     Allergies   Codeine   Review of Systems Review of Systems See HPI  Physical Exam Triage Vital Signs ED Triage Vitals  Enc Vitals Group     BP 11/19/21 1049 (!) 143/76     Pulse Rate 11/19/21 1049 69     Resp 11/19/21 1049 18     Temp 11/19/21 1049 97.8 F (36.6 C)     Temp Source 11/19/21 1049 Oral     SpO2 11/19/21 1049 95 %     Weight 11/19/21 1047 130 lb (59 kg)     Height 11/19/21 1047 4\' 11"  (1.499 m)     Head Circumference --      Peak Flow --      Pain Score 11/19/21 1046 4     Pain Loc --      Pain Edu? --      Excl. in [place]? --    No data found.  Updated Vital Signs BP (!) 143/76 (BP Location: Right Arm)    Pulse 69    Temp 97.8 F (36.6 C) (Oral)    Resp 18    Ht 4\' 11"  (1.499 m)    Wt 59 kg    SpO2 95%    BMI 26.26 kg/m      Physical Exam Vitals and nursing note reviewed.  Constitutional:      General: She is not in acute distress.    Appearance: She is well-developed.  HENT:     Head: Normocephalic and atraumatic.     Mouth/Throat:     Comments: Mask is in place Eyes:     Conjunctiva/sclera: Conjunctivae normal.     Pupils: Pupils are equal, round, and  reactive to light.  Cardiovascular:     Rate and Rhythm: Normal rate and regular rhythm.     Heart sounds: Normal  heart sounds.  Pulmonary:     Effort: Pulmonary effort is normal. No respiratory distress.     Breath sounds: Normal breath sounds.  Chest:     Chest wall: No tenderness.  Abdominal:     General: Bowel sounds are normal. There is no distension.     Palpations: Abdomen is soft.     Tenderness: There is abdominal tenderness. There is left CVA tenderness.     Comments: Very mild left CVA tenderness.  There is tenderness to deep palpation of the left mid abdomen over the area of the kidney.  No guarding or rebound  Musculoskeletal:        General: Normal range of motion.     Cervical back: Normal range of motion.  Skin:    General: Skin is warm and dry.  Neurological:     Mental Status: She is alert.  Psychiatric:        Mood and Affect: Mood normal.        Behavior: Behavior normal.     UC Treatments / Results  Labs (all labs ordered are listed, but only abnormal results are displayed) Labs Reviewed  POCT URINALYSIS DIP (MANUAL ENTRY) - Abnormal; Notable for the following components:      Result Value   Protein Ur, POC =30 (*)    Leukocytes, UA Small (1+) (*)    All other components within normal limits  URINE CULTURE    EKG   Radiology No results found.  Procedures Procedures (including critical care time)  Medications Ordered in UC Medications  cefTRIAXone (ROCEPHIN) injection 1,000 mg (1,000 mg Intramuscular Given 11/19/21 1142)    Initial Impression / Assessment and Plan / UC Course  I have reviewed the triage vital signs and the nursing notes.  Pertinent labs & imaging results that were available during my care of the patient were reviewed by me and considered in my medical decision making (see chart for details).     Patient has a history of urinary sepsis 1 year ago.  Currently has similar symptoms.  I have concern with urinary symptoms and  tenderness over her kidney that she could have an early pyelonephritis.  I am going to treat her with a gram of Rocephin and 7 days of Cipro.  For up with primary care Final Clinical Impressions(s) / UC Diagnoses   Final diagnoses:  Acute pyelonephritis     Discharge Instructions      Drink lots of water Take the antibiotic 2 times a day for 5 days May take Tylenol or ibuprofen for pain and fever Go to the ER promptly if you become worse instead of better at any time Your urine culture will be available in 2 to 3 days.  You will be called if any change in antibiotics is needed    ED Prescriptions     Medication Sig Dispense Auth. Provider   ciprofloxacin (CIPRO) 500 MG tablet Take 1 tablet (500 mg total) by mouth 2 (two) times daily. 14 tablet Raylene Everts, MD      PDMP not reviewed this encounter.   Raylene Everts, MD 11/19/21 [phone]

## 2021-11-19 NOTE — Discharge Instructions (Addendum)
Drink lots of water Take the antibiotic 2 times a day for 7 days May take Tylenol or ibuprofen for pain and fever Go to the ER promptly if you become worse instead of better at any time Your urine culture will be available in 2 to 3 days.  You will be called if any change in antibiotics is needed

## 2021-11-24 LAB — URINE CULTURE

## 2021-11-26 ENCOUNTER — Other Ambulatory Visit: Payer: Self-pay | Admitting: Adult Health

## 2021-11-26 DIAGNOSIS — I10 Essential (primary) hypertension: Secondary | ICD-10-CM

## 2021-11-26 DIAGNOSIS — F4323 Adjustment disorder with mixed anxiety and depressed mood: Secondary | ICD-10-CM

## 2021-11-26 DIAGNOSIS — E119 Type 2 diabetes mellitus without complications: Secondary | ICD-10-CM

## 2021-12-02 ENCOUNTER — Other Ambulatory Visit: Payer: Self-pay | Admitting: Adult Health

## 2021-12-29 ENCOUNTER — Other Ambulatory Visit: Payer: Self-pay | Admitting: Adult Health

## 2021-12-29 DIAGNOSIS — F4323 Adjustment disorder with mixed anxiety and depressed mood: Secondary | ICD-10-CM

## 2022-01-07 IMAGING — US US BREAST*L* LIMITED INC AXILLA
1 series · 4 of 4 positions shown · non-contrast
Comparison: Previous exam(s).

CLINICAL DATA: Patient for delayed follow-up of probably benign
right breast asymmetry.

EXAM:
DIGITAL DIAGNOSTIC BILATERAL MAMMOGRAM WITH TOMOSYNTHESIS AND CAD;
ULTRASOUND LEFT BREAST LIMITED
TECHNIQUE: Bilateral digital diagnostic mammography and breast tomosynthesis
was performed. The images were evaluated with computer-aided
detection.; Targeted ultrasound examination of the left breast was
performed.

[Series 1: us breast*left* limited inc axilla · 0.07mm/px · 4 of 4 slices shown]
[im 1/4]
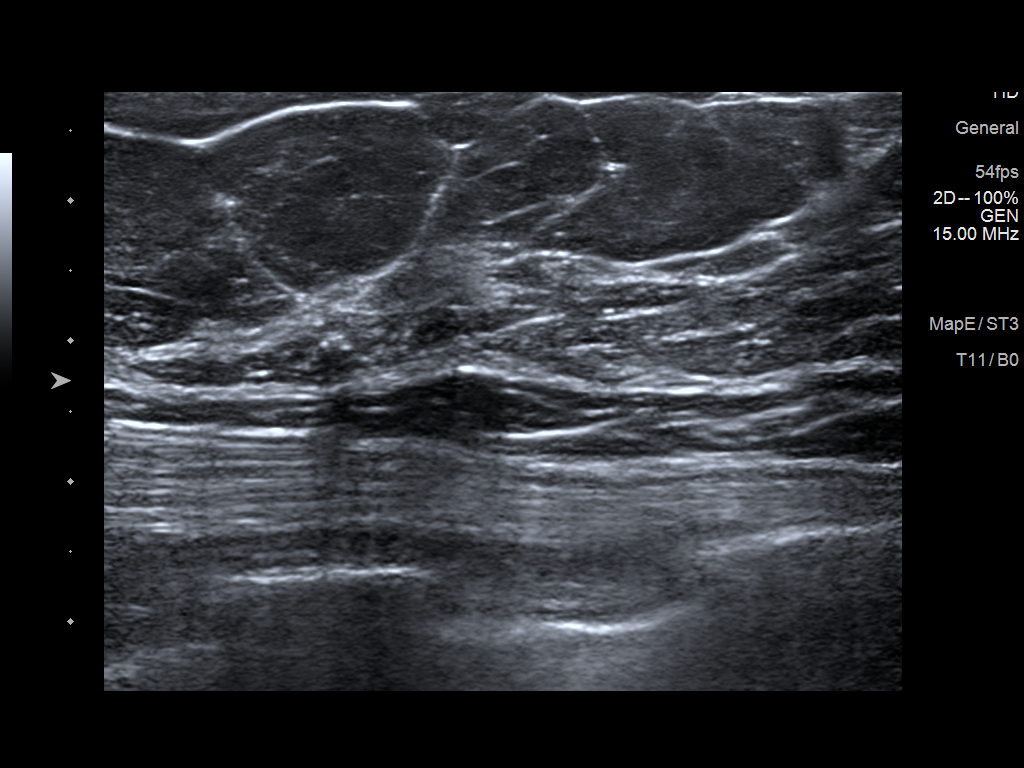
[im 2/4]
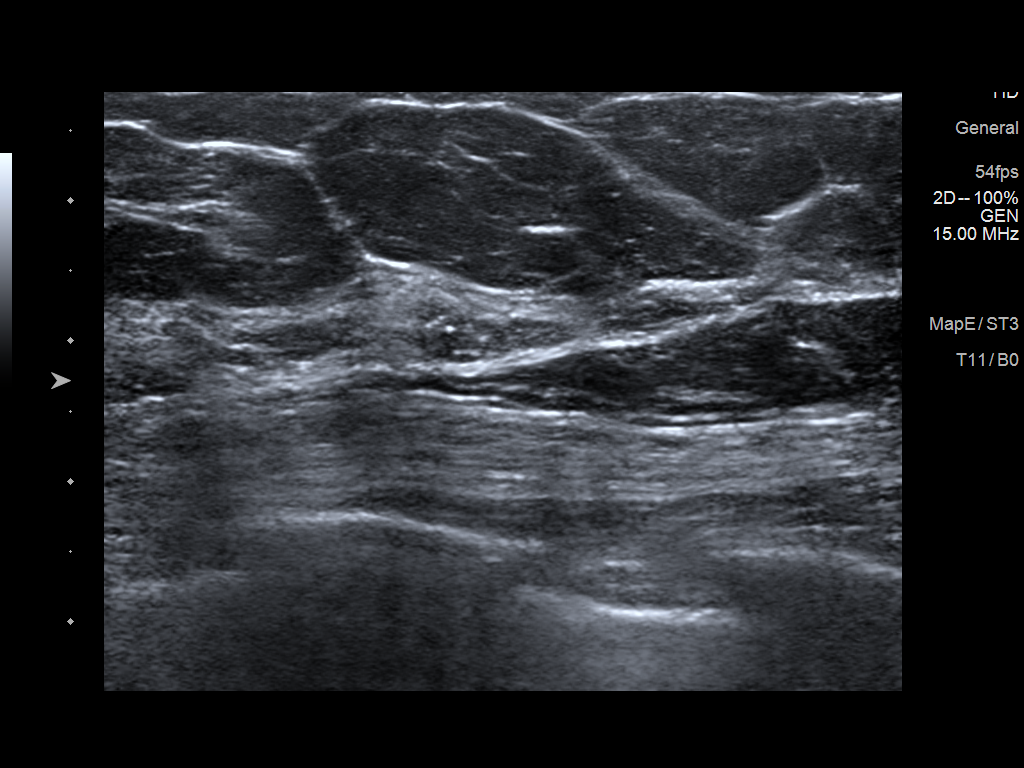
[im 3/4]
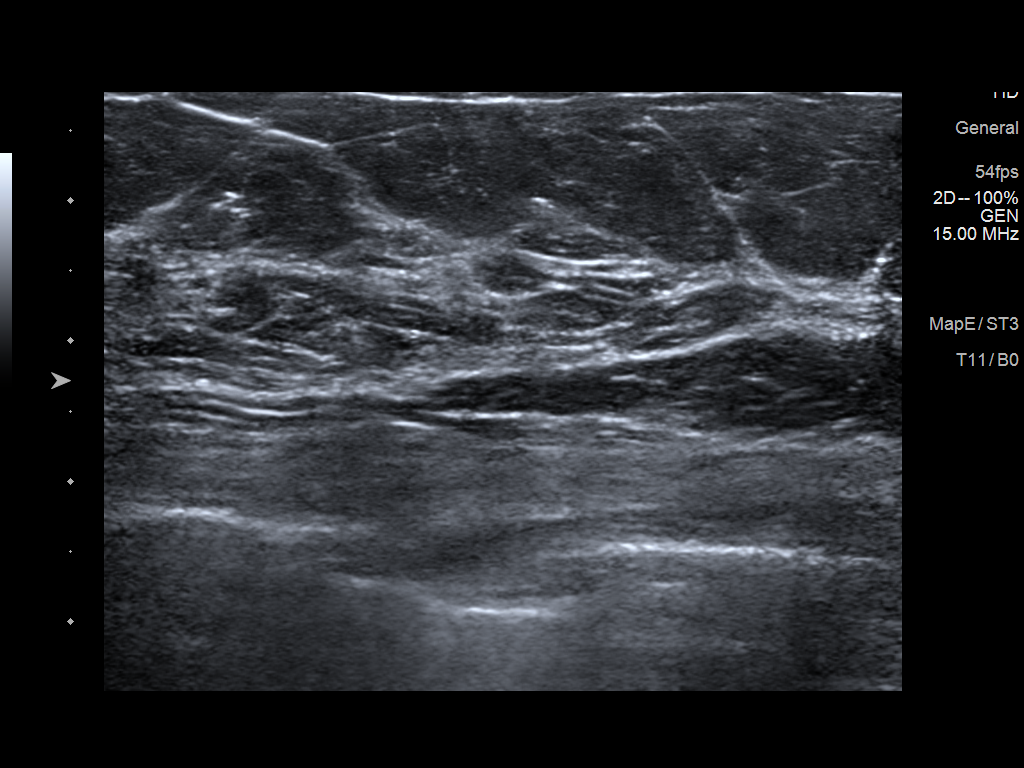
[im 4/4]
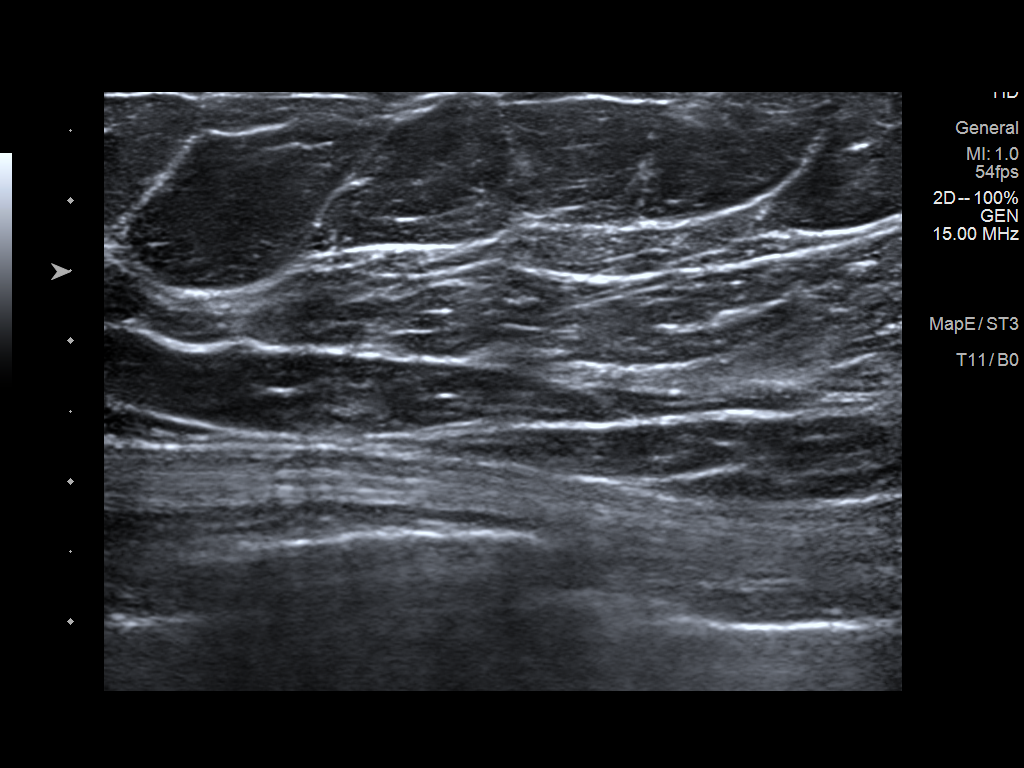

[4 of 4 positions shown; findings below may reference images not displayed]

ACR Breast Density Category c: The breast tissue is heterogeneously
dense, which may obscure small masses.
FINDINGS: Grossly unchanged asymmetry within the right breast. Within the left
breast central depth superiorly there is dense fibroglandular tissue
which predominately effaces with additional imaging. No additional
findings.

Targeted ultrasound is performed, showing normal tissue without
suspicious mass within the left breast 12 o'clock position.
IMPRESSION: No mammographic evidence for malignancy.

RECOMMENDATION:
Screening mammogram in one year.(Code:[id number])

I have discussed the findings and recommendations with the patient.
If applicable, a reminder letter will be sent to the patient
regarding the next appointment.

BI-RADS CATEGORY  1: Negative.

## 2022-01-31 ENCOUNTER — Encounter: Payer: Self-pay | Admitting: Adult Health

## 2022-01-31 ENCOUNTER — Ambulatory Visit (INDEPENDENT_AMBULATORY_CARE_PROVIDER_SITE_OTHER): Payer: PPO | Admitting: Adult Health

## 2022-01-31 VITALS — BP 130/80 | HR 86 | Temp 98.9°F | Ht 59.0 in | Wt 131.0 lb

## 2022-01-31 DIAGNOSIS — F4323 Adjustment disorder with mixed anxiety and depressed mood: Secondary | ICD-10-CM

## 2022-01-31 DIAGNOSIS — L720 Epidermal cyst: Secondary | ICD-10-CM | POA: Diagnosis not present

## 2022-01-31 DIAGNOSIS — G4700 Insomnia, unspecified: Secondary | ICD-10-CM

## 2022-01-31 MED ORDER — CLONAZEPAM 0.5 MG PO TABS: 0.5000 mg | ORAL_TABLET | Freq: Every evening | ORAL | 2 refills | Status: DC | PRN

## 2022-01-31 MED ORDER — FLUOXETINE HCL 20 MG PO TABS: 20.0000 mg | ORAL_TABLET | Freq: Every day | ORAL | 1 refills | Status: DC

## 2022-01-31 NOTE — Progress Notes (Signed)
? ?Subjective:  ? ? Patient ID: Shelly Clark, female    DOB: [birth year], 76 y.o.   MRN: [id number] ? ?HPI ? ?76 year old female who  has a past medical history of Benign hematuria, Diabetes mellitus without complication ([place]), Esophageal dysmotility, Hyperlipidemia, Hypertension, Migraines, Nicotine dependence, cigarettes, uncomplicated (91/63/8466), Osteoporosis, and Tobacco abuse. ? ?She presents to the office today for pump on the back of my neck that seems to be getting larger".  Denies pain, redness, or drainage.  Has been present for roughly 1 year. ? ?She also needs a refill of Klonopin and Prozac that she takes for insomnia and mood disorder.  Feels as though these issues are well controlled with her current medication doses ? ?Review of Systems ?See HPI  ? ?Past Medical History:  ?Diagnosis Date  ? Benign hematuria   ? neg urology workup  ? Diabetes mellitus without complication ([place])   ? Esophageal dysmotility   ? Hyperlipidemia   ? Hypertension   ? Migraines   ? Nicotine dependence, cigarettes, uncomplicated 59/93/5701  ? Osteoporosis   ? Tobacco abuse   ? ? ?Social History  ? ?Socioeconomic History  ? Marital status: Married  ?  Spouse name: Not on file  ? Number of children: Not on file  ? Years of education: Not on file  ? Highest education level: Not on file  ?Occupational History  ? Occupation: OWNER  ?  Employer: [employer]  ?  Comment: Restaurant  ?Tobacco Use  ? Smoking status: Every Day  ?  Packs/day: 1.00  ?  Years: 40.00  ?  Pack years: 40.00  ?  Types: Cigarettes  ? Smokeless tobacco: Never  ?Substance and Sexual Activity  ? Alcohol use: No  ? Drug use: No  ? Sexual activity: Not on file  ?Other Topics Concern  ? Not on file  ?Social History Narrative  ? ** Merged History Encounter **  ?    ? Owns a home  ?She is married  ?Two children  ? ?  ? ?Social Determinants of Health  ? ?Financial Resource Strain: Low Risk   ? Difficulty of Paying Living Expenses: Not hard at all  ?Food Insecurity: No  Food Insecurity  ? Worried About Charity fundraiser in the Last Year: Never true  ? Ran Out of Food in the Last Year: Never true  ?Transportation Needs: No Transportation Needs  ? Lack of Transportation (Medical): No  ? Lack of Transportation (Non-Medical): No  ?Physical Activity: Sufficiently Active  ? Days of Exercise per Week: 5 days  ? Minutes of Exercise per Session: 60 min  ?Stress: No Stress Concern Present  ? Feeling of Stress : Not at all  ?Social Connections: Socially Integrated  ? Frequency of Communication with Friends and Family: Three times a week  ? Frequency of Social Gatherings with Friends and Family: Not on file  ? Attends Religious Services: More than 4 times per year  ? Active Member of Clubs or Organizations: Yes  ? Attends Archivist Meetings: Never  ? Marital Status: Married  ?Intimate Partner Violence: Not At Risk  ? Fear of Current or Ex-Partner: No  ? Emotionally Abused: No  ? Physically Abused: No  ? Sexually Abused: No  ? ? ?Past Surgical History:  ?Procedure Laterality Date  ? ABDOMINAL HYSTERECTOMY    ? MOUTH SURGERY    ? ? ?Family History  ?Problem Relation Age of Onset  ? Ovarian cancer Mother   ?  Diabetes Mother   ? Hypertension Mother   ? Hyperlipidemia Mother   ? Diabetes Father   ? Hypertension Father   ? Hyperlipidemia Father   ? Hypertension Brother   ? Hypertension Daughter   ? Hypertension Daughter   ? ? ?Allergies  ?Allergen Reactions  ? Codeine Nausea And Vomiting  ? ? ?Current Outpatient Medications on File Prior to Visit  ?Medication Sig Dispense Refill  ? albuterol (VENTOLIN HFA) 108 (90 Base) MCG/ACT inhaler Inhale 2 puffs into the lungs every 6 (six) hours as needed for wheezing or shortness of breath. 8 g 2  ? amLODipine (NORVASC) 10 MG tablet TAKE 1 TABLET(10 MG) BY MOUTH DAILY 90 tablet 3  ? aspirin EC 81 MG tablet Take 81 mg by mouth daily.    ? B Complex-C (B-COMPLEX WITH VITAMIN C) tablet Take 1 tablet by mouth daily.    ? budesonide-formoterol  (SYMBICORT) 80-4.5 MCG/ACT inhaler Inhale 2 puffs into the lungs in the morning and at bedtime. 1 each 2  ? ciprofloxacin (CIPRO) 500 MG tablet Take 1 tablet (500 mg total) by mouth 2 (two) times daily. 14 tablet 0  ? glipiZIDE (GLUCOTROL XL) 5 MG 24 hr tablet TAKE 1 TABLET(5 MG) BY MOUTH DAILY WITH BREAKFAST 90 tablet 1  ? hydrochlorothiazide (HYDRODIURIL) 25 MG tablet Take 1 tablet (25 mg total) by mouth daily. 90 tablet 3  ? lisinopril (ZESTRIL) 40 MG tablet Take 1 tablet (40 mg total) by mouth daily. 90 tablet 3  ? metFORMIN (GLUCOPHAGE) 500 MG tablet TAKE 1 TABLET BY MOUTH TWICE DAILY WITH A MEAL 180 tablet 1  ? Multiple Vitamin (MULTIVITAMIN WITH MINERALS) TABS Take 1 tablet by mouth daily.    ? nicotine (NICODERM CQ - DOSED IN MG/24 HOURS) 21 mg/24hr patch Place 1 patch (21 mg total) onto the skin daily. 28 patch 0  ? pantoprazole (PROTONIX) 20 MG tablet Take 1 tablet (20 mg total) by mouth daily. 30 tablet 0  ? simvastatin (ZOCOR) 20 MG tablet Take 1 tablet (20 mg total) by mouth daily. 90 tablet 3  ? triamcinolone ointment (KENALOG) 0.5 % APPLY TOPICALLY TO THE AFFECTED AREA TWICE DAILY 30 g 1  ? ?No current facility-administered medications on file prior to visit.  ? ? ?BP 130/80   Pulse 86   Temp 98.9 ?F (37.2 ?C) (Oral)   Ht '4\' 11"'$  (1.499 m)   Wt 131 lb (59.4 kg)   SpO2 96%   BMI 26.46 kg/m?  ? ? ?   ?Objective:  ? Physical Exam ?Vitals and nursing note reviewed.  ?Constitutional:   ?   Appearance: Normal appearance.  ?Skin: ?   General: Skin is warm and dry.  ?   Comments: Quarter sized hard episode dermoid cyst noted on cervical spine.  ?Neurological:  ?   General: No focal deficit present.  ?   Mental Status: She is alert and oriented to person, place, and time.  ?Psychiatric:     ?   Mood and Affect: Mood normal.     ?   Behavior: Behavior normal.     ?   Thought Content: Thought content normal.     ?   Judgment: Judgment normal.  ? ?   ?Assessment & Plan:  ?1. Epidermoid cyst ?- She would like  to be referred to dermatology for I&D  due to location.  ?- Ambulatory referral to Dermatology ? ?2. Adjustment disorder with mixed anxiety and depressed mood ? ?- FLUoxetine (PROZAC) 20  MG tablet; Take 1 tablet (20 mg total) by mouth daily.  Dispense: 90 tablet; Refill: 1 ? ?3. Insomnia, unspecified type ? ?- clonazePAM (KLONOPIN) 0.5 MG tablet; Take 1 tablet (0.5 mg total) by mouth at bedtime as needed. for sleep  Dispense: 30 tablet; Refill: 2 ? ?[name], NP ? ?

## 2022-02-13 ENCOUNTER — Encounter: Payer: Self-pay | Admitting: Adult Health

## 2022-02-13 NOTE — Telephone Encounter (Signed)
Please advise 

## 2022-02-14 ENCOUNTER — Other Ambulatory Visit: Payer: Self-pay | Admitting: Adult Health

## 2022-02-14 MED ORDER — ONDANSETRON HCL 4 MG PO TABS: 4.0000 mg | ORAL_TABLET | Freq: Three times a day (TID) | ORAL | 1 refills | Status: DC | PRN

## 2022-02-23 ENCOUNTER — Encounter: Payer: Self-pay | Admitting: Family Medicine

## 2022-02-23 ENCOUNTER — Ambulatory Visit (INDEPENDENT_AMBULATORY_CARE_PROVIDER_SITE_OTHER): Payer: PPO | Admitting: Family Medicine

## 2022-02-23 VITALS — BP 141/76 | HR 81 | Temp 98.1°F | Wt 129.6 lb

## 2022-02-23 DIAGNOSIS — R300 Dysuria: Secondary | ICD-10-CM

## 2022-02-23 DIAGNOSIS — J069 Acute upper respiratory infection, unspecified: Secondary | ICD-10-CM | POA: Diagnosis not present

## 2022-02-23 LAB — POCT INFLUENZA A/B
Influenza A, POC: NEGATIVE
Influenza B, POC: NEGATIVE

## 2022-02-23 LAB — POCT URINALYSIS DIPSTICK
Bilirubin, UA: NEGATIVE
Blood, UA: NEGATIVE
Glucose, UA: NEGATIVE
Ketones, UA: NEGATIVE
Leukocytes, UA: NEGATIVE
Nitrite, UA: NEGATIVE
Protein, UA: POSITIVE — AB
Spec Grav, UA: 1.02 (ref 1.010–1.025)
Urobilinogen, UA: NEGATIVE E.U./dL — AB
pH, UA: 6 (ref 5.0–8.0)

## 2022-02-23 LAB — POC COVID19 BINAXNOW: SARS Coronavirus 2 Ag: NEGATIVE

## 2022-02-23 NOTE — Addendum Note (Signed)
Addended by: Anderson Malta on: 02/23/2022 11:17 AM ? ? Modules accepted: Orders ? ?

## 2022-02-23 NOTE — Progress Notes (Signed)
Subjective:  ? ? Patient ID: Shelly Clark, female    DOB: [birth year], 76 y.o.   MRN: [id number] ? ?Chief Complaint  ?Patient presents with  ? Urinary Tract Infection  ?  Pressure when urinating. Tired. Body ache. No symptoms, but tested with home test and showed uti  ? ? ?HPI ?Patient followed by [name], NP was seen today for acute concern.  Pt states she took at [facility] home urine test that said she had a UTI.  Pt with elevated temp (67F), suprapubic pain/pressure, low back pain, chills. Pt drinking 4-5 bottles of water per day.  Pt concerned as she was hospitalized in the past for urosepsis. ? ?Pt had oral surgery last wk and is getting dental implants next wk.  Eating a soft diet.  Notes loose stools, 1-2 x per day. ? ?Past Medical History:  ?Diagnosis Date  ? Benign hematuria   ? neg urology workup  ? Diabetes mellitus without complication ([place])   ? Esophageal dysmotility   ? Hyperlipidemia   ? Hypertension   ? Migraines   ? Nicotine dependence, cigarettes, uncomplicated 84/16/6063  ? Osteoporosis   ? Tobacco abuse   ? ? ?Allergies  ?Allergen Reactions  ? Codeine Nausea And Vomiting  ? ? ?ROS ?General: Deniesnight sweats, changes in weight, changes in appetite  +elevated temp, chills ?HEENT: Denies headaches, ear pain, changes in vision, rhinorrhea, sore throat   ?CV: Denies CP, palpitations, SOB, orthopnea ?Pulm: Denies SOB, cough, wheezing +cough ?GI: Denies abdominal pain, nausea, vomiting, diarrhea, constipation +loose stools ?GU: Denies dysuria, hematuria, frequency, vaginal discharge  +dysuria, suprapubic pain/pressure ?Msk: Denies muscle cramps, joint pains  +mild back pain ?Neuro: Denies weakness, numbness, tingling ?Skin: Denies rashes, bruising ?Psych: Denies depression, anxiety, hallucinations ? ?Objective:  ?  ?Blood pressure (!) 141/76, pulse 81, temperature 98.1 ?F (36.7 ?C), temperature source Oral, weight 129 lb 9.6 oz (58.8 kg), SpO2 97 %. ? ?Gen. Pleasant, well-nourished, in no distress,  normal affect   ?HEENT: [place]/AT, face symmetric, conjunctiva clear, no scleral icterus, PERRLA, EOMI, nares patent without drainage. ?Lungs: intermittent dry cough, no accessory muscle use, CTAB, no wheezes or rales ?Cardiovascular: RRR, no m/r/g, no peripheral edema ?Abdomen: BS present, soft, NT/ND, no hepatosplenomegaly. ?Musculoskeletal: No deformities, no cyanosis or clubbing, normal tone ?Neuro:  A&Ox3, CN II-XII intact, normal gait ?Skin:  Warm, no lesions/ rash ? ? ?Wt Readings from Last 3 Encounters:  ?02/23/22 129 lb 9.6 oz (58.8 kg)  ?01/31/22 131 lb (59.4 kg)  ?11/19/21 130 lb (59 kg)  ? ? ?Lab Results  ?Component Value Date  ? WBC 6.3 10/05/2021  ? HGB 14.9 10/05/2021  ? HCT 45.1 10/05/2021  ? PLT 276.0 10/05/2021  ? GLUCOSE 97 10/05/2021  ? CHOL 187 10/05/2021  ? TRIG 166.0 (H) 10/05/2021  ? HDL 63.90 10/05/2021  ? LDLDIRECT 91.0 04/16/2020  ? [place] 90 10/05/2021  ? ALT 42 (H) 10/05/2021  ? AST 37 10/05/2021  ? NA 135 10/05/2021  ? K 4.1 10/05/2021  ? CL 99 10/05/2021  ? CREATININE 0.68 10/05/2021  ? BUN 26 (H) 10/05/2021  ? CO2 28 10/05/2021  ? TSH 2.55 10/05/2021  ? INR 1.1 11/12/2020  ? HGBA1C 6.7 (H) 10/05/2021  ? MICROALBUR 60.6 (H) 06/17/2021  ? ? ?Assessment/Plan: ? ?Dysuria  ?-UA without signs of infection.  UA positive for protein and SG 1.020. ?-increase water and fluid intake ?-will send for UCx. ?-Abx if needed based on Cx results. ?- Plan: POCT urinalysis dipstick,  Culture, Urine ? ?Viral URI with cough ?-POC COVID and flu testing negative. ?-OTC medications for symptoms. ?-loose stools likely 2/2 soft diet due to recent dental surgery.  OTC imodium if needed. ? ?F/u prn ? ?Grier Mitts, MD ?

## 2022-02-23 NOTE — Patient Instructions (Signed)
You urine did not show obvious signs of infection.  We will send the urine to the lab for urine culture.  If the culture has signs of a UTI we will send in medication. ? ?Continue over the counter medications for your cough/cold symptoms. ?

## 2022-02-24 LAB — URINE CULTURE
MICRO NUMBER:: 13201707
SPECIMEN QUALITY:: ADEQUATE

## 2022-04-04 ENCOUNTER — Ambulatory Visit: Payer: PPO | Admitting: Adult Health

## 2022-04-11 ENCOUNTER — Ambulatory Visit (INDEPENDENT_AMBULATORY_CARE_PROVIDER_SITE_OTHER): Payer: PPO | Admitting: Adult Health

## 2022-04-11 ENCOUNTER — Encounter: Payer: Self-pay | Admitting: Adult Health

## 2022-04-11 VITALS — BP 140/78 | HR 70 | Temp 97.7°F | Ht 59.0 in | Wt 128.0 lb

## 2022-04-11 DIAGNOSIS — R300 Dysuria: Secondary | ICD-10-CM

## 2022-04-11 DIAGNOSIS — I10 Essential (primary) hypertension: Secondary | ICD-10-CM

## 2022-04-11 DIAGNOSIS — E119 Type 2 diabetes mellitus without complications: Secondary | ICD-10-CM

## 2022-04-11 LAB — POCT GLYCOSYLATED HEMOGLOBIN (HGB A1C): HbA1c, POC (controlled diabetic range): 6.3 % (ref 0.0–7.0)

## 2022-04-11 LAB — POCT URINALYSIS DIPSTICK
Bilirubin, UA: NEGATIVE
Glucose, UA: NEGATIVE
Ketones, UA: NEGATIVE
Nitrite, UA: NEGATIVE
Protein, UA: POSITIVE — AB
Spec Grav, UA: 1.01 (ref 1.010–1.025)
Urobilinogen, UA: 0.2 E.U./dL
pH, UA: 6.5 (ref 5.0–8.0)

## 2022-04-11 MED ORDER — AMOXICILLIN-POT CLAVULANATE 875-125 MG PO TABS: 1.0000 | ORAL_TABLET | Freq: Two times a day (BID) | ORAL | 0 refills | Status: DC

## 2022-04-11 NOTE — Progress Notes (Signed)
? ?Subjective:  ? ? Patient ID: Shelly Clark, female    DOB: 21-Jun-1946, 76 y.o.   MRN: [id number] ? ?HPI ?76 year old female who  has a past medical history of Benign hematuria, Diabetes mellitus without complication ([place]), Esophageal dysmotility, Hyperlipidemia, Hypertension, Migraines, Nicotine dependence, cigarettes, uncomplicated (40/98/1191), Osteoporosis, and Tobacco abuse. ? ?She presents to the office today for 6 month follow up regarding HTN and DM type 2 ? ?HTN - prescrbed Norvasc 10 mg daily, lisinopril 40 mg daily, and HCTZ 12.5 mg daily. She denies dizziness, lightheadedness, blurred vision, chest pain, or shortness of breath  ?BP Readings from Last 3 Encounters:  ?04/11/22 140/78  ?02/23/22 (!) 141/76  ?01/31/22 130/80  ? ? ?DM  Type 2- managed with glipizide 5 mg daily and metformin 500 mg BID. She does not check her blood sugar at home. Does not report any episodes of hypoglycemia  ?Lab Results  ?Component Value Date  ? HGBA1C 6.7 (H) 10/05/2021  ? ?Additionally, she reports that over the last 2 days she has developed UTI like symptoms. Her symptoms include that of urinary frequency, urgency, dysuria, and discolored urine.  ? ?Review of Systems ?See HPI  ?Past Medical History:  ?Diagnosis Date  ? Benign hematuria   ? neg urology workup  ? Diabetes mellitus without complication ([place])   ? Esophageal dysmotility   ? Hyperlipidemia   ? Hypertension   ? Migraines   ? Nicotine dependence, cigarettes, uncomplicated 47/82/9562  ? Osteoporosis   ? Tobacco abuse   ? ? ?Social History  ? ?Socioeconomic History  ? Marital status: Married  ?  Spouse name: Not on file  ? Number of children: Not on file  ? Years of education: Not on file  ? Highest education level: Not on file  ?Occupational History  ? Occupation: OWNER  ?  Employer: [employer]  ?  Comment: Restaurant  ?Tobacco Use  ? Smoking status: Every Day  ?  Packs/day: 1.00  ?  Years: 40.00  ?  Pack years: 40.00  ?  Types: Cigarettes  ? Smokeless tobacco:  Never  ?Substance and Sexual Activity  ? Alcohol use: No  ? Drug use: No  ? Sexual activity: Not on file  ?Other Topics Concern  ? Not on file  ?Social History Narrative  ? ** Merged History Encounter **  ?    ? Owns a home  ?She is married  ?Two children  ? ?  ? ?Social Determinants of Health  ? ?Financial Resource Strain: Low Risk   ? Difficulty of Paying Living Expenses: Not hard at all  ?Food Insecurity: No Food Insecurity  ? Worried About Charity fundraiser in the Last Year: Never true  ? Ran Out of Food in the Last Year: Never true  ?Transportation Needs: No Transportation Needs  ? Lack of Transportation (Medical): No  ? Lack of Transportation (Non-Medical): No  ?Physical Activity: Sufficiently Active  ? Days of Exercise per Week: 5 days  ? Minutes of Exercise per Session: 60 min  ?Stress: No Stress Concern Present  ? Feeling of Stress : Not at all  ?Social Connections: Socially Integrated  ? Frequency of Communication with Friends and Family: Three times a week  ? Frequency of Social Gatherings with Friends and Family: Not on file  ? Attends Religious Services: More than 4 times per year  ? Active Member of Clubs or Organizations: Yes  ? Attends Archivist Meetings: Never  ? Marital Status:  Married  ?Intimate Partner Violence: Not At Risk  ? Fear of Current or Ex-Partner: No  ? Emotionally Abused: No  ? Physically Abused: No  ? Sexually Abused: No  ? ? ?Past Surgical History:  ?Procedure Laterality Date  ? ABDOMINAL HYSTERECTOMY    ? MOUTH SURGERY    ? ? ?Family History  ?Problem Relation Age of Onset  ? Ovarian cancer Mother   ? Diabetes Mother   ? Hypertension Mother   ? Hyperlipidemia Mother   ? Diabetes Father   ? Hypertension Father   ? Hyperlipidemia Father   ? Hypertension Brother   ? Hypertension Daughter   ? Hypertension Daughter   ? ? ?Allergies  ?Allergen Reactions  ? Codeine Nausea And Vomiting  ? ? ?Current Outpatient Medications on File Prior to Visit  ?Medication Sig Dispense Refill   ? metFORMIN (GLUCOPHAGE) 500 MG tablet Take 500 mg by mouth 2 (two) times daily with a meal.    ? albuterol (VENTOLIN HFA) 108 (90 Base) MCG/ACT inhaler Inhale 2 puffs into the lungs every 6 (six) hours as needed for wheezing or shortness of breath. 8 g 2  ? amLODipine (NORVASC) 10 MG tablet TAKE 1 TABLET(10 MG) BY MOUTH DAILY 90 tablet 3  ? aspirin EC 81 MG tablet Take 81 mg by mouth daily.    ? B Complex-C (B-COMPLEX WITH VITAMIN C) tablet Take 1 tablet by mouth daily.    ? budesonide-formoterol (SYMBICORT) 80-4.5 MCG/ACT inhaler Inhale 2 puffs into the lungs in the morning and at bedtime. 1 each 2  ? clonazePAM (KLONOPIN) 0.5 MG tablet Take 1 tablet (0.5 mg total) by mouth at bedtime as needed. for sleep 30 tablet 2  ? FLUoxetine (PROZAC) 20 MG tablet Take 1 tablet (20 mg total) by mouth daily. 90 tablet 1  ? glipiZIDE (GLUCOTROL XL) 5 MG 24 hr tablet TAKE 1 TABLET(5 MG) BY MOUTH DAILY WITH BREAKFAST 90 tablet 1  ? hydrochlorothiazide (HYDRODIURIL) 25 MG tablet Take 1 tablet (25 mg total) by mouth daily. 90 tablet 3  ? lisinopril (ZESTRIL) 40 MG tablet Take 1 tablet (40 mg total) by mouth daily. 90 tablet 3  ? Multiple Vitamin (MULTIVITAMIN WITH MINERALS) TABS Take 1 tablet by mouth daily.    ? nicotine (NICODERM CQ - DOSED IN MG/24 HOURS) 21 mg/24hr patch Place 1 patch (21 mg total) onto the skin daily. 28 patch 0  ? ondansetron (ZOFRAN) 4 MG tablet Take 1 tablet (4 mg total) by mouth every 8 (eight) hours as needed for nausea or vomiting. 20 tablet 1  ? pantoprazole (PROTONIX) 20 MG tablet Take 1 tablet (20 mg total) by mouth daily. 30 tablet 0  ? simvastatin (ZOCOR) 20 MG tablet Take 1 tablet (20 mg total) by mouth daily. 90 tablet 3  ? triamcinolone ointment (KENALOG) 0.5 % APPLY TOPICALLY TO THE AFFECTED AREA TWICE DAILY 30 g 1  ? ?No current facility-administered medications on file prior to visit.  ? ? ?BP 140/78   Pulse 70   Temp 97.7 ?F (36.5 ?C) (Oral)   Ht '4\' 11"'$  (1.499 m)   Wt 128 lb (58.1 kg)    SpO2 96%   BMI 25.85 kg/m?  ? ? ? ? ? ? ?   ?Objective:  ? Physical Exam ?Vitals and nursing note reviewed.  ?Constitutional:   ?   Appearance: Normal appearance.  ?Cardiovascular:  ?   Rate and Rhythm: Normal rate and regular rhythm.  ?   Pulses: Normal pulses.  ?  Heart sounds: Normal heart sounds.  ?Pulmonary:  ?   Effort: Pulmonary effort is normal.  ?   Breath sounds: Normal breath sounds.  ?Abdominal:  ?   General: Bowel sounds are normal. There is no distension.  ?   Palpations: Abdomen is soft. There is no mass.  ?   Tenderness: There is abdominal tenderness. There is no right CVA tenderness, left CVA tenderness, guarding or rebound.  ?Skin: ?   General: Skin is warm and dry.  ?   Capillary Refill: Capillary refill takes less than 2 seconds.  ?Neurological:  ?   General: No focal deficit present.  ?   Mental Status: She is alert and oriented to person, place, and time. Mental status is at baseline.  ?Psychiatric:     ?   Mood and Affect: Mood normal.     ?   Behavior: Behavior normal.     ?   Thought Content: Thought content normal.     ?   Judgment: Judgment normal.  ? ? ?   ?Assessment & Plan:  ?1. Controlled type 2 diabetes mellitus without complication, without long-term current use of insulin ([place]) ? ?- POC HgB A1c- 6.2  ?- Continue with metformin and glipizide  ? ?2. Essential hypertension ?- Slightly elevated today  ?- Will have her monitor at home  ? ?3. Dysuria ? ?- POCT Urinalysis Dipstick + leuks and blood. Will treat with augmentin based on last Urine Culture results.  ?- Culture, Urine ?- amoxicillin-clavulanate (AUGMENTIN) 875-125 MG tablet; Take 1 tablet by mouth 2 (two) times daily.  Dispense: 14 tablet; Refill: 0 ? ?[name], NP ? ?

## 2022-04-13 LAB — URINE CULTURE
MICRO NUMBER:: 13402358
SPECIMEN QUALITY:: ADEQUATE

## 2022-04-28 ENCOUNTER — Telehealth: Payer: Self-pay | Admitting: Adult Health

## 2022-04-28 NOTE — Telephone Encounter (Signed)
Left message for patient to call back and schedule Medicare Annual Wellness Visit (AWV) either virtually or in office. Left  my [name] number [phone]   Last AWV ;05/04/21  please schedule at anytime with [facility] Nurse Health Advisor 1 or 2

## 2022-04-28 NOTE — Telephone Encounter (Signed)
Correction  tried calling patient  no answer

## 2022-05-04 ENCOUNTER — Encounter: Payer: Self-pay | Admitting: Adult Health

## 2022-05-04 ENCOUNTER — Telehealth: Payer: Self-pay | Admitting: Adult Health

## 2022-05-04 NOTE — Telephone Encounter (Signed)
Pt seeking clarity on how she should be taking her blood pressure meds. Wants someone to call her daughter and explain

## 2022-05-04 NOTE — Telephone Encounter (Signed)
Spoke with [name], pt's daughter per FYI. Informed her that her mother is taking 3 meds for BP -lisinopril '40mg'$ , HCTZ '25mg'$ , and amlodipine '10mg'$ . Informed her that Tommi Rumps advised to take all 3 pills in the morning. [name] repeated back and stated she will let her mother know and have her take all three in the mornings.

## 2022-05-04 NOTE — Telephone Encounter (Signed)
Spoke with [name] and clarified what her mother should be taking for her BP.

## 2022-05-09 ENCOUNTER — Encounter: Payer: Self-pay | Admitting: Adult Health

## 2022-05-09 ENCOUNTER — Ambulatory Visit (INDEPENDENT_AMBULATORY_CARE_PROVIDER_SITE_OTHER): Payer: PPO | Admitting: Adult Health

## 2022-05-09 VITALS — BP 150/64 | HR 86 | Temp 97.8°F | Ht 59.0 in | Wt 130.2 lb

## 2022-05-09 DIAGNOSIS — I10 Essential (primary) hypertension: Secondary | ICD-10-CM | POA: Diagnosis not present

## 2022-05-09 MED ORDER — NICOTINE 21 MG/24HR TD PT24: 21.0000 mg | MEDICATED_PATCH | Freq: Every day | TRANSDERMAL | 11 refills | Status: DC

## 2022-05-09 NOTE — Progress Notes (Signed)
Subjective:    Patient ID: Shelly Clark, female    DOB: 11-24-1946, 76 y.o.   MRN: [id number]  HPI 76 year old female who  has a past medical history of Benign hematuria, Diabetes mellitus without complication ([place]), Esophageal dysmotility, Hyperlipidemia, Hypertension, Migraines, Nicotine dependence, cigarettes, uncomplicated (26/94/8546), Osteoporosis, and Tobacco abuse.  She presents to the office today for follow up regarding elevated blood pressure readings. She is currently managed with Norvasc 10 mg daily, HCTZ 25 mg daily, and lisinopril 40 mg daily. She has been checking her blood pressures at home with readings last week in the 170/90 range. She noticed that she had not been taking her Norvasc for about 4 months. She called and got a refill of the medication and has been taking Norvasc in addition to her other BP medications over the last 4 days and her BP has been slowly coming down   BP Readings from Last 3 Encounters:  05/09/22 (!) 150/64  04/11/22 140/78  02/23/22 (!) 141/76    Review of Systems See HPI   Past Medical History:  Diagnosis Date   Benign hematuria    neg urology workup   Diabetes mellitus without complication ([place])    Esophageal dysmotility    Hyperlipidemia    Hypertension    Migraines    Nicotine dependence, cigarettes, uncomplicated 27/01/5008   Osteoporosis    Tobacco abuse     Social History   Socioeconomic History   Marital status: Married    Spouse name: Not on file   Number of children: Not on file   Years of education: Not on file   Highest education level: Not on file  Occupational History   Occupation: OWNER    Employer: [employer]    Comment: Restaurant  Tobacco Use   Smoking status: Every Day    Packs/day: 1.00    Years: 40.00    Total pack years: 40.00    Types: Cigarettes   Smokeless tobacco: Never  Substance and Sexual Activity   Alcohol use: No   Drug use: No   Sexual activity: Not on file  Other Topics Concern    Not on file  Social History Narrative   ** Merged History Encounter **       Owns a home  She is married  Two children      Social Determinants of Health   Financial Resource Strain: Low Risk  (05/04/2021)   Overall Financial Resource Strain (CARDIA)    Difficulty of Paying Living Expenses: Not hard at all  Food Insecurity: No Food Insecurity (05/04/2021)   Hunger Vital Sign    Worried About Running Out of Food in the Last Year: Never true    Ran Out of Food in the Last Year: Never true  Transportation Needs: No Transportation Needs (05/04/2021)   PRAPARE - Hydrologist (Medical): No    Lack of Transportation (Non-Medical): No  Physical Activity: Sufficiently Active (05/04/2021)   Exercise Vital Sign    Days of Exercise per Week: 5 days    Minutes of Exercise per Session: 60 min  Stress: No Stress Concern Present (05/04/2021)   [place]    Feeling of Stress : Not at all  Social Connections: [place] (05/04/2021)   Social Connection and Isolation Panel [NHANES]    Frequency of Communication with Friends and Family: Three times a week    Frequency of Social Gatherings with Friends  and Family: Not on file    Attends Religious Services: More than 4 times per year    Active Member of Clubs or Organizations: Yes    Attends Club or Organization Meetings: Never    Marital Status: Married  Human resources officer Violence: Not At Risk (05/04/2021)   Humiliation, Afraid, Rape, and Kick questionnaire    Fear of Current or Ex-Partner: No    Emotionally Abused: No    Physically Abused: No    Sexually Abused: No    Past Surgical History:  Procedure Laterality Date   ABDOMINAL HYSTERECTOMY     MOUTH SURGERY      Family History  Problem Relation Age of Onset   Ovarian cancer Mother    Diabetes Mother    Hypertension Mother    Hyperlipidemia Mother    Diabetes Father    Hypertension Father     Hyperlipidemia Father    Hypertension Brother    Hypertension Daughter    Hypertension Daughter     Allergies  Allergen Reactions   Codeine Nausea And Vomiting    Current Outpatient Medications on File Prior to Visit  Medication Sig Dispense Refill   albuterol (VENTOLIN HFA) 108 (90 Base) MCG/ACT inhaler Inhale 2 puffs into the lungs every 6 (six) hours as needed for wheezing or shortness of breath. 8 g 2   amLODipine (NORVASC) 10 MG tablet TAKE 1 TABLET(10 MG) BY MOUTH DAILY 90 tablet 3   amoxicillin-clavulanate (AUGMENTIN) 875-125 MG tablet Take 1 tablet by mouth 2 (two) times daily. 14 tablet 0   aspirin EC 81 MG tablet Take 81 mg by mouth daily.     B Complex-C (B-COMPLEX WITH VITAMIN C) tablet Take 1 tablet by mouth daily.     budesonide-formoterol (SYMBICORT) 80-4.5 MCG/ACT inhaler Inhale 2 puffs into the lungs in the morning and at bedtime. 1 each 2   clonazePAM (KLONOPIN) 0.5 MG tablet Take 1 tablet (0.5 mg total) by mouth at bedtime as needed. for sleep 30 tablet 2   FLUoxetine (PROZAC) 20 MG tablet Take 1 tablet (20 mg total) by mouth daily. 90 tablet 1   glipiZIDE (GLUCOTROL XL) 5 MG 24 hr tablet TAKE 1 TABLET(5 MG) BY MOUTH DAILY WITH BREAKFAST 90 tablet 1   hydrochlorothiazide (HYDRODIURIL) 25 MG tablet Take 1 tablet (25 mg total) by mouth daily. 90 tablet 3   lisinopril (ZESTRIL) 40 MG tablet Take 1 tablet (40 mg total) by mouth daily. 90 tablet 3   metFORMIN (GLUCOPHAGE) 500 MG tablet Take 500 mg by mouth 2 (two) times daily with a meal.     Multiple Vitamin (MULTIVITAMIN WITH MINERALS) TABS Take 1 tablet by mouth daily.     ondansetron (ZOFRAN) 4 MG tablet Take 1 tablet (4 mg total) by mouth every 8 (eight) hours as needed for nausea or vomiting. 20 tablet 1   pantoprazole (PROTONIX) 20 MG tablet Take 1 tablet (20 mg total) by mouth daily. 30 tablet 0   simvastatin (ZOCOR) 20 MG tablet Take 1 tablet (20 mg total) by mouth daily. 90 tablet 3   triamcinolone ointment  (KENALOG) 0.5 % APPLY TOPICALLY TO THE AFFECTED AREA TWICE DAILY 30 g 1   No current facility-administered medications on file prior to visit.    BP (!) 150/64 (BP Location: Left Arm, Patient Position: Sitting, Cuff Size: Normal)   Pulse 86   Temp 97.8 F (36.6 C) (Oral)   Ht '4\' 11"'$  (1.499 m)   Wt 130 lb 3.2 oz (59.1 kg)  SpO2 94%   BMI 26.30 kg/m       Objective:   Physical Exam Vitals and nursing note reviewed.  Constitutional:      Appearance: Normal appearance.  Cardiovascular:     Rate and Rhythm: Normal rate and regular rhythm.     Pulses: Normal pulses.     Heart sounds: Normal heart sounds.  Pulmonary:     Effort: Pulmonary effort is normal.     Breath sounds: Normal breath sounds.  Musculoskeletal:        General: Normal range of motion.  Skin:    General: Skin is warm and dry.     Capillary Refill: Capillary refill takes less than 2 seconds.  Neurological:     General: No focal deficit present.     Mental Status: She is alert and oriented to person, place, and time.       Assessment & Plan:  1. Essential hypertension - BP should return to baseline within in the next couple of weeks  [name], NP

## 2022-05-17 ENCOUNTER — Telehealth: Payer: Self-pay | Admitting: Adult Health

## 2022-05-17 NOTE — Telephone Encounter (Signed)
Spoke to patient to schedule Medicare Annual Wellness Visit (AWV) either virtually or in office. Left  my [name] number [phone]   She is going to [place] and would like a call bak in sept   Last AWV 05/04/21 please schedule at anytime with [facility] Nurse Health Advisor 1 or 2

## 2022-06-10 ENCOUNTER — Emergency Department (HOSPITAL_COMMUNITY): Payer: PPO

## 2022-06-10 ENCOUNTER — Telehealth (HOSPITAL_COMMUNITY): Payer: Self-pay | Admitting: Emergency Medicine

## 2022-06-10 ENCOUNTER — Emergency Department (HOSPITAL_COMMUNITY)
Admission: EM | Admit: 2022-06-10 | Discharge: 2022-06-10 | Disposition: A | Discharge status: Home / Self Care | Payer: PPO | Source: Home / Self Care | Attending: Emergency Medicine | Admitting: Emergency Medicine

## 2022-06-10 ENCOUNTER — Other Ambulatory Visit: Payer: Self-pay

## 2022-06-10 ENCOUNTER — Encounter (HOSPITAL_COMMUNITY): Payer: Self-pay

## 2022-06-10 DIAGNOSIS — Z79899 Other long term (current) drug therapy: Secondary | ICD-10-CM | POA: Insufficient documentation

## 2022-06-10 DIAGNOSIS — Z7984 Long term (current) use of oral hypoglycemic drugs: Secondary | ICD-10-CM | POA: Insufficient documentation

## 2022-06-10 DIAGNOSIS — E119 Type 2 diabetes mellitus without complications: Secondary | ICD-10-CM | POA: Insufficient documentation

## 2022-06-10 DIAGNOSIS — E871 Hypo-osmolality and hyponatremia: Secondary | ICD-10-CM | POA: Insufficient documentation

## 2022-06-10 DIAGNOSIS — F17200 Nicotine dependence, unspecified, uncomplicated: Secondary | ICD-10-CM | POA: Insufficient documentation

## 2022-06-10 DIAGNOSIS — I10 Essential (primary) hypertension: Secondary | ICD-10-CM | POA: Insufficient documentation

## 2022-06-10 DIAGNOSIS — R0602 Shortness of breath: Secondary | ICD-10-CM

## 2022-06-10 DIAGNOSIS — R110 Nausea: Secondary | ICD-10-CM | POA: Insufficient documentation

## 2022-06-10 DIAGNOSIS — Z7982 Long term (current) use of aspirin: Secondary | ICD-10-CM | POA: Insufficient documentation

## 2022-06-10 DIAGNOSIS — Z716 Tobacco abuse counseling: Secondary | ICD-10-CM | POA: Insufficient documentation

## 2022-06-10 DIAGNOSIS — R062 Wheezing: Secondary | ICD-10-CM | POA: Insufficient documentation

## 2022-06-10 DIAGNOSIS — J441 Chronic obstructive pulmonary disease with (acute) exacerbation: Secondary | ICD-10-CM | POA: Diagnosis not present

## 2022-06-10 LAB — BASIC METABOLIC PANEL
Anion gap: 11 (ref 5–15)
BUN: 12 mg/dL (ref 8–23)
CO2: 23 mmol/L (ref 22–32)
Calcium: 9.5 mg/dL (ref 8.9–10.3)
Chloride: 89 mmol/L — ABNORMAL LOW (ref 98–111)
Creatinine, Ser: 0.5 mg/dL (ref 0.44–1.00)
GFR, Estimated: >60 mL/min (ref >60)
Glucose, Bld: 124 mg/dL — ABNORMAL HIGH (ref 70–99)
Potassium: 3.8 mmol/L (ref 3.5–5.1)
Sodium: 123 mmol/L — ABNORMAL LOW (ref 135–145)

## 2022-06-10 LAB — CBC
HCT: 41.6 % (ref 36.0–46.0)
Hemoglobin: 14.4 g/dL (ref 12.0–15.0)
MCH: 28.6 pg (ref 26.0–34.0)
MCHC: 34.6 g/dL (ref 30.0–36.0)
MCV: 82.5 fL (ref 80.0–100.0)
Platelets: 286 10*3/uL (ref 150–400)
RBC: 5.04 MIL/uL (ref 3.87–5.11)
RDW: 12.8 % (ref 11.5–15.5)
WBC: 7.5 10*3/uL (ref 4.0–10.5)
nRBC: 0 % (ref 0.0–0.2)

## 2022-06-10 MED ORDER — DEXAMETHASONE SODIUM PHOSPHATE 10 MG/ML IJ SOLN
10.0000 mg | Freq: Once | INTRAMUSCULAR | Status: AC
Start: 2022-06-10 — End: 2022-06-10
  Administered 2022-06-10: 10 mg via INTRAMUSCULAR
  Filled 2022-06-10: qty 1

## 2022-06-10 MED ORDER — IPRATROPIUM-ALBUTEROL 0.5-2.5 (3) MG/3ML IN SOLN
3.0000 mL | Freq: Once | RESPIRATORY_TRACT | Status: AC
  Administered 2022-06-10: 3 mL via RESPIRATORY_TRACT
  Filled 2022-06-10: qty 3

## 2022-06-10 MED ORDER — ALBUTEROL SULFATE HFA 108 (90 BASE) MCG/ACT IN AERS: 1.0000 | INHALATION_SPRAY | Freq: Four times a day (QID) | RESPIRATORY_TRACT | 0 refills | Status: DC | PRN

## 2022-06-10 MED ORDER — PREDNISONE 20 MG PO TABS: 40.0000 mg | ORAL_TABLET | Freq: Every day | ORAL | 0 refills | Status: DC

## 2022-06-10 MED ORDER — ALBUTEROL SULFATE HFA 108 (90 BASE) MCG/ACT IN AERS
1.0000 | INHALATION_SPRAY | Freq: Four times a day (QID) | RESPIRATORY_TRACT | 0 refills | Status: DC | PRN
Start: 2022-06-10 — End: 2023-01-15

## 2022-06-10 NOTE — Telephone Encounter (Signed)
Albuterol sent to different pharmacy.

## 2022-06-10 NOTE — ED Provider Notes (Addendum)
[facility] DEPT Provider Note   CSN: [id number] Arrival date & time: 06/10/22  1623     History  Chief Complaint  Patient presents with   Shortness of Breath    Shelly Clark is a 76 y.o. female with medical history of nicotine dependence, diabetes, hypertension, migraines, osteoporosis.  The patient presents to ED for evaluation of shortness of breath.  Patient complains of shortness of breath, nausea.  Patient reports that nausea began 3 days ago when her air conditioning unit when out and the shortness of breath began yesterday.  The patient denies any history of COPD or asthma however does have a 100 pack year smoking history (50 year smoker, two packs a day).  Patient denies chest pain, vomiting, diarrhea, abdominal pain, lightheadedness, dizziness or weakness. Patient has diffuse wheezing on examination.   Shortness of Breath Associated symptoms: wheezing   Associated symptoms: no abdominal pain, no chest pain, no cough, no fever, no sore throat and no vomiting        Home Medications Prior to Admission medications   Medication Sig Start Date End Date Taking? Authorizing Provider  albuterol (VENTOLIN HFA) 108 (90 Base) MCG/ACT inhaler Inhale 1-2 puffs into the lungs every 6 (six) hours as needed for wheezing or shortness of breath. 06/10/22  Yes Azucena Cecil, PA-C  predniSONE (DELTASONE) 20 MG tablet Take 2 tablets (40 mg total) by mouth daily. 06/10/22  Yes Azucena Cecil, PA-C  amLODipine (NORVASC) 10 MG tablet TAKE 1 TABLET(10 MG) BY MOUTH DAILY 05/06/21   Nafziger, Tommi Rumps, NP  amoxicillin-clavulanate (AUGMENTIN) 875-125 MG tablet Take 1 tablet by mouth 2 (two) times daily. 04/11/22   Nafziger, Tommi Rumps, NP  aspirin EC 81 MG tablet Take 81 mg by mouth daily.    [provider]  B Complex-C (B-COMPLEX WITH VITAMIN C) tablet Take 1 tablet by mouth daily.    [provider]  budesonide-formoterol (SYMBICORT) 80-4.5 MCG/ACT  inhaler Inhale 2 puffs into the lungs in the morning and at bedtime. 11/14/20   Eugenie Filler, MD  clonazePAM (KLONOPIN) 0.5 MG tablet Take 1 tablet (0.5 mg total) by mouth at bedtime as needed. for sleep 01/31/22   [name], NP  FLUoxetine (PROZAC) 20 MG tablet Take 1 tablet (20 mg total) by mouth daily. 01/31/22   Nafziger, Tommi Rumps, NP  glipiZIDE (GLUCOTROL XL) 5 MG 24 hr tablet TAKE 1 TABLET(5 MG) BY MOUTH DAILY WITH BREAKFAST 11/29/21   Nafziger, Tommi Rumps, NP  hydrochlorothiazide (HYDRODIURIL) 25 MG tablet Take 1 tablet (25 mg total) by mouth daily. 08/30/21   Nafziger, Tommi Rumps, NP  lisinopril (ZESTRIL) 40 MG tablet Take 1 tablet (40 mg total) by mouth daily. 08/30/21   Nafziger, Tommi Rumps, NP  metFORMIN (GLUCOPHAGE) 500 MG tablet Take 500 mg by mouth 2 (two) times daily with a meal.    [provider]  Multiple Vitamin (MULTIVITAMIN WITH MINERALS) TABS Take 1 tablet by mouth daily.    [provider]  nicotine (NICODERM CQ) 21 mg/24hr patch Place 1 patch (21 mg total) onto the skin daily. 05/09/22   Nafziger, Tommi Rumps, NP  ondansetron (ZOFRAN) 4 MG tablet Take 1 tablet (4 mg total) by mouth every 8 (eight) hours as needed for nausea or vomiting. 02/14/22   Nafziger, Tommi Rumps, NP  pantoprazole (PROTONIX) 20 MG tablet Take 1 tablet (20 mg total) by mouth daily. 11/08/20   Wardell Honour, MD  simvastatin (ZOCOR) 20 MG tablet Take 1 tablet (20 mg total) by  mouth daily. 08/30/21   Nafziger, Tommi Rumps, NP  triamcinolone ointment (KENALOG) 0.5 % APPLY TOPICALLY TO THE AFFECTED AREA TWICE DAILY 12/07/21   [name], NP      Allergies    Codeine    Review of Systems   Review of Systems  Constitutional:  Negative for chills and fever.  HENT:  Negative for sore throat.   Respiratory:  Positive for shortness of breath and wheezing. Negative for cough.   Cardiovascular:  Negative for chest pain.  Gastrointestinal:  Positive for nausea. Negative for abdominal pain, diarrhea and vomiting.  All other  systems reviewed and are negative.   Physical Exam Updated Vital Signs BP (!) 169/74   Pulse 71   Temp 98.2 F (36.8 C) (Oral)   Resp 18   Ht 5' (1.524 m)   Wt 58.1 kg   SpO2 98%   BMI 25.00 kg/m  Physical Exam Vitals and nursing note reviewed.  Constitutional:      General: She is not in acute distress.    Appearance: She is well-developed. She is not ill-appearing, toxic-appearing or diaphoretic.  HENT:     Head: Normocephalic and atraumatic.  Eyes:     Extraocular Movements: Extraocular movements intact.     Pupils: Pupils are equal, round, and reactive to light.  Neck:     Vascular: No JVD.  Cardiovascular:     Rate and Rhythm: Normal rate and regular rhythm.  Pulmonary:     Effort: Pulmonary effort is normal. No accessory muscle usage.     Breath sounds: Examination of the right-upper field reveals wheezing. Examination of the left-upper field reveals wheezing. Examination of the right-middle field reveals wheezing. Examination of the left-middle field reveals wheezing. Decreased breath sounds and wheezing present. No rhonchi or rales.  Chest:     Chest wall: No mass or tenderness.  Abdominal:     General: Bowel sounds are normal.     Palpations: Abdomen is soft.     Tenderness: There is no abdominal tenderness.  Musculoskeletal:     Cervical back: Normal range of motion and neck supple.     Right lower leg: No edema.     Left lower leg: No edema.  Skin:    General: Skin is warm and dry.     Capillary Refill: Capillary refill takes less than 2 seconds.  Neurological:     Mental Status: She is alert and oriented to person, place, and time.     ED Results / Procedures / Treatments   Labs (all labs ordered are listed, but only abnormal results are displayed) Labs Reviewed  BASIC METABOLIC PANEL - Abnormal; Notable for the following components:      Result Value   Sodium 123 (*)    Chloride 89 (*)    Glucose, Bld 124 (*)    All other components within  normal limits  CBC    EKG EKG Interpretation  Date/Time:  Saturday June 10 2022 17:36:45 EDT Ventricular Rate:  75 PR Interval:  198 QRS Duration: 87 QT Interval:  414 QTC Calculation: 463 R Axis:   -35 Text Interpretation: Sinus rhythm Left axis deviation 12 Lead; Mason-Likar Confirmed by Dene Gentry [phone]) on 06/10/2022 5:48:00 PM  Radiology DG Chest 2 View  Result Date: 06/10/2022 CLINICAL DATA:  Short of breath, nausea for 3 days EXAM: CHEST - 2 VIEW COMPARISON:  01/27/2021 FINDINGS: Frontal and lateral views of the chest demonstrate an unremarkable cardiac silhouette. No acute airspace disease, effusion, or  pneumothorax. No acute bony abnormalities. IMPRESSION: 1. No acute intrathoracic process. Electronically Signed   By: Randa Ngo M.D.   On: 06/10/2022 17:17    Procedures Procedures   Medications Ordered in ED Medications  ipratropium-albuterol (DUONEB) 0.5-2.5 (3) MG/3ML nebulizer solution 3 mL (3 mLs Nebulization Given 06/10/22 1709)  dexamethasone (DECADRON) injection 10 mg (10 mg Intramuscular Given 06/10/22 1709)  ipratropium-albuterol (DUONEB) 0.5-2.5 (3) MG/3ML nebulizer solution 3 mL (3 mLs Nebulization Given 06/10/22 1741)    ED Course/ Medical Decision Making/ A&P                           Medical Decision Making Amount and/or Complexity of Data Reviewed Labs: ordered. Radiology: ordered.  Risk Prescription drug management.   76 year old female presents to the ED for evaluation.  Please see HPI for further details.  On examination the patient is afebrile and nontachycardic.  The patient does have diffuse wheezing about her upper and middle lung fields bilaterally.  The patient denies a history of COPD, asthma.  Patient does states she has an albuterol inhaler at home which she has been taking with relief for the last couple of months.  Patient abdomen soft and compressible in all 4 quadrants.  The patient is nontoxic in appearance.  Patient worked  up utilizing the following labs and imaging studies interpreted by me personally: - CBC unremarkable - BMP with decreased sodium. - Plain film imaging of chest shows no consolidation, effusion - EKG sinus rhythm, nonischemic  Patient treated with DuoNeb x2, 10 mg Decadron shot.  The patient has decreased wheezing after second DuoNeb was administered.  After second DuoNeb was given, patient wheezing resolved. I advised the patient her lab values were not yet back and would like for her to wait to receive them but she stated she wanted to go home because she was tired however she stated she felt much better. The patient was counseled on smoking cessation techniques and provided with an informational brochure. The patient was discharged with prednisone and an albuterol inhaler refill. Patient lab values were received back after patient had left and patient was found to be hyponatremic. I attempted to call the patient utilizing her contact info on her demographic form however she did not answer.   Final Clinical Impression(s) / ED Diagnoses Final diagnoses:  Shortness of breath    Rx / DC Orders ED Discharge Orders          Ordered    albuterol (VENTOLIN HFA) 108 (90 Base) MCG/ACT inhaler  Every 6 hours PRN        06/10/22 1804    predniSONE (DELTASONE) 20 MG tablet  Daily        06/10/22 1804                     Valarie Merino, MD 06/10/22 2310       Azucena Cecil, PA-C 06/11/22 0026    Valarie Merino, MD 06/11/22 2320

## 2022-06-10 NOTE — Discharge Instructions (Addendum)
Please return to the ED with any new or worsening symptoms such as shortness of breath, chest pain Please follow-up with your primary care doctor for further management of your suspected COPD Stop smoking cigarettes. Please read the attached informational guide concerning shortness of breath in adults Please pick up prescription that I have sent in for you. Please begin taking your steroids tomorrow morning.  You will take 40 mg 1 time per day for the next 4 days starting on Sunday. Please read attached guide concerning steps to quit smoking

## 2022-06-10 NOTE — ED Triage Notes (Addendum)
Patient reports that she began having SOB x 2 days Patient also c/o nausea. Patient denies V/D. Patient states her symptoms started after her air conditioning broke.

## 2022-06-10 NOTE — ED Provider Triage Note (Signed)
Emergency Medicine Provider Triage Evaluation Note  Shelly Clark , a 76 y.o. female  was evaluated in triage.  Pt complains of shortness of breath, nausea.  Patient reports that nausea began 3 days ago when her air conditioning unit when out and the shortness of breath began yesterday.  The patient denies any history of COPD or asthma however does have a 100 pack year smoking history (50 year smoker, two packs a day).  Patient denies chest pain, vomiting, diarrhea, abdominal pain, lightheadedness, dizziness or weakness.  Patient has diffuse wheezing on examination.  Review of Systems  Positive:  Negative:   Physical Exam  BP (!) 151/74 (BP Location: Left Arm)   Pulse 70   Temp 98.2 F (36.8 C) (Oral)   Resp 17   Ht 5' (1.524 m)   Wt 58.1 kg   SpO2 95%   BMI 25.00 kg/m  Gen:   Awake, no distress   Resp:  Normal effort  MSK:   Moves extremities without difficulty  Other:    Medical Decision Making  Medically screening exam initiated at 4:48 PM.  Appropriate orders placed.  Lamanda SHATASIA CUTSHAW was informed that the remainder of the evaluation will be completed by another provider, this initial triage assessment does not replace that evaluation, and the importance of remaining in the ED until their evaluation is complete.     Azucena Cecil, PA-C 06/10/22 1651

## 2022-06-11 ENCOUNTER — Encounter (HOSPITAL_BASED_OUTPATIENT_CLINIC_OR_DEPARTMENT_OTHER): Payer: Self-pay | Admitting: Emergency Medicine

## 2022-06-11 ENCOUNTER — Inpatient Hospital Stay (HOSPITAL_BASED_OUTPATIENT_CLINIC_OR_DEPARTMENT_OTHER)
Admission: EM | Admit: 2022-06-11 | Discharge: 2022-06-13 | DRG: 191 | Disposition: A | Discharge status: Home / Self Care | Payer: PPO | Attending: Family Medicine | Admitting: Family Medicine

## 2022-06-11 ENCOUNTER — Emergency Department (HOSPITAL_BASED_OUTPATIENT_CLINIC_OR_DEPARTMENT_OTHER): Payer: PPO

## 2022-06-11 ENCOUNTER — Other Ambulatory Visit: Payer: Self-pay

## 2022-06-11 DIAGNOSIS — Z20822 Contact with and (suspected) exposure to covid-19: Secondary | ICD-10-CM | POA: Diagnosis present

## 2022-06-11 DIAGNOSIS — E1169 Type 2 diabetes mellitus with other specified complication: Secondary | ICD-10-CM | POA: Diagnosis present

## 2022-06-11 DIAGNOSIS — E871 Hypo-osmolality and hyponatremia: Secondary | ICD-10-CM | POA: Diagnosis present

## 2022-06-11 DIAGNOSIS — Z885 Allergy status to narcotic agent status: Secondary | ICD-10-CM | POA: Diagnosis not present

## 2022-06-11 DIAGNOSIS — F17210 Nicotine dependence, cigarettes, uncomplicated: Secondary | ICD-10-CM | POA: Diagnosis not present

## 2022-06-11 DIAGNOSIS — Z888 Allergy status to other drugs, medicaments and biological substances status: Secondary | ICD-10-CM | POA: Diagnosis not present

## 2022-06-11 DIAGNOSIS — Z7951 Long term (current) use of inhaled steroids: Secondary | ICD-10-CM

## 2022-06-11 DIAGNOSIS — M810 Age-related osteoporosis without current pathological fracture: Secondary | ICD-10-CM | POA: Diagnosis present

## 2022-06-11 DIAGNOSIS — Z8249 Family history of ischemic heart disease and other diseases of the circulatory system: Secondary | ICD-10-CM

## 2022-06-11 DIAGNOSIS — Z716 Tobacco abuse counseling: Secondary | ICD-10-CM | POA: Diagnosis not present

## 2022-06-11 DIAGNOSIS — Z7982 Long term (current) use of aspirin: Secondary | ICD-10-CM

## 2022-06-11 DIAGNOSIS — R112 Nausea with vomiting, unspecified: Secondary | ICD-10-CM | POA: Diagnosis present

## 2022-06-11 DIAGNOSIS — E782 Mixed hyperlipidemia: Secondary | ICD-10-CM | POA: Diagnosis not present

## 2022-06-11 DIAGNOSIS — Z83438 Family history of other disorder of lipoprotein metabolism and other lipidemia: Secondary | ICD-10-CM

## 2022-06-11 DIAGNOSIS — J441 Chronic obstructive pulmonary disease with (acute) exacerbation: Principal | ICD-10-CM | POA: Diagnosis present

## 2022-06-11 DIAGNOSIS — I10 Essential (primary) hypertension: Secondary | ICD-10-CM | POA: Diagnosis not present

## 2022-06-11 DIAGNOSIS — N390 Urinary tract infection, site not specified: Secondary | ICD-10-CM

## 2022-06-11 DIAGNOSIS — R8271 Bacteriuria: Secondary | ICD-10-CM | POA: Diagnosis not present

## 2022-06-11 DIAGNOSIS — Z7984 Long term (current) use of oral hypoglycemic drugs: Secondary | ICD-10-CM | POA: Diagnosis not present

## 2022-06-11 DIAGNOSIS — Z7952 Long term (current) use of systemic steroids: Secondary | ICD-10-CM | POA: Diagnosis not present

## 2022-06-11 DIAGNOSIS — N10 Acute tubulo-interstitial nephritis: Secondary | ICD-10-CM

## 2022-06-11 DIAGNOSIS — E861 Hypovolemia: Secondary | ICD-10-CM | POA: Diagnosis not present

## 2022-06-11 DIAGNOSIS — Z833 Family history of diabetes mellitus: Secondary | ICD-10-CM

## 2022-06-11 DIAGNOSIS — Z66 Do not resuscitate: Secondary | ICD-10-CM | POA: Diagnosis present

## 2022-06-11 DIAGNOSIS — Z79899 Other long term (current) drug therapy: Secondary | ICD-10-CM

## 2022-06-11 DIAGNOSIS — E119 Type 2 diabetes mellitus without complications: Secondary | ICD-10-CM

## 2022-06-11 DIAGNOSIS — Z90710 Acquired absence of both cervix and uterus: Secondary | ICD-10-CM

## 2022-06-11 DIAGNOSIS — N3000 Acute cystitis without hematuria: Secondary | ICD-10-CM

## 2022-06-11 LAB — URINALYSIS, ROUTINE W REFLEX MICROSCOPIC
Bilirubin Urine: NEGATIVE
Glucose, UA: 100 mg/dL — AB
Ketones, ur: NEGATIVE mg/dL
Leukocytes,Ua: NEGATIVE
Nitrite: POSITIVE — AB
Protein, ur: >300 mg/dL — AB
Specific Gravity, Urine: 1.027 (ref 1.005–1.030)
pH: 6.5 (ref 5.0–8.0)

## 2022-06-11 LAB — COMPREHENSIVE METABOLIC PANEL
ALT: 31 U/L (ref 0–44)
AST: 33 U/L (ref 15–41)
Albumin: 4.7 g/dL (ref 3.5–5.0)
Alkaline Phosphatase: 63 U/L (ref 38–126)
Anion gap: 14 (ref 5–15)
BUN: 19 mg/dL (ref 8–23)
CO2: 22 mmol/L (ref 22–32)
Calcium: 10 mg/dL (ref 8.9–10.3)
Chloride: 79 mmol/L — ABNORMAL LOW (ref 98–111)
Creatinine, Ser: 0.69 mg/dL (ref 0.44–1.00)
GFR, Estimated: >60 mL/min (ref >60)
Glucose, Bld: 201 mg/dL — ABNORMAL HIGH (ref 70–99)
Potassium: 3.9 mmol/L (ref 3.5–5.1)
Sodium: 115 mmol/L — CL (ref 135–145)
Total Bilirubin: 0.9 mg/dL (ref 0.3–1.2)
Total Protein: 7.7 g/dL (ref 6.5–8.1)

## 2022-06-11 LAB — LACTIC ACID, PLASMA
Lactic Acid, Venous: 1.8 mmol/L (ref 0.5–1.9)
Lactic Acid, Venous: 1.2 mmol/L (ref 0.5–1.9)

## 2022-06-11 LAB — CBC WITH DIFFERENTIAL/PLATELET
Abs Immature Granulocytes: 0.03 10*3/uL (ref 0.00–0.07)
Basophils Absolute: 0 10*3/uL (ref 0.0–0.1)
Basophils Relative: 0 %
Eosinophils Absolute: 0.1 10*3/uL (ref 0.0–0.5)
Eosinophils Relative: 1 %
HCT: 42.3 % (ref 36.0–46.0)
Hemoglobin: 15.3 g/dL — ABNORMAL HIGH (ref 12.0–15.0)
Immature Granulocytes: 0 %
Lymphocytes Relative: 15 %
Lymphs Abs: 1.5 10*3/uL (ref 0.7–4.0)
MCH: 28.8 pg (ref 26.0–34.0)
MCHC: 36.2 g/dL — ABNORMAL HIGH (ref 30.0–36.0)
MCV: 79.5 fL — ABNORMAL LOW (ref 80.0–100.0)
Monocytes Absolute: 0.2 10*3/uL (ref 0.1–1.0)
Monocytes Relative: 2 %
Neutro Abs: 7.9 10*3/uL — ABNORMAL HIGH (ref 1.7–7.7)
Neutrophils Relative %: 82 %
Platelets: 310 10*3/uL (ref 150–400)
RBC: 5.32 MIL/uL — ABNORMAL HIGH (ref 3.87–5.11)
RDW: 12.2 % (ref 11.5–15.5)
WBC: 9.7 10*3/uL (ref 4.0–10.5)
nRBC: 0 % (ref 0.0–0.2)

## 2022-06-11 LAB — OSMOLALITY, URINE: Osmolality, Ur: 601 mOsm/kg (ref 300–900)

## 2022-06-11 LAB — BASIC METABOLIC PANEL
Anion gap: 13 (ref 5–15)
BUN: 15 mg/dL (ref 8–23)
CO2: 19 mmol/L — ABNORMAL LOW (ref 22–32)
Calcium: 8.3 mg/dL — ABNORMAL LOW (ref 8.9–10.3)
Chloride: 88 mmol/L — ABNORMAL LOW (ref 98–111)
Creatinine, Ser: 0.38 mg/dL — ABNORMAL LOW (ref 0.44–1.00)
GFR, Estimated: >60 mL/min (ref >60)
Glucose, Bld: 161 mg/dL — ABNORMAL HIGH (ref 70–99)
Potassium: 3.7 mmol/L (ref 3.5–5.1)
Sodium: 120 mmol/L — ABNORMAL LOW (ref 135–145)

## 2022-06-11 LAB — GLUCOSE, CAPILLARY: Glucose-Capillary: 162 mg/dL — ABNORMAL HIGH (ref 70–99)

## 2022-06-11 LAB — TROPONIN I (HIGH SENSITIVITY)
Troponin I (High Sensitivity): 3 ng/L (ref <18)
Troponin I (High Sensitivity): 3 ng/L (ref <18)

## 2022-06-11 LAB — AMMONIA: Ammonia: 56 umol/L — ABNORMAL HIGH (ref 9–35)

## 2022-06-11 LAB — SODIUM: Sodium: 119 mmol/L — CL (ref 135–145)

## 2022-06-11 LAB — OSMOLALITY: Osmolality: 251 mOsm/kg — ABNORMAL LOW (ref 275–295)

## 2022-06-11 LAB — SODIUM, URINE, RANDOM: Sodium, Ur: 20 mmol/L

## 2022-06-11 LAB — SARS CORONAVIRUS 2 BY RT PCR: SARS Coronavirus 2 by RT PCR: NEGATIVE

## 2022-06-11 MED ORDER — INSULIN ASPART 100 UNIT/ML IJ SOLN
0.0000 [IU] | Freq: Three times a day (TID) | INTRAMUSCULAR | Status: DC
  Administered 2022-06-12 (×2): 2 [IU] via SUBCUTANEOUS
  Administered 2022-06-12 – 2022-06-13 (×2): 1 [IU] via SUBCUTANEOUS

## 2022-06-11 MED ORDER — ORAL CARE MOUTH RINSE: 15.0000 mL | OROMUCOSAL | Status: DC | PRN

## 2022-06-11 MED ORDER — SODIUM CHLORIDE 0.9 % IV SOLN: INTRAVENOUS | Status: DC

## 2022-06-11 MED ORDER — METHYLPREDNISOLONE SODIUM SUCC 40 MG IJ SOLR
40.0000 mg | Freq: Every day | INTRAMUSCULAR | Status: DC
Start: 2022-06-11 — End: 2022-06-12
  Administered 2022-06-11: 40 mg via INTRAVENOUS
  Filled 2022-06-11: qty 1

## 2022-06-11 MED ORDER — SODIUM CHLORIDE 3 % IV SOLN
INTRAVENOUS | Status: DC
  Filled 2022-06-11: qty 500

## 2022-06-11 MED ORDER — SODIUM CHLORIDE 0.9 % IV SOLN
1.0000 g | Freq: Once | INTRAVENOUS | Status: AC
  Administered 2022-06-11: 1 g via INTRAVENOUS
  Filled 2022-06-11: qty 10

## 2022-06-11 MED ORDER — SODIUM CHLORIDE 0.9 % IV SOLN
12.5000 mg | Freq: Four times a day (QID) | INTRAVENOUS | Status: DC | PRN
  Administered 2022-06-12: 12.5 mg via INTRAVENOUS
  Filled 2022-06-11: qty 12.5

## 2022-06-11 MED ORDER — IPRATROPIUM-ALBUTEROL 0.5-2.5 (3) MG/3ML IN SOLN
3.0000 mL | Freq: Four times a day (QID) | RESPIRATORY_TRACT | Status: DC
  Administered 2022-06-11: 3 mL via RESPIRATORY_TRACT
  Filled 2022-06-11: qty 3

## 2022-06-11 MED ORDER — ONDANSETRON HCL 4 MG/2ML IJ SOLN
4.0000 mg | Freq: Four times a day (QID) | INTRAMUSCULAR | Status: DC | PRN
  Administered 2022-06-11: 4 mg via INTRAVENOUS
  Filled 2022-06-11: qty 2

## 2022-06-11 MED ORDER — ENOXAPARIN SODIUM 40 MG/0.4ML IJ SOSY
40.0000 mg | PREFILLED_SYRINGE | INTRAMUSCULAR | Status: DC
  Administered 2022-06-11 – 2022-06-12 (×2): 40 mg via SUBCUTANEOUS
  Filled 2022-06-11 (×2): qty 0.4

## 2022-06-11 MED ORDER — ONDANSETRON HCL 4 MG/2ML IJ SOLN
4.0000 mg | Freq: Once | INTRAMUSCULAR | Status: AC
  Administered 2022-06-11: 4 mg via INTRAVENOUS
  Filled 2022-06-11: qty 2

## 2022-06-11 MED ORDER — ACETAMINOPHEN 500 MG PO TABS
1000.0000 mg | ORAL_TABLET | Freq: Once | ORAL | Status: DC
  Filled 2022-06-11: qty 2

## 2022-06-11 MED ORDER — SODIUM CHLORIDE 0.9 % IV SOLN: Freq: Once | INTRAVENOUS | Status: AC

## 2022-06-11 MED ORDER — CHLORHEXIDINE GLUCONATE CLOTH 2 % EX PADS
6.0000 | MEDICATED_PAD | Freq: Every day | CUTANEOUS | Status: DC
  Administered 2022-06-12 – 2022-06-13 (×3): 6 via TOPICAL

## 2022-06-11 MED ORDER — LEVOFLOXACIN IN D5W 250 MG/50ML IV SOLN
250.0000 mg | INTRAVENOUS | Status: DC
  Filled 2022-06-11: qty 50

## 2022-06-11 MED ORDER — ALBUTEROL SULFATE (2.5 MG/3ML) 0.083% IN NEBU: 2.5000 mg | INHALATION_SOLUTION | RESPIRATORY_TRACT | Status: DC | PRN

## 2022-06-11 MED ORDER — SODIUM CHLORIDE 0.9 % IV BOLUS (SEPSIS)
1000.0000 mL | Freq: Once | INTRAVENOUS | Status: AC
  Administered 2022-06-11: 1000 mL via INTRAVENOUS

## 2022-06-11 NOTE — ED Notes (Signed)
Patient transported to CT 

## 2022-06-11 NOTE — ED Triage Notes (Signed)
Patient brought in for vomiting all night. Patient was seen at [facility] long yesterday. Family reports confusion and shaky onset last night. Patient has tried zofran with no relief.

## 2022-06-11 NOTE — Assessment & Plan Note (Addendum)
Never diagnosed with COPD, no daily symptoms, but long time smoker.   Now with wheezing, cough prior to admission.  CXR clear. - Hold Solu-medrol - Start Dulera - PRN Duo-neb  - No sputum, hold antibiotics

## 2022-06-11 NOTE — Progress Notes (Signed)
Plan of Care Note for accepted transfer   Patient: Shelly Clark MRN: [id number]   [place]ard: 06/11/2022  Facility requesting transfer: [facility]. Requesting Provider: Gareth Morgan, MD. Reason for transfer: Hyponatremia, nausea and vomiting UTI. Facility course:  Per EDP:  " Chief Complaint  Patient presents with   Emesis      Shelly Clark is a 76 y.o. female.   HPI     76 year old female with a history of hypertension, hyperlipidemia, diabetes, tobacco abuse, who was seen last night at [facility] for shortness of breath, and treated for presumed COPD exacerbation in the setting of wheezing and significant smoking history with improvement of her symptoms, who presents with concern for nausea, vomiting, shakiness and confusion which began last night     AC went out on Friday, was overnight in hot house, works in garden 3-4 hours, had nausea and vomiting 3-4 days ago Had been concerned about hand shakiness ofr a few days, daughter hx of nonessential tremors, thought it could be that A few years ago was hospitalized for sepsis  Last few days not doing well Not eating and drinking, vomiting Taking zofran, not sure if more 1.5 weeks ago had vertigo, starting taking zofran then, father did epleys manuevers and feels like that helped  Looked gray yesterday, had dyspnea, was taken to [facility] by son in law No abdominal pain Mild headache No chest pain but did have some shoulder blade pain in back Cramping in legs for about a week, last night was severe Difficulty walking, worsening shaking last night No recent medication changes"  Lab work:   Lactic acid, plasma [[id number]]   Collected: 06/11/22 1240   Updated: 06/11/22 1329   Specimen Type: Blood   Specimen Source: Vein    Lactic Acid, Venous 1.8 mmol/L  Ammonia [[id number]] (Abnormal)   Collected: 06/11/22 1240   Updated: 06/11/22 1329   Specimen Type: Blood   Specimen Source: Vein    Ammonia 56 High  umol/L  SARS Coronavirus 2  by RT PCR (hospital order, performed in [place] hospital lab) *cepheid single result test* Anterior Nasal Swab [[id number]]   Collected: 06/11/22 1240   Updated: 06/11/22 1328   Specimen Source: Anterior Nasal Swab    SARS Coronavirus 2 by RT PCR NEGATIVE  Troponin I (High Sensitivity) [[id number]]   Collected: 06/11/22 1238   Updated: 06/11/22 1319   Specimen Source: Vein    Troponin I (High Sensitivity) 3 ng/L  Blood culture (routine x 2) [[id number]]   Collected: 06/11/22 1230   Updated: 06/11/22 1309   Specimen Type: Blood   Specimen Source: Peripheral   Blood culture (routine x 2) [[id number]]   Collected: 06/11/22 1250   Updated: 06/11/22 1308   Specimen Type: Blood   Specimen Source: Vein   Osmolality [[id number]]   Collected: 06/11/22 1240   Updated: 06/11/22 1247   Specimen Type: Blood   Specimen Source: Vein   Urine Culture [[id number]]   Collected: 06/11/22 1216   Updated: 06/11/22 1226   Specimen Source: Urine, Clean Catch   Osmolality, urine [[id number]]   Collected: 06/11/22 1213   Updated: 06/11/22 1219   Specimen Type: Urine   Sodium, urine, random [[id number]]   Collected: 06/11/22 1213   Updated: 06/11/22 1217   Specimen Type: Urine   Comprehensive metabolic panel [[id number]] (Abnormal)   Collected: 06/11/22 1052   Updated: 06/11/22 1207   Specimen Type: Blood   Specimen Source: Vein    Sodium 115 Low Panic  mmol/L   Potassium 3.9 mmol/L   Chloride 79 Low  mmol/L   CO2 22 mmol/L   Glucose, Bld 201 High  mg/dL   BUN 19 mg/dL   Creatinine, Ser 0.69 mg/dL   Calcium 10.0 mg/dL   Total Protein 7.7 g/dL   Albumin 4.7 g/dL   AST 33 U/L   ALT 31 U/L   Alkaline Phosphatase 63 U/L   Total Bilirubin 0.9 mg/dL   GFR, Estimated >60 mL/min   Anion gap 14  Troponin I (High Sensitivity) [[id number]]   Collected: 06/11/22 1052   Updated: 06/11/22 1204   Specimen Source: Vein    Troponin I (High Sensitivity) 3 ng/L  Urinalysis, Routine w reflex microscopic  Urine, Clean Catch [[id number]] (Abnormal)   Collected: 06/11/22 1052   Updated: 06/11/22 1144   Specimen Source: Urine, Clean Catch    Color, Urine YELLOW   APPearance CLEAR   Specific Gravity, Urine 1.027   pH 6.5   Glucose, UA 100 Abnormal  mg/dL   Hgb urine dipstick LARGE Abnormal    Bilirubin Urine NEGATIVE   Ketones, ur NEGATIVE mg/dL   Protein, ur >300 Abnormal  mg/dL   Nitrite POSITIVE Abnormal    Leukocytes,Ua NEGATIVE   RBC / HPF 11-20 RBC/hpf   WBC, UA 0-5 WBC/hpf   Bacteria, UA MANY Abnormal    Squamous Epithelial / LPF 0-5   Hyaline Casts, UA PRESENT  CBC with Differential [[id number]] (Abnormal)   Collected: 06/11/22 1052   Updated: 06/11/22 1134   Specimen Type: Blood   Specimen Source: Vein    WBC 9.7 K/uL   RBC 5.32 High  MIL/uL   Hemoglobin 15.3 High  g/dL   HCT 42.3 %   MCV 79.5 Low  fL   MCH 28.8 pg   MCHC 36.2 High  g/dL   RDW 12.2 %   Platelets 310 K/uL   nRBC 0.0 %   Neutrophils Relative % 82 %   Neutro Abs 7.9 High  K/uL   Lymphocytes Relative 15 %   Lymphs Abs 1.5 K/uL   Monocytes Relative 2 %   Monocytes Absolute 0.2 K/uL   Eosinophils Relative 1 %   Eosinophils Absolute 0.1 K/uL   Basophils Relative 0 %   Basophils Absolute 0.0 K/uL   Immature Granulocytes 0 %   Abs Immature Granulocytes 0.03 K/uL   Imaging: CT head with no acute abnormalities.  Chest radiograph from yesterday with no acute intrathoracic process.  Plan of care: The patient is accepted for admission to Progressive unit, at [facility].   Author: Reubin Milan, MD 06/11/2022  Check [email or web address] for on-call coverage.  Nursing staff, Please call [place] number on Amion as soon as patient's arrival, so appropriate admitting provider can evaluate the pt.

## 2022-06-11 NOTE — Assessment & Plan Note (Addendum)
Secondary to hypovolemic hyponatremia.  Osmolality of 251, urine osmole 601, urine sodium of 20 suggest hypovolemia. -Her repeat sodium following 1 L of normal saline has already improved from 115 to 120.  We will continue low 50 cc IV normal saline fluid overnight and follow sodium levels q4hr to avoid overcorrect -Hold home HCTZ.

## 2022-06-11 NOTE — Assessment & Plan Note (Addendum)
Last hemoglobin A1c in May 2023 of 6.3, glucose okay ovenright - Continue SS corrections

## 2022-06-11 NOTE — H&P (Incomplete)
History and Physical    Patient: Shelly Clark ESP:[id number] DOB: 09/22/1946 DOA: 06/11/2022 DOS: the patient was seen and examined on 06/12/2022 PCP: [name], NP  Patient coming from: [facility]  Chief Complaint:  Chief Complaint  Patient presents with   Emesis   HPI: Shelly Clark is a 76 y.o. female with medical history significant of HTN, HLD, T2DM, who presents from outside ED with hyponatremia.  Patient reports her AC went out last week and then for the past 3 days has had nausea, persistent vomiting with diffuse abdominal pain.  She is also noticed increasing shortness of breath, wheezing and cough.  She has ongoing tobacco use for a pack and 1/2/day. She has continued to take her chronic medications including HCTZ.   She was seen in the ER yesterday for the same symptoms.  She was treated with DuoNebs for presumed undiagnosed COPD with improvement in symptoms and was discharged home.  At that time her sodium was low at 123 but she requested to be discharged before her lab resulted.  Today her sodium is significantly decreased further to 115 with glucose of 201.  She has noticed increasing calf cramp and upper extremity tremors.  States she took prednisone prescribed yesterday and had worsening nausea and vomiting.  In the ED, she was otherwise afebrile, mildly tachypneic and hypertensive up to SBP of 180s on room air. No AKI.  No leukocytosis. UA was positive with nitrite, negative leukocyte and many bacteria.  CT head with no acute intracranial finding.  She was given IV Rocephin in the ED but family has noticed some increasing facial and neck redness shortly after.  Review of Systems: As mentioned in the history of present illness. All other systems reviewed and are negative. Past Medical History:  Diagnosis Date   Benign hematuria    neg urology workup   Diabetes mellitus without complication ([place])    Esophageal dysmotility     Hyperlipidemia    Hypertension    Migraines    Nicotine dependence, cigarettes, uncomplicated 63/33/5456   Osteoporosis    Tobacco abuse    Past Surgical History:  Procedure Laterality Date   ABDOMINAL HYSTERECTOMY     MOUTH SURGERY     Social History:  reports that she has been smoking cigarettes. She has a 80.00 pack-year smoking history. She has never used smokeless tobacco. She reports that she does not drink alcohol and does not use drugs.  Allergies  Allergen Reactions   Codeine Nausea And Vomiting   Phenylpropanolamine Nausea And Vomiting and Other (See Comments)    "Tavist-D"   Prednisone Nausea Only and Other (See Comments)    Can tolerate via IV, but not orally   Tavist [Clemastine] Nausea And Vomiting    "Tavist-D"   Nicoderm [Nicotine] Rash and Other (See Comments)    Patient said she is ALLERGIC to the adhesive    Family History  Problem Relation Age of Onset   Ovarian cancer Mother    Diabetes Mother    Hypertension Mother    Hyperlipidemia Mother    Diabetes Father    Hypertension Father    Hyperlipidemia Father    Hypertension Brother    Hypertension Daughter    Hypertension Daughter     Prior to Admission medications   Medication Sig Start Date End Date Taking? Authorizing Provider  albuterol (VENTOLIN HFA) 108 (90 Base) MCG/ACT inhaler Inhale 1-2 puffs into the lungs every 6 (six) hours as needed for wheezing  or shortness of breath. 06/10/22   Maudie Flakes, MD  amLODipine (NORVASC) 10 MG tablet TAKE 1 TABLET(10 MG) BY MOUTH DAILY 05/06/21   Nafziger, Tommi Rumps, NP  amoxicillin-clavulanate (AUGMENTIN) 875-125 MG tablet Take 1 tablet by mouth 2 (two) times daily. 04/11/22   Nafziger, Tommi Rumps, NP  aspirin EC 81 MG tablet Take 81 mg by mouth daily.    [provider]  B Complex-C (B-COMPLEX WITH VITAMIN C) tablet Take 1 tablet by mouth daily.    [provider]  budesonide-formoterol (SYMBICORT) 80-4.5 MCG/ACT inhaler Inhale 2 puffs into the  lungs in the morning and at bedtime. 11/14/20   Eugenie Filler, MD  clonazePAM (KLONOPIN) 0.5 MG tablet Take 1 tablet (0.5 mg total) by mouth at bedtime as needed. for sleep 01/31/22   [name], NP  FLUoxetine (PROZAC) 20 MG tablet Take 1 tablet (20 mg total) by mouth daily. 01/31/22   Nafziger, Tommi Rumps, NP  glipiZIDE (GLUCOTROL XL) 5 MG 24 hr tablet TAKE 1 TABLET(5 MG) BY MOUTH DAILY WITH BREAKFAST 11/29/21   Nafziger, Tommi Rumps, NP  hydrochlorothiazide (HYDRODIURIL) 25 MG tablet Take 1 tablet (25 mg total) by mouth daily. 08/30/21   Nafziger, Tommi Rumps, NP  lisinopril (ZESTRIL) 40 MG tablet Take 1 tablet (40 mg total) by mouth daily. 08/30/21   Nafziger, Tommi Rumps, NP  metFORMIN (GLUCOPHAGE) 500 MG tablet Take 500 mg by mouth 2 (two) times daily with a meal.    [provider]  Multiple Vitamin (MULTIVITAMIN WITH MINERALS) TABS Take 1 tablet by mouth daily.    [provider]  nicotine (NICODERM CQ) 21 mg/24hr patch Place 1 patch (21 mg total) onto the skin daily. 05/09/22   Nafziger, Tommi Rumps, NP  ondansetron (ZOFRAN) 4 MG tablet Take 1 tablet (4 mg total) by mouth every 8 (eight) hours as needed for nausea or vomiting. 02/14/22   Nafziger, Tommi Rumps, NP  pantoprazole (PROTONIX) 20 MG tablet Take 1 tablet (20 mg total) by mouth daily. 11/08/20   Wardell Honour, MD  predniSONE (DELTASONE) 20 MG tablet Take 2 tablets (40 mg total) by mouth daily. 06/10/22   Azucena Cecil, PA-C  simvastatin (ZOCOR) 20 MG tablet Take 1 tablet (20 mg total) by mouth daily. 08/30/21   Nafziger, Tommi Rumps, NP  triamcinolone ointment (KENALOG) 0.5 % APPLY TOPICALLY TO THE AFFECTED AREA TWICE DAILY 12/07/21   [name], NP    Physical Exam: Vitals:   06/11/22 2109 06/11/22 2148 06/12/22 0030 06/12/22 0037  BP: (!) 159/77   (!) 177/75  Pulse: 87 85  84  Resp: 20 (!) 22  (!) 23  Temp: 97.6 F (36.4 C)  97.8 F (36.6 C)   TempSrc:   Oral   SpO2: 95% 98%  96%  Weight:      Height:       Constitutional: NAD, calm,  comfortable, elderly female appearing younger than stated age laying at approximately 20 degree incline in bed Eyes:  lids and conjunctivae normal ENMT: Mucous membranes are moist.  Neck: normal, supple Respiratory: Diffuse expiratory wheezes but no crackles.  Has intermittent dyspnea with exertion.  Stable on room air.  No accessory muscle use.   Cardiovascular: Regular rate and rhythm, no murmurs / rubs / gallops. No extremity edema.   Abdomen: Soft, no tenderness,  Bowel sounds positive.  Musculoskeletal: no clubbing / cyanosis. No joint deformity upper and lower extremities.  Skin: Has facial erythema and also to anterior cervical region. Neurologic: CN 2-12 grossly intact. Psychiatric: Normal  judgment and insight. Alert and oriented x 3. Normal mood. Data Reviewed:  See HPI  Assessment and Plan: * Hyponatremia Secondary to hypovolemic hyponatremia from vomiting.  Osmolality of 251, urine osmole 601, urine sodium of 20 suggest hypovolemia. -Her repeat sodium following 1 L of normal saline has already improved from 115 to 120. Had trial low 50 cc IV normal saline fluid but Na downward trended to 119. Will move her to stepdown unit and start 3% hypertonic saline and follow sodium levels q4hr to avoid overcorrect -Hold home HCTZ.   Mixed hyperlipidemia due to type 2 diabetes mellitus (HCC) Continue statin  COPD with acute exacerbation (HCC) - No formal diagnosis of COPD but has chronic tobacco use up to a pack and half per day.  Has noticed increased wheezing cough and dyspnea. -DuoNeb scheduled q6hr -IV solu medrol '40mg'$  daily -Already on IV Levaquin for UTI  UTI (urinary tract infection) -pt potentially had mild allergic reaction IV Rocephin.  Will switch to IV Levaquin which will cover for both for UTI and COPD exacerbation  Type 2 diabetes mellitus, controlled ([place]) Last hemoglobin A1c in May 2023 of 6.3 -Has hyperglycemia on presentation of 201.  Start sensitive sliding scale  insulin.  Essential hypertension Continue Lisinopril and amlodipine. Hold HCTZ due to hyponatremia      Advance Care Planning:   Code Status: DNR DNR-confirmed with patient  Consults: None  Family Communication: Gust with daughter and granddaughter at bedside  Severity of Illness: The appropriate patient status for this patient is OBSERVATION. Observation status is judged to be reasonable and necessary in order to provide the required intensity of service to ensure the patient's safety. The patient's presenting symptoms, physical exam findings, and initial radiographic and laboratory data in the context of their medical condition is felt to place them at decreased risk for further clinical deterioration. Furthermore, it is anticipated that the patient will be medically stable for discharge from the hospital within 2 midnights of admission.   Author: Orene Desanctis, DO 06/12/2022 1:59 AM  For on call review [email or web address].

## 2022-06-11 NOTE — ED Provider Notes (Signed)
[facility] EMERGENCY DEPT Provider Note   CSN: [id number] Arrival date & time: 06/11/22  1019     History  Chief Complaint  Patient presents with   Emesis    Shelly Clark is a 76 y.o. female.  HPI     76 year old female with a history of hypertension, hyperlipidemia, diabetes, tobacco abuse, who was seen last night at [facility] for shortness of breath, and treated for presumed COPD exacerbation in the setting of wheezing and significant smoking history with improvement of her symptoms, who presents with concern for nausea, vomiting, shakiness and confusion which began last night   AC went out on Friday, was overnight in hot house, works in garden 3-4 hours, had nausea and vomiting 3-4 days ago Had been concerned about hand shakiness ofr a few days, daughter hx of nonessential tremors, thought it could be that A few years ago was hospitalized for sepsis  Last few days not doing well Not eating and drinking, vomiting Taking zofran, not sure if more 1.5 weeks ago had vertigo, starting taking zofran then, father did epleys manuevers and feels like that helped  Looked gray yesterday, had dyspnea, was [facility] to [facility] by son in law No abdominal pain Mild headache No chest pain but did have some shoulder blade pain in back Cramping in legs for about a week, last night was severe Difficulty walking, worsening shaking last night No recent medication changes   Past Medical History:  Diagnosis Date   Benign hematuria    neg urology workup   Diabetes mellitus without complication ([place])    Esophageal dysmotility    Hyperlipidemia    Hypertension    Migraines    Nicotine dependence, cigarettes, uncomplicated 47/42/5956   Osteoporosis    Tobacco abuse      Home Medications Prior to Admission medications   Medication Sig Start Date End Date Taking? Authorizing Provider  albuterol (VENTOLIN HFA) 108 (90 Base) MCG/ACT inhaler Inhale 1-2 puffs into the lungs every  6 (six) hours as needed for wheezing or shortness of breath. 06/10/22  Yes Maudie Flakes, MD  amLODipine (NORVASC) 10 MG tablet TAKE 1 TABLET(10 MG) BY MOUTH DAILY Patient taking differently: Take 10 mg by mouth daily. 05/06/21  Yes Nafziger, Tommi Rumps, NP  B Complex-C (B-COMPLEX WITH VITAMIN C) tablet Take 1 tablet by mouth daily.   Yes [provider]  clonazePAM (KLONOPIN) 0.5 MG tablet Take 1 tablet (0.5 mg total) by mouth at bedtime as needed. for sleep Patient taking differently: Take 0.5 mg by mouth daily as needed for anxiety (or sleep). 01/31/22  Yes Nafziger, Tommi Rumps, NP  FLUoxetine (PROZAC) 20 MG capsule Take 20 mg by mouth daily. 05/04/22  Yes [provider]  glipiZIDE (GLUCOTROL XL) 5 MG 24 hr tablet TAKE 1 TABLET(5 MG) BY MOUTH DAILY WITH BREAKFAST Patient taking differently: Take 5 mg by mouth daily with breakfast. 11/29/21  Yes Nafziger, Tommi Rumps, NP  hydrochlorothiazide (HYDRODIURIL) 25 MG tablet Take 1 tablet (25 mg total) by mouth daily. 08/30/21  Yes Nafziger, Tommi Rumps, NP  lisinopril (ZESTRIL) 40 MG tablet Take 1 tablet (40 mg total) by mouth daily. 08/30/21  Yes Nafziger, Tommi Rumps, NP  metFORMIN (GLUCOPHAGE) 500 MG tablet Take 500 mg by mouth 2 (two) times daily with a meal.   Yes [provider]  Multiple Vitamin (MULTIVITAMIN WITH MINERALS) TABS Take 1 tablet by mouth daily with breakfast.   Yes [provider]  naproxen (NAPROSYN) 500 MG tablet Take 500 mg by  mouth 2 (two) times daily as needed for mild pain.   Yes [provider]  ondansetron (ZOFRAN) 4 MG tablet Take 1 tablet (4 mg total) by mouth every 8 (eight) hours as needed for nausea or vomiting. 02/14/22  Yes Nafziger, Tommi Rumps, NP  simvastatin (ZOCOR) 20 MG tablet Take 1 tablet (20 mg total) by mouth daily. 08/30/21  Yes Nafziger, Tommi Rumps, NP  amoxicillin-clavulanate (AUGMENTIN) 875-125 MG tablet Take 1 tablet by mouth 2 (two) times daily. Patient not taking: Reported on 06/11/2022 04/11/22   [name],  NP  budesonide-formoterol [facility]) 80-4.5 MCG/ACT inhaler Inhale 2 puffs into the lungs in the morning and at bedtime. Patient not taking: Reported on 06/11/2022 11/14/20   Eugenie Filler, MD  FLUoxetine (PROZAC) 20 MG tablet Take 1 tablet (20 mg total) by mouth daily. Patient not taking: Reported on 06/11/2022 01/31/22   [name], NP  nicotine (NICODERM CQ) 21 mg/24hr patch Place 1 patch (21 mg total) onto the skin daily. Patient not taking: Reported on 06/11/2022 05/09/22   [name], NP  pantoprazole (PROTONIX) 20 MG tablet Take 1 tablet (20 mg total) by mouth daily. Patient not taking: Reported on 06/11/2022 11/08/20   Wardell Honour, MD  predniSONE (DELTASONE) 20 MG tablet Take 2 tablets (40 mg total) by mouth daily. Patient not taking: Reported on 06/11/2022 06/10/22   Azucena Cecil, PA-C  triamcinolone ointment (KENALOG) 0.5 % APPLY TOPICALLY TO THE AFFECTED AREA TWICE DAILY Patient not taking: Reported on 06/11/2022 12/07/21   [name], NP      Allergies    Codeine, Phenylpropanolamine, Prednisone, Tavist [clemastine], and Nicoderm [nicotine]    Review of Systems   Review of Systems  Physical Exam Updated Vital Signs BP (!) 159/77 (BP Location: Right Arm)   Pulse 85   Temp 97.6 F (36.4 C)   Resp (!) 22   Ht 5' (1.524 m)   Wt 58.1 kg   SpO2 98%   BMI 25.00 kg/m  Physical Exam Vitals and nursing note reviewed.  Constitutional:      General: She is not in acute distress.    Appearance: She is well-developed. She is not diaphoretic.  HENT:     Head: Normocephalic and atraumatic.  Eyes:     Conjunctiva/sclera: Conjunctivae normal.  Cardiovascular:     Rate and Rhythm: Normal rate and regular rhythm.     Heart sounds: Normal heart sounds. No murmur heard.    No friction rub. No gallop.  Pulmonary:     Effort: Pulmonary effort is normal. No respiratory distress.     Breath sounds: Normal breath sounds. No wheezing or rales.  Abdominal:      General: There is no distension.     Palpations: Abdomen is soft.     Tenderness: There is no abdominal tenderness. There is no guarding.  Musculoskeletal:        General: No tenderness.     Cervical back: Normal range of motion.  Skin:    General: Skin is warm and dry.     Findings: No erythema or rash.  Neurological:     Mental Status: She is alert and oriented to person, place, and time.     ED Results / Procedures / Treatments   Labs (all labs ordered are listed, but only abnormal results are displayed) Labs Reviewed  CBC WITH DIFFERENTIAL/PLATELET - Abnormal; Notable for the following components:      Result Value   RBC 5.32 (*)  Hemoglobin 15.3 (*)    MCV 79.5 (*)    MCHC 36.2 (*)    Neutro Abs 7.9 (*)    All other components within normal limits  COMPREHENSIVE METABOLIC PANEL - Abnormal; Notable for the following components:   Sodium 115 (*)    Chloride 79 (*)    Glucose, Bld 201 (*)    All other components within normal limits  URINALYSIS, ROUTINE W REFLEX MICROSCOPIC - Abnormal; Notable for the following components:   Glucose, UA 100 (*)    Hgb urine dipstick LARGE (*)    Protein, ur >300 (*)    Nitrite POSITIVE (*)    Bacteria, UA MANY (*)    All other components within normal limits  OSMOLALITY - Abnormal; Notable for the following components:   Osmolality 251 (*)    All other components within normal limits  AMMONIA - Abnormal; Notable for the following components:   Ammonia 56 (*)    All other components within normal limits  BASIC METABOLIC PANEL - Abnormal; Notable for the following components:   Sodium 120 (*)    Chloride 88 (*)    CO2 19 (*)    Glucose, Bld 161 (*)    Creatinine, Ser 0.38 (*)    Calcium 8.3 (*)    All other components within normal limits  SODIUM - Abnormal; Notable for the following components:   Sodium 119 (*)    All other components within normal limits  GLUCOSE, CAPILLARY - Abnormal; Notable for the following components:    Glucose-Capillary 162 (*)    All other components within normal limits  SARS CORONAVIRUS 2 BY RT PCR  CULTURE, BLOOD (ROUTINE X 2)  CULTURE, BLOOD (ROUTINE X 2)  URINE CULTURE  MRSA NEXT GEN BY PCR, NASAL  SODIUM, URINE, RANDOM  OSMOLALITY, URINE  LACTIC ACID, PLASMA  LACTIC ACID, PLASMA  SODIUM  BASIC METABOLIC PANEL  TROPONIN I (HIGH SENSITIVITY)  TROPONIN I (HIGH SENSITIVITY)    EKG EKG Interpretation  Date/Time:  Sunday June 11 2022 11:26:08 EDT Ventricular Rate:  79 PR Interval:  200 QRS Duration: 87 QT Interval:  416 QTC Calculation: 477 R Axis:   -23 Text Interpretation: Sinus rhythm Probable left atrial enlargement Borderline left axis deviation No significant change since last tracing Confirmed by Gareth Morgan [phone]) on 06/11/2022 12:14:48 PM  Radiology CT Head Wo Contrast  Result Date: 06/11/2022 CLINICAL DATA:  Altered mental status.  Vomiting and confusion. EXAM: CT HEAD WITHOUT CONTRAST TECHNIQUE: Contiguous axial images were obtained from the base of the skull through the vertex without intravenous contrast. RADIATION DOSE REDUCTION: This exam was performed according to the departmental dose-optimization program which includes automated exposure control, adjustment of the mA and/or kV according to patient size and/or use of iterative reconstruction technique. COMPARISON:  None Available. FINDINGS: Brain: Mild diffuse white matter hypoattenuation is present. No acute infarct, hemorrhage, or mass lesion is present. Basal ganglia are intact. Insular ribbon is normal bilaterally. No acute or focal cortical abnormalities are present. The ventricles are of normal size. No significant extraaxial fluid collection is present. Vascular: No hyperdense vessel or unexpected calcification. Skull: Calvarium is intact. No focal lytic or blastic lesions are present. No significant extracranial soft tissue lesion is present. Sinuses/Orbits: The paranasal sinuses and mastoid air  cells are clear. The globes and orbits are within normal limits. IMPRESSION: 1. Mild diffuse white matter hypoattenuation. This likely reflects the sequela of chronic microvascular ischemia. 2. No acute intracranial abnormality. Electronically Signed  By: San Morelle M.D.   On: 06/11/2022 11:26   DG Chest 2 View  Result Date: 06/10/2022 CLINICAL DATA:  Short of breath, nausea for 3 days EXAM: CHEST - 2 VIEW COMPARISON:  01/27/2021 FINDINGS: Frontal and lateral views of the chest demonstrate an unremarkable cardiac silhouette. No acute airspace disease, effusion, or pneumothorax. No acute bony abnormalities. IMPRESSION: 1. No acute intrathoracic process. Electronically Signed   By: Randa Ngo M.D.   On: 06/10/2022 17:17    Procedures .Critical Care  Performed by: Gareth Morgan, MD Authorized by: Gareth Morgan, MD   Critical care provider statement:    Critical care time (minutes):  30   Critical care was time spent personally by me on the following activities:  Development of treatment plan with patient or surrogate, discussions with consultants, evaluation of patient's response to treatment, examination of patient, ordering and review of laboratory studies, ordering and review of radiographic studies, ordering and performing treatments and interventions, pulse oximetry, re-evaluation of patient's condition and review of old charts     Medications Ordered in ED Medications  acetaminophen (TYLENOL) tablet 1,000 mg (1,000 mg Oral Not Given 06/11/22 1509)  enoxaparin (LOVENOX) injection 40 mg (40 mg Subcutaneous Given 06/11/22 2108)  ipratropium-albuterol (DUONEB) 0.5-2.5 (3) MG/3ML nebulizer solution 3 mL (3 mLs Nebulization Given 06/11/22 2148)  methylPREDNISolone sodium succinate (SOLU-MEDROL) 40 mg/mL injection 40 mg (40 mg Intravenous Given 06/11/22 2108)  Levofloxacin (LEVAQUIN) IVPB 250 mg (has no administration in time range)  insulin aspart (novoLOG) injection 0-9 Units  (has no administration in time range)  ondansetron (ZOFRAN) injection 4 mg (4 mg Intravenous Given 06/11/22 2116)  albuterol (PROVENTIL) (2.5 MG/3ML) 0.083% nebulizer solution 2.5 mg (has no administration in time range)  sodium chloride (hypertonic) 3 % solution (has no administration in time range)  Chlorhexidine Gluconate Cloth 2 % PADS 6 each (has no administration in time range)  Oral care mouth rinse (has no administration in time range)  cefTRIAXone (ROCEPHIN) 1 g in sodium chloride 0.9 % 100 mL IVPB (1 g Intravenous New Bag/Given 06/11/22 1246)  sodium chloride 0.9 % bolus 1,000 mL (1,000 mLs Intravenous New Bag/Given 06/11/22 1246)  0.9 %  sodium chloride infusion ( Intravenous New Bag/Given 06/11/22 1403)  ondansetron (ZOFRAN) injection 4 mg (4 mg Intravenous Given 06/11/22 1446)    ED Course/ Medical Decision Making/ A&P                            76 year old female with a history of hypertension, hyperlipidemia, diabetes, tobacco abuse, who was seen last night at [facility] for shortness of breath, and treated for presumed COPD exacerbation in the setting of wheezing and significant smoking history with improvement of her symptoms, who presents with concern for nausea, vomiting, shakiness and confusion which began last night.  Differential diagnosis includes anemia, electrolyte abnormality, cardiac abnormality, infection, hypothyroidism, other toxic/metabolic abnormalities.  CXR yesterday without signs of pneumonia. CBC showed no sign of anemia.  EKG was not changed from prior, patient does not have chest pain, and troponin negative and have low suspicion for cardiac etiology of symptoms.  CT head completed and evaluated by me without acute ICH or other abnormalities.    Sodium yesterday 123, is 115 today.  Suspect likely hypovolemic hyponatremia in setting of hctz, decreased po and nausea/vomiting.  Given bolus of NS, started on 100cc/hr.  Ordered urine/serum oxm. No seizure, no  significant AMS, feel NS is appropriate  for treatment at this time.  UA consistent with UTI, given rocephin, IV fluids. Lactic acid WNL.   Will admit for significant hyponatremia and UTI. At this time suspect symptoms secondary to hyponatremia, however if they do not improve with treatment would consider other etiologies of tremor.        Final Clinical Impression(s) / ED Diagnoses Final diagnoses:  Hyponatremia  Nausea and vomiting, unspecified vomiting type  Acute pyelonephritis    Rx / DC Orders ED Discharge Orders     None         Gareth Morgan, MD 06/11/22 2324

## 2022-06-11 NOTE — ED Notes (Signed)
CRITICAL VALUE STICKER  CRITICAL VALUE:Na+ 115  RECEIVER (on-site recipient of call):Shawnie Pons, RN  DATE & TIME NOTIFIED:   MESSENGER (representative from lab):  MD NOTIFIED: Dr. Billy Fischer  TIME OF NOTIFICATION:1207  RESPONSE:

## 2022-06-11 NOTE — Assessment & Plan Note (Signed)
Had potentially allergic reaction to Rocephin.   - Hold Levaquin - Follow urine culture

## 2022-06-12 ENCOUNTER — Observation Stay (HOSPITAL_COMMUNITY): Payer: PPO

## 2022-06-12 DIAGNOSIS — F17210 Nicotine dependence, cigarettes, uncomplicated: Secondary | ICD-10-CM | POA: Diagnosis present

## 2022-06-12 DIAGNOSIS — Z885 Allergy status to narcotic agent status: Secondary | ICD-10-CM | POA: Diagnosis not present

## 2022-06-12 DIAGNOSIS — R8271 Bacteriuria: Secondary | ICD-10-CM | POA: Diagnosis present

## 2022-06-12 DIAGNOSIS — Z8249 Family history of ischemic heart disease and other diseases of the circulatory system: Secondary | ICD-10-CM | POA: Diagnosis not present

## 2022-06-12 DIAGNOSIS — Z83438 Family history of other disorder of lipoprotein metabolism and other lipidemia: Secondary | ICD-10-CM | POA: Diagnosis not present

## 2022-06-12 DIAGNOSIS — Z888 Allergy status to other drugs, medicaments and biological substances status: Secondary | ICD-10-CM | POA: Diagnosis not present

## 2022-06-12 DIAGNOSIS — E782 Mixed hyperlipidemia: Secondary | ICD-10-CM | POA: Diagnosis present

## 2022-06-12 DIAGNOSIS — Z716 Tobacco abuse counseling: Secondary | ICD-10-CM | POA: Diagnosis not present

## 2022-06-12 DIAGNOSIS — I10 Essential (primary) hypertension: Secondary | ICD-10-CM | POA: Diagnosis present

## 2022-06-12 DIAGNOSIS — E1169 Type 2 diabetes mellitus with other specified complication: Secondary | ICD-10-CM | POA: Diagnosis present

## 2022-06-12 DIAGNOSIS — Z7952 Long term (current) use of systemic steroids: Secondary | ICD-10-CM | POA: Diagnosis not present

## 2022-06-12 DIAGNOSIS — Z79899 Other long term (current) drug therapy: Secondary | ICD-10-CM | POA: Diagnosis not present

## 2022-06-12 DIAGNOSIS — Z7951 Long term (current) use of inhaled steroids: Secondary | ICD-10-CM | POA: Diagnosis not present

## 2022-06-12 DIAGNOSIS — Z20822 Contact with and (suspected) exposure to covid-19: Secondary | ICD-10-CM | POA: Diagnosis present

## 2022-06-12 DIAGNOSIS — Z90710 Acquired absence of both cervix and uterus: Secondary | ICD-10-CM | POA: Diagnosis not present

## 2022-06-12 DIAGNOSIS — Z7984 Long term (current) use of oral hypoglycemic drugs: Secondary | ICD-10-CM | POA: Diagnosis not present

## 2022-06-12 DIAGNOSIS — E861 Hypovolemia: Secondary | ICD-10-CM | POA: Diagnosis present

## 2022-06-12 DIAGNOSIS — Z833 Family history of diabetes mellitus: Secondary | ICD-10-CM | POA: Diagnosis not present

## 2022-06-12 DIAGNOSIS — I739 Peripheral vascular disease, unspecified: Secondary | ICD-10-CM

## 2022-06-12 DIAGNOSIS — R112 Nausea with vomiting, unspecified: Secondary | ICD-10-CM | POA: Diagnosis present

## 2022-06-12 DIAGNOSIS — Z66 Do not resuscitate: Secondary | ICD-10-CM | POA: Diagnosis present

## 2022-06-12 DIAGNOSIS — E871 Hypo-osmolality and hyponatremia: Secondary | ICD-10-CM | POA: Diagnosis present

## 2022-06-12 DIAGNOSIS — Z7982 Long term (current) use of aspirin: Secondary | ICD-10-CM | POA: Diagnosis not present

## 2022-06-12 DIAGNOSIS — J441 Chronic obstructive pulmonary disease with (acute) exacerbation: Secondary | ICD-10-CM | POA: Diagnosis present

## 2022-06-12 DIAGNOSIS — M810 Age-related osteoporosis without current pathological fracture: Secondary | ICD-10-CM | POA: Diagnosis present

## 2022-06-12 LAB — BASIC METABOLIC PANEL
Anion gap: 11 (ref 5–15)
BUN: 14 mg/dL (ref 8–23)
CO2: 21 mmol/L — ABNORMAL LOW (ref 22–32)
Calcium: 9 mg/dL (ref 8.9–10.3)
Chloride: 93 mmol/L — ABNORMAL LOW (ref 98–111)
Creatinine, Ser: 0.47 mg/dL (ref 0.44–1.00)
GFR, Estimated: >60 mL/min (ref >60)
Glucose, Bld: 163 mg/dL — ABNORMAL HIGH (ref 70–99)
Potassium: 3.8 mmol/L (ref 3.5–5.1)
Sodium: 125 mmol/L — ABNORMAL LOW (ref 135–145)

## 2022-06-12 LAB — SODIUM
Sodium: 120 mmol/L — ABNORMAL LOW (ref 135–145)
Sodium: 128 mmol/L — ABNORMAL LOW (ref 135–145)
Sodium: 128 mmol/L — ABNORMAL LOW (ref 135–145)

## 2022-06-12 LAB — GLUCOSE, CAPILLARY
Glucose-Capillary: 173 mg/dL — ABNORMAL HIGH (ref 70–99)
Glucose-Capillary: 140 mg/dL — ABNORMAL HIGH (ref 70–99)
Glucose-Capillary: 164 mg/dL — ABNORMAL HIGH (ref 70–99)
Glucose-Capillary: 135 mg/dL — ABNORMAL HIGH (ref 70–99)

## 2022-06-12 LAB — MRSA NEXT GEN BY PCR, NASAL: MRSA by PCR Next Gen: NOT DETECTED

## 2022-06-12 MED ORDER — CLONAZEPAM 0.5 MG PO TABS: 0.5000 mg | ORAL_TABLET | Freq: Every day | ORAL | Status: DC | PRN

## 2022-06-12 MED ORDER — MOMETASONE FURO-FORMOTEROL FUM 100-5 MCG/ACT IN AERO
2.0000 | INHALATION_SPRAY | Freq: Two times a day (BID) | RESPIRATORY_TRACT | Status: DC
  Administered 2022-06-12 – 2022-06-13 (×2): 2 via RESPIRATORY_TRACT
  Filled 2022-06-12: qty 8.8

## 2022-06-12 MED ORDER — SIMVASTATIN 10 MG PO TABS
20.0000 mg | ORAL_TABLET | Freq: Every day | ORAL | Status: DC
  Administered 2022-06-12 – 2022-06-13 (×2): 20 mg via ORAL
  Filled 2022-06-12 (×2): qty 2

## 2022-06-12 MED ORDER — AMLODIPINE BESYLATE 10 MG PO TABS
10.0000 mg | ORAL_TABLET | Freq: Every day | ORAL | Status: DC
  Administered 2022-06-12 – 2022-06-13 (×2): 10 mg via ORAL
  Filled 2022-06-12 (×2): qty 1

## 2022-06-12 MED ORDER — IPRATROPIUM-ALBUTEROL 0.5-2.5 (3) MG/3ML IN SOLN
3.0000 mL | Freq: Two times a day (BID) | RESPIRATORY_TRACT | Status: DC
  Administered 2022-06-12 – 2022-06-13 (×3): 3 mL via RESPIRATORY_TRACT
  Filled 2022-06-12 (×3): qty 3

## 2022-06-12 MED ORDER — FLUOXETINE HCL 20 MG PO CAPS
20.0000 mg | ORAL_CAPSULE | Freq: Every day | ORAL | Status: DC
  Administered 2022-06-12 – 2022-06-13 (×2): 20 mg via ORAL
  Filled 2022-06-12 (×2): qty 1

## 2022-06-12 MED ORDER — NAPROXEN 250 MG PO TABS: 500.0000 mg | ORAL_TABLET | Freq: Two times a day (BID) | ORAL | Status: DC | PRN

## 2022-06-12 MED ORDER — LISINOPRIL 10 MG PO TABS
40.0000 mg | ORAL_TABLET | Freq: Every day | ORAL | Status: DC
  Administered 2022-06-12 – 2022-06-13 (×2): 40 mg via ORAL
  Filled 2022-06-12 (×2): qty 4

## 2022-06-12 MED ORDER — SPIRONOLACTONE 25 MG PO TABS
25.0000 mg | ORAL_TABLET | Freq: Every day | ORAL | Status: DC
  Administered 2022-06-12 – 2022-06-13 (×2): 25 mg via ORAL
  Filled 2022-06-12 (×2): qty 1

## 2022-06-12 NOTE — Assessment & Plan Note (Addendum)
Continue simvastatin. 

## 2022-06-12 NOTE — Progress Notes (Signed)
  Progress Note   Patient: Shelly Clark OAC:[id number] DOB: October 21, 1946 DOA: 06/11/2022     0 DOS: the patient was seen and examined on 06/12/2022 at 7:35AM      Brief hospital course: Mrs. Begeman is a 76 y.o. F with DM, HTN, migraines, smoking who presented with few days vomiting, diffuse abdominal pain, shortness of breath and cough.  In the ER, Na 115, blood pressure elevated. Admitted and started on hypertonic saline.     Assessment and Plan: * Hyponatremia In setting of HCTZ, SSRI and proably lung disease.  Worsened by hypovolemia.  Uosm inappropriately high.  Una intermediate. CXR wtihout mass. Na 125 now. - Goal Na 127 at 11am today, 0.5 meq/L/hr - Hold 3% saline - Continue q4h Na checks - Hold home HCTZ - Will continue SSRI for now  COPD with acute exacerbation ([place]) Never diagnosed with COPD, no daily symptoms, but long time smoker.   Now with wheezing, cough prior to admission.  CXR clear. - Hold Solu-medrol - Start Dulera - PRN Duo-neb  - No sputum, hold antibiotics  Asymptomatic bacteriuria Had potentially allergic reaction to Rocephin.   - Hold Levaquin - Follow urine culture  Nicotine dependence, cigarettes, uncomplicated - Nicotine gum PRN  Mixed hyperlipidemia due to type 2 diabetes mellitus (HCC) - Continue simvastatin  Type 2 diabetes mellitus, controlled (HCC) Last hemoglobin A1c in May 2023 of 6.3, glucose okay ovenright - Continue SS corrections  Essential hypertension - Contnue amlodipine, lisinopril - Stop HCTZ - Start spironolactone          Subjective: Patient feeling much better, her tremor, leg cramps, nausea resolved, she has no confusion.  She is feels like her wheezing is also better.  No dysuria, low back pain, bladder cramps, urinary frequency.     Physical Exam: Vitals:   06/12/22 0037 06/12/22 0100 06/12/22 0200 06/12/22 0346  BP: (!) 177/75 (!) 160/68 (!) 162/69   Pulse: 84 84 83   Resp: (!) 23 (!) 21 (!) 28    Temp:    98.4 F (36.9 C)  TempSrc:    Oral  SpO2: 96% 95% 95%   Weight:      Height:       Thin adult female, lying in bed, no acute distress, interactive RRR, no murmurs, no peripheral edema Respiratory rate normal, lungs with some inspiratory and expiratory wheezing, mild and intermittent Abdomen soft without tenderness palpation or guarding, no ascites or distention Attention normal, affect normal, no tremor, judgment and insight appear normal, face symmetric, speech fluent   Data Reviewed: Basic metabolic panels overnight reviewed, sodium up to 125 this morning Creatinine and potassium normal Lactate and troponin normal Chest x-ray yesterday without mass Urinalysis with bacteria Urine culture pending Complete blood count normal CT head normal Urine sodium and urine osm reviewed and summarized above  Family Communication: Daughter    Disposition: Status is: Inpatient The patient was admitted for hyponatremia, she remains hyponatremic, require ongoing close monitoring of urine sodium every 4 hours for the next 24 hours, should be moved to inpatient, but if her sodium is above 130 tomorrow, and her symptoms are still resolved we will plan for discharge         Author: Edwin Dada, MD 06/12/2022 8:02 AM  For on call review [email or web address].

## 2022-06-12 NOTE — Assessment & Plan Note (Addendum)
-   Contnue amlodipine, lisinopril - Stop HCTZ - Start spironolactone

## 2022-06-12 NOTE — Progress Notes (Signed)
ABI's have been completed. Preliminary results can be found in CV Proc through chart review.   06/12/22 8:48 AM Shelly Clark RVT

## 2022-06-12 NOTE — Hospital Course (Signed)
Mrs. Litzau is a 76 y.o. F with DM, HTN, migraines, smoking who presented with few days vomiting, diffuse abdominal pain, shortness of breath and cough.  In the ER, Na 115, blood pressure elevated. Admitted and started on hypertonic saline.

## 2022-06-12 NOTE — Progress Notes (Signed)
Transition of Care [facility]) Screening Note  Patient Details  Name: Shelly Clark Date of Birth: [birth year]  Transition of Care [facility]) CM/SW Contact:    Sherie Don, LCSW Phone Number: 06/12/2022, 10:09 AM  Transition of Care Department [facility]) has reviewed patient and no TOC needs have been identified at this time. We will continue to monitor patient advancement through interdisciplinary progression rounds. If new patient transition needs arise, please place a TOC consult.

## 2022-06-12 NOTE — Assessment & Plan Note (Addendum)
-   Nicotine gum PRN - ABIs

## 2022-06-13 ENCOUNTER — Telehealth: Payer: Self-pay

## 2022-06-13 ENCOUNTER — Other Ambulatory Visit (HOSPITAL_COMMUNITY): Payer: Self-pay

## 2022-06-13 DIAGNOSIS — E871 Hypo-osmolality and hyponatremia: Secondary | ICD-10-CM | POA: Diagnosis not present

## 2022-06-13 LAB — CBC
HCT: 42.1 % (ref 36.0–46.0)
Hemoglobin: 14.6 g/dL (ref 12.0–15.0)
MCH: 28.7 pg (ref 26.0–34.0)
MCHC: 34.7 g/dL (ref 30.0–36.0)
MCV: 82.7 fL (ref 80.0–100.0)
Platelets: 285 10*3/uL (ref 150–400)
RBC: 5.09 MIL/uL (ref 3.87–5.11)
RDW: 12.8 % (ref 11.5–15.5)
WBC: 12.1 10*3/uL — ABNORMAL HIGH (ref 4.0–10.5)
nRBC: 0 % (ref 0.0–0.2)

## 2022-06-13 LAB — URINE CULTURE: Culture: >=100000 — AB

## 2022-06-13 LAB — BASIC METABOLIC PANEL
Anion gap: 9 (ref 5–15)
BUN: 20 mg/dL (ref 8–23)
CO2: 22 mmol/L (ref 22–32)
Calcium: 8.8 mg/dL — ABNORMAL LOW (ref 8.9–10.3)
Chloride: 102 mmol/L (ref 98–111)
Creatinine, Ser: 0.52 mg/dL (ref 0.44–1.00)
GFR, Estimated: >60 mL/min (ref >60)
Glucose, Bld: 125 mg/dL — ABNORMAL HIGH (ref 70–99)
Potassium: 3.7 mmol/L (ref 3.5–5.1)
Sodium: 133 mmol/L — ABNORMAL LOW (ref 135–145)

## 2022-06-13 LAB — GLUCOSE, CAPILLARY: Glucose-Capillary: 148 mg/dL — ABNORMAL HIGH (ref 70–99)

## 2022-06-13 MED ORDER — SPIRONOLACTONE 25 MG PO TABS
25.0000 mg | ORAL_TABLET | Freq: Every day | ORAL | 0 refills | Status: DC
  Filled 2022-06-13: qty 30, 30d supply, fill #0

## 2022-06-13 MED ORDER — FLUTICASONE-SALMETEROL 100-50 MCG/ACT IN AEPB: 1.0000 | INHALATION_SPRAY | Freq: Two times a day (BID) | RESPIRATORY_TRACT | 0 refills | Status: DC

## 2022-06-13 MED ORDER — FLUTICASONE-SALMETEROL 100-50 MCG/ACT IN AEPB
1.0000 | INHALATION_SPRAY | Freq: Two times a day (BID) | RESPIRATORY_TRACT | 0 refills | Status: DC
  Filled 2022-06-13: qty 60, 30d supply, fill #0

## 2022-06-13 NOTE — Discharge Summary (Signed)
Physician Discharge Summary   Patient: Shelly Clark MRN: [id number] DOB: Mar 20, 1946  Admit date:     06/11/2022  Discharge date: 06/13/22  Discharge Physician: Edwin Dada   PCP: [name], NP     Recommendations at discharge:  Follow up with PCP in 1 week Cory Nafziger:  Please check BMP in 1 week to re-evaluate Na (discharge 133) Please check BP on new spironolactone  Please obtain spirometry or refer for PFTs and check symptoms on new Advair (need to continue or can stop?)     Discharge Diagnoses: Principal Problem:   Hyponatremia Active Problems:   Essential hypertension   Type 2 diabetes mellitus, controlled ([place])   Mixed hyperlipidemia due to type 2 diabetes mellitus (HCC)   Nicotine dependence, cigarettes, uncomplicated   Asymptomatic bacteriuria   COPD with acute exacerbation [facility])       Hospital Course: Mrs. Ficek is a 76 y.o. F with DM, HTN, migraines, smoking who presented with few days vomiting, diffuse abdominal pain, shortness of breath and cough.  In the ER, Na 115, blood pressure elevated. Admitted and started on hypertonic saline.   * Hyponatremia In setting of HCTZ, SSRI and proably lung disease.  Worsened by hypovolemia/vomiting.  CXR wtihout mass.  Started on hypertonic saline and admitted to stepdown.  Na corrected at 0.5 meq/L/hr for 24 hours, saline stopped.    Na continued to correct and was 133 mmol/L at discharge, patient's symptoms of tremor, confusion, leg cramps and malaise were completely resolved.   HCTZ stopped, SSRI continued.    COPD with acute exacerbation ([place]) No prior PFTs that I can see, patient denies diagnosis, but has Symbicort on med list.  No daily symptoms, but long time smoker.    Here with mild wheezing on admission, got prednisone x1, discharged with new ICS/LABA inhaler and PCP follow up.  Recommended smoking cessation.     Asymptomatic bacteriuria Had potentially allergic reaction to Rocephin  (daughter and patient observed "redness on her neck and face" after starting a Rocephin infusion), added to allergy list.    In the absence of symptoms, I recommend not treating asymptomatic bacteriuria.  Patient given return precautions if symptoms arise.     Essential hypertension Continued amlodipine, lisinopril.   - Stop HCTZ - Start spironolactone            The [place] was reviewed for this patient prior to discharge.    Disposition: Home Diet recommendation:  Discharge Diet Orders (From admission, onward)     Start     Ordered   06/13/22 0000  Diet - low sodium heart healthy        06/13/22 1009             DISCHARGE MEDICATION: Allergies as of 06/13/2022       Reactions   Codeine Nausea And Vomiting   Phenylpropanolamine Nausea And Vomiting, Other (See Comments)   "Tavist-D"   Prednisone Nausea Only, Other (See Comments)   Can tolerate via IV, but not orally   Tavist [clemastine] Nausea And Vomiting   "Tavist-D"   Nicoderm [nicotine] Rash, Other (See Comments)   Patient said she is ALLERGIC to the adhesive   Rocephin [ceftriaxone] Rash   06/11/22 got "redness on the neck and face" after an infusion of Rocephin        Medication List     STOP taking these medications    budesonide-formoterol 80-4.5 MCG/ACT inhaler Commonly known as: Symbicort  hydrochlorothiazide 25 MG tablet Commonly known as: HYDRODIURIL   pantoprazole 20 MG tablet Commonly known as: PROTONIX   predniSONE 20 MG tablet Commonly known as: DELTASONE   triamcinolone ointment 0.5 % Commonly known as: KENALOG       TAKE these medications    albuterol 108 (90 Base) MCG/ACT inhaler Commonly known as: VENTOLIN HFA Inhale 1-2 puffs into the lungs every 6 (six) hours as needed for wheezing or shortness of breath.   amLODipine 10 MG tablet Commonly known as: NORVASC TAKE 1 TABLET(10 MG) BY MOUTH DAILY What changed: See the new  instructions.   B-complex with vitamin C tablet Take 1 tablet by mouth daily.   clonazePAM 0.5 MG tablet Commonly known as: KLONOPIN Take 1 tablet (0.5 mg total) by mouth at bedtime as needed. for sleep What changed:  when to take this reasons to take this additional instructions   FLUoxetine 20 MG capsule Commonly known as: PROZAC Take 20 mg by mouth daily. What changed: Another medication with the same name was removed. Continue taking this medication, and follow the directions you see here.   fluticasone-salmeterol 100-50 MCG/ACT Aepb Commonly known as: ADVAIR Inhale 1 puff into the lungs 2 times daily.   glipiZIDE 5 MG 24 hr tablet Commonly known as: GLUCOTROL XL TAKE 1 TABLET(5 MG) BY MOUTH DAILY WITH BREAKFAST What changed: See the new instructions.   lisinopril 40 MG tablet Commonly known as: ZESTRIL Take 1 tablet (40 mg total) by mouth daily.   metFORMIN 500 MG tablet Commonly known as: GLUCOPHAGE Take 500 mg by mouth 2 (two) times daily with a meal.   multivitamin with minerals Tabs tablet Take 1 tablet by mouth daily with breakfast.   naproxen 500 MG tablet Commonly known as: NAPROSYN Take 500 mg by mouth 2 (two) times daily as needed for mild pain.   nicotine 21 mg/24hr patch Commonly known as: Nicoderm CQ Place 1 patch (21 mg total) onto the skin daily.   ondansetron 4 MG tablet Commonly known as: Zofran Take 1 tablet (4 mg total) by mouth every 8 (eight) hours as needed for nausea or vomiting.   simvastatin 20 MG tablet Commonly known as: ZOCOR Take 1 tablet (20 mg total) by mouth daily.   spironolactone 25 MG tablet Commonly known as: ALDACTONE Take 1 tablet  by mouth daily. Start taking on: June 14, 2022        Follow-up Information     Nafziger, Tommi Rumps, NP Follow up.   Specialty: Family Medicine Contact information: [address] [place] [address] [phone]                 Discharge Instructions     Diet -  low sodium heart healthy   Complete by: As directed    Discharge instructions   Complete by: As directed    From Dr. Loleta Books: You were admitted for low sodium This was probably from a combination of your wheezing, your vomiting, and your medicines You may continue fluoxetine but STOP hydrochlorothiazide/HCTZ In it's place, start the new blood pressure medicine spironolactone 25 mg daily  Go see your primary care doctor [name] in 1 week for Blood pressure check and lab check   For your lungs: Start the new inhaler fluticasone-salmeterol 100-50 twice daily Go see Tommi Rumps and ask about "spirometry" or "pulmonary function testing"  STOP smoking  Resume your other home medicines. If you have any recently prescribed prednisone, don't take it and go see Tommi Rumps  Increase activity slowly   Complete by: As directed        Discharge Exam: Filed Weights   06/11/22 1027  Weight: 58.1 kg    General: Pt is alert, awake, not in acute distress Cardiovascular: RRR, nl S1-S2, no murmurs appreciated.   No LE edema.   Respiratory: Normal respiratory rate and rhythm.  CTAB without rales or wheezes. Abdominal: Abdomen soft and non-tender.  No distension or HSM.   Neuro/Psych: Strength symmetric in upper and lower extremities.  Judgment and insight appear normal.   Condition at discharge: good  The results of significant diagnostics from this hospitalization (including imaging, microbiology, ancillary and laboratory) are listed below for reference.   Imaging Studies: Inpatient: VAS [place] PAD ABI  Result Date: 06/12/2022  LOWER EXTREMITY DOPPLER STUDY Patient Name:  DEZERAY PUCCIO  Date of Exam:   06/12/2022 Medical Rec #: [id number]        Accession #:    [id number] Date of Birth: 1946/05/03        Patient Gender: F Patient Age:   76 years Exam Location:  [facility] Procedure:      VAS [place] ABI WITH/WO TBI Referring Phys: [facility]  --------------------------------------------------------------------------------  Indications: Peripheral artery disease. High Risk Factors: Hypertension, Diabetes.  Limitations: Today's exam was limited due to bandages and Right IV. Comparison Study: No prior studies. Performing Technologist: Carlos Levering RVT  Examination Guidelines: A complete evaluation includes at minimum, Doppler waveform signals and systolic blood pressure reading at the level of bilateral brachial, anterior tibial, and posterior tibial arteries, when vessel segments are accessible. Bilateral testing is considered an integral part of a complete examination. Photoelectric Plethysmograph (PPG) waveforms and toe systolic pressure readings are included as required and additional duplex testing as needed. Limited examinations for reoccurring indications may be performed as noted.  ABI Findings: +---------+------------------+-----+---------+--------+ Right    Rt Pressure (mmHg)IndexWaveform Comment  +---------+------------------+-----+---------+--------+ Brachial                                 IV       +---------+------------------+-----+---------+--------+ PTA      184               1.14 triphasic         +---------+------------------+-----+---------+--------+ DP       168               1.04 triphasic         +---------+------------------+-----+---------+--------+ Great Toe146               0.91                   +---------+------------------+-----+---------+--------+ +---------+------------------+-----+---------+-------+ Left     Lt Pressure (mmHg)IndexWaveform Comment +---------+------------------+-----+---------+-------+ Brachial 161                    triphasic        +---------+------------------+-----+---------+-------+ PTA      178               1.11 triphasic        +---------+------------------+-----+---------+-------+ DP       164               1.02 triphasic         +---------+------------------+-----+---------+-------+ Great Toe128               0.80                  +---------+------------------+-----+---------+-------+ +-------+-----------+-----------+------------+------------+  ABI/TBIToday's ABIToday's TBIPrevious ABIPrevious TBI +-------+-----------+-----------+------------+------------+ Right  1.14       0.91                                +-------+-----------+-----------+------------+------------+ Left   1.11       0.8                                 +-------+-----------+-----------+------------+------------+  Summary: Right: Resting right ankle-brachial index is within normal range. No evidence of significant right lower extremity arterial disease. The right toe-brachial index is normal. Left: Resting left ankle-brachial index is within normal range. No evidence of significant left lower extremity arterial disease. The left toe-brachial index is normal. Guidlines:  Patients with an ABI >1.2 or <0.9 have a high risk of peripheral atherosclerosis. Patients with asymptomatic peripheral atherosclerosis can be managed without further referral, at the discretion of the ordering provider. Guidelines for medical therapy include the following: - ABI is in normal range, with an absence of peripheral arterial symptoms. No follow up needed. The following therapies are recommendations from the Society of Vascular Surgery for patients with peripheral arterial disease: _ Multidisciplinary comprehensive smoking cessation interventions. _ Blood glucose control with goal A1c < 7%. _ Blood pressure control, < 140/90 mmHg. _ Statin therapy, goal LDL-C at least <100 mg/dL. _ Supervised exercise program (30-60 min of walking, three times a week) for patients with intermittent claudication. _ Aspirin '81mg'$  by mouth daily  *See table(s) above for measurements and observations.  Electronically signed by Servando Snare MD on 06/12/2022 at 4:22:01 PM.    Final    CT Head Wo  Contrast  Result Date: 06/11/2022 CLINICAL DATA:  Altered mental status.  Vomiting and confusion. EXAM: CT HEAD WITHOUT CONTRAST TECHNIQUE: Contiguous axial images were obtained from the base of the skull through the vertex without intravenous contrast. RADIATION DOSE REDUCTION: This exam was performed according to the departmental dose-optimization program which includes automated exposure control, adjustment of the mA and/or kV according to patient size and/or use of iterative reconstruction technique. COMPARISON:  None Available. FINDINGS: Brain: Mild diffuse white matter hypoattenuation is present. No acute infarct, hemorrhage, or mass lesion is present. Basal ganglia are intact. Insular ribbon is normal bilaterally. No acute or focal cortical abnormalities are present. The ventricles are of normal size. No significant extraaxial fluid collection is present. Vascular: No hyperdense vessel or unexpected calcification. Skull: Calvarium is intact. No focal lytic or blastic lesions are present. No significant extracranial soft tissue lesion is present. Sinuses/Orbits: The paranasal sinuses and mastoid air cells are clear. The globes and orbits are within normal limits. IMPRESSION: 1. Mild diffuse white matter hypoattenuation. This likely reflects the sequela of chronic microvascular ischemia. 2. No acute intracranial abnormality. Electronically Signed   By: San Morelle M.D.   On: 06/11/2022 11:26   DG Chest 2 View  Result Date: 06/10/2022 CLINICAL DATA:  Short of breath, nausea for 3 days EXAM: CHEST - 2 VIEW COMPARISON:  01/27/2021 FINDINGS: Frontal and lateral views of the chest demonstrate an unremarkable cardiac silhouette. No acute airspace disease, effusion, or pneumothorax. No acute bony abnormalities. IMPRESSION: 1. No acute intrathoracic process. Electronically Signed   By: Randa Ngo M.D.   On: 06/10/2022 17:17    Microbiology: Results for orders placed or performed during the  hospital encounter of 06/11/22  Urine Culture  Status: Abnormal   Collection Time: 06/11/22 12:16 PM   Specimen: Urine, Clean Catch  Result Value Ref Range Status   Specimen Description   Final    URINE, CLEAN CATCH Performed at [facility], [address], [place], [place] [address]    Special Requests   Final    NONE Performed at [facility], [place], [place] [address]    Culture >=100,000 COLONIES/mL ESCHERICHIA COLI (A)  Final   Report Status 06/13/2022 FINAL  Final   Organism ID, Bacteria ESCHERICHIA COLI (A)  Final      Susceptibility   Escherichia coli - MIC*    AMPICILLIN >=32 RESISTANT Resistant     CEFAZOLIN <=4 SENSITIVE Sensitive     CEFEPIME <=0.12 SENSITIVE Sensitive     CEFTRIAXONE <=0.25 SENSITIVE Sensitive     CIPROFLOXACIN >=4 RESISTANT Resistant     GENTAMICIN <=1 SENSITIVE Sensitive     IMIPENEM <=0.25 SENSITIVE Sensitive     NITROFURANTOIN <=16 SENSITIVE Sensitive     TRIMETH/SULFA <=20 SENSITIVE Sensitive     AMPICILLIN/SULBACTAM 16 INTERMEDIATE Intermediate     PIP/TAZO <=4 SENSITIVE Sensitive     * >=100,000 COLONIES/mL ESCHERICHIA COLI  Blood culture (routine x 2)     Status: None (Preliminary result)   Collection Time: 06/11/22 12:30 PM   Specimen: BLOOD  Result Value Ref Range Status   Specimen Description   Final    BLOOD BLOOD LEFT WRIST Performed at [facility], [address] [place], [place] [address]    Special Requests   Final    BOTTLES DRAWN AEROBIC AND ANAEROBIC Blood Culture adequate volume Performed at [facility], [address] [place], [place] [address]    Culture   Final    NO GROWTH 2 DAYS Performed at [facility], [place] [address], [place], [place] [address]    Report Status PENDING  Incomplete  SARS Coronavirus 2 by RT PCR (hospital order, performed in [facility] hospital lab) *cepheid single result test* Anterior Nasal Swab      Status: None   Collection Time: 06/11/22 12:40 PM   Specimen: Anterior Nasal Swab  Result Value Ref Range Status   SARS Coronavirus 2 by RT PCR NEGATIVE NEGATIVE Final    Comment: (NOTE) SARS-CoV-2 target nucleic acids are NOT DETECTED.  The SARS-CoV-2 RNA is generally detectable in upper and lower respiratory specimens during the acute phase of infection. The lowest concentration of SARS-CoV-2 viral copies this assay can detect is 250 copies / mL. A negative result does not preclude SARS-CoV-2 infection and should not be used as the sole basis for treatment or other patient management decisions.  A negative result may occur with improper specimen collection / handling, submission of specimen other than nasopharyngeal swab, presence of viral mutation(s) within the areas targeted by this assay, and inadequate number of viral copies (<250 copies / mL). A negative result must be combined with clinical observations, patient history, and epidemiological information.  Fact Sheet for Patients:   https://www.patel.info/  Fact Sheet for Healthcare Providers: https://hall.com/  This test is not yet approved or  cleared by the [place] FDA and has been authorized for detection and/or diagnosis of SARS-CoV-2 by FDA under an Emergency Use Authorization (EUA).  This EUA will remain in effect (meaning this test can be used) for the duration of the COVID-19 declaration under Section 564(b)(1) of the Act, 21 U.S.C. section 360bbb-3(b)(1), unless the authorization is terminated or  revoked sooner.  Performed at [facility], [address], [place], [place] [address]   Blood culture (routine x 2)     Status: None (Preliminary result)   Collection Time: 06/11/22 12:50 PM   Specimen: BLOOD RIGHT HAND  Result Value Ref Range Status   Specimen Description   Final    BLOOD RIGHT HAND Performed at [facility], [address], [place], [place] [address]    Special Requests   Final    BOTTLES DRAWN AEROBIC AND ANAEROBIC Blood Culture adequate volume Performed at [facility], [address], [place], [place] [address]    Culture   Final    NO GROWTH 2 DAYS Performed at [facility], [place] [address] [place], [place] [address]    Report Status PENDING  Incomplete  MRSA Next Gen by PCR, Nasal     Status: None   Collection Time: 06/11/22 11:37 PM   Specimen: Nasal Mucosa; Nasal Swab  Result Value Ref Range Status   MRSA by PCR Next Gen NOT DETECTED NOT DETECTED Final    Comment: (NOTE) The GeneXpert MRSA Assay (FDA approved for NASAL specimens only), is one component of a comprehensive MRSA colonization surveillance program. It is not intended to diagnose MRSA infection nor to guide or monitor treatment for MRSA infections. Test performance is not FDA approved in patients less than 33 years old. Performed at [facility], [address] [place], [place] [address]     Labs: CBC: Recent Labs  Lab 06/10/22 1725 06/11/22 1052 06/13/22 0243  WBC 7.5 9.7 12.1*  NEUTROABS  --  7.9*  --   HGB 14.4 15.3* 14.6  HCT 41.6 42.3 42.1  MCV 82.5 79.5* 82.7  PLT 286 310 300   Basic Metabolic Panel: Recent Labs  Lab 06/10/22 1725 06/11/22 1052 06/11/22 1812 06/11/22 2149 06/12/22 0159 06/12/22 0622 06/12/22 1013 06/12/22 1331 06/13/22 0243  NA 123* 115* 120*   < > 120* 125* 128* 128* 133*  K 3.8 3.9 3.7  --   --  3.8  --   --  3.7  CL 89* 79* 88*  --   --  93*  --   --  102  CO2 23 22 19*  --   --  21*  --   --  22  GLUCOSE 124* 201* 161*  --   --  163*  --   --  125*  BUN '12 19 15  '$ --   --  14  --   --  20  CREATININE 0.50 0.69 0.38*  --   --  0.47  --   --  0.52  CALCIUM 9.5 10.0 8.3*  --   --  9.0  --   --  8.8*   < > = values in this interval not displayed.   Liver Function Tests: Recent Labs  Lab 06/11/22 1052  AST 33  ALT 31   ALKPHOS 63  BILITOT 0.9  PROT 7.7  ALBUMIN 4.7   CBG: Recent Labs  Lab 06/12/22 0731 06/12/22 1212 06/12/22 1523 06/12/22 2132 06/13/22 0730  GLUCAP 173* 140* 164* 135* 148*    Discharge time spent: approximately 35 minutes spent on discharge counseling, evaluation of patient on day of discharge, and coordination of discharge planning with nursing, social work, pharmacy and case management  Signed: Edwin Dada, MD [facility] 06/13/2022

## 2022-06-13 NOTE — Telephone Encounter (Signed)
Transition Care Management Follow-up Telephone Call Date of discharge and from where: 06/13/22 from [facility] How have you been since you were released from the hospital? Pt states she feels "good"; daughter states pt is doing really well, shaking has decreased. "She's looking so much better than Saturday" Any questions or concerns? No  Items Reviewed: Did the pt receive and understand the discharge instructions provided? Yes  Medications obtained and verified? Yes  Other? Pt would like this RN to call her daughter as well to ensure daughter does not have questions. Daughter able to verb medication instructions. Will pick up Aldactone today to start taking tomorrow.  Any new allergies since your discharge? No  Dietary orders reviewed? No Do you have support at home? Yes   Home Care and Equipment/Supplies: Were home health services ordered? not applicable   Functional Questionnaire: (I = Independent and D = Dependent) ADLs: I  Bathing/Dressing- I  Meal Prep- I  Eating- I  Maintaining continence- I  Transferring/Ambulation- I  Managing Meds- D - daughter manages all meds & ensures that pt takes what she needs when she needs  Follow up appointments reviewed:  PCP Hospital f/u appt confirmed? Yes  Scheduled to see Tommi Rumps on 06/20/22 @ 10am. Are transportation arrangements needed? No  If their condition worsens, is the pt aware to call PCP or go to the Emergency Dept.? Yes Was the patient provided with contact information for the PCP's office or ED? Yes Was to pt encouraged to call back with questions or concerns? Yes

## 2022-06-14 ENCOUNTER — Other Ambulatory Visit (HOSPITAL_COMMUNITY): Payer: Self-pay

## 2022-06-15 ENCOUNTER — Other Ambulatory Visit: Payer: Self-pay | Admitting: Adult Health

## 2022-06-15 DIAGNOSIS — E119 Type 2 diabetes mellitus without complications: Secondary | ICD-10-CM

## 2022-06-15 NOTE — Telephone Encounter (Signed)
Rx done. 

## 2022-06-16 LAB — CULTURE, BLOOD (ROUTINE X 2)
Culture: NO GROWTH
Culture: NO GROWTH
Special Requests: ADEQUATE
Special Requests: ADEQUATE

## 2022-06-20 ENCOUNTER — Ambulatory Visit (INDEPENDENT_AMBULATORY_CARE_PROVIDER_SITE_OTHER): Payer: PPO | Admitting: Adult Health

## 2022-06-20 ENCOUNTER — Encounter: Payer: Self-pay | Admitting: Adult Health

## 2022-06-20 VITALS — BP 140/60 | HR 75 | Temp 97.7°F | Ht 60.0 in | Wt 127.0 lb

## 2022-06-20 DIAGNOSIS — J441 Chronic obstructive pulmonary disease with (acute) exacerbation: Secondary | ICD-10-CM

## 2022-06-20 DIAGNOSIS — I10 Essential (primary) hypertension: Secondary | ICD-10-CM | POA: Diagnosis not present

## 2022-06-20 DIAGNOSIS — E871 Hypo-osmolality and hyponatremia: Secondary | ICD-10-CM | POA: Diagnosis not present

## 2022-06-20 DIAGNOSIS — R8271 Bacteriuria: Secondary | ICD-10-CM

## 2022-06-20 LAB — BASIC METABOLIC PANEL
BUN: 18 mg/dL (ref 6–23)
CO2: 24 mEq/L (ref 19–32)
Calcium: 9.6 mg/dL (ref 8.4–10.5)
Chloride: 102 mEq/L (ref 96–112)
Creatinine, Ser: 0.59 mg/dL (ref 0.40–1.20)
GFR: 89.22 mL/min (ref >60.00)
Glucose, Bld: 110 mg/dL — ABNORMAL HIGH (ref 70–99)
Potassium: 4.2 mEq/L (ref 3.5–5.1)
Sodium: 135 mEq/L (ref 135–145)

## 2022-06-20 NOTE — Progress Notes (Signed)
Subjective:    Patient ID: Shelly Clark, female    DOB: 02-Apr-1946, 76 y.o.   MRN: [id number]  HPI 76 year old female who  has a past medical history of Benign hematuria, Diabetes mellitus without complication ([place]), Esophageal dysmotility, Hyperlipidemia, Hypertension, Migraines, Nicotine dependence, cigarettes, uncomplicated (81/27/5170), Osteoporosis, and Tobacco abuse.  She presents to the office today for TCM visit   Admit Date: 06/11/2022 Discharge Date 06/13/2022  She presented to the emergency room with a few days of vomiting, diffuse abdominal pain, shortness of breath, and cough.  She was seen in the ER the day before her admission with the same symptoms.  She was treated with DuoNebs for presumed undiagnosed COPD with improvement in her symptoms and discharged home.  At this time her sodium was low at 123 but she requested to be discharged before her labs resulted.  On the day of admission in the ER her lab work showed a sodium of 115 and a glucose of 201.  She had noticed increased calf cramping and upper extremity tremors.  She was mildly tachypneic and hypertensive with systolic blood pressure of 180.  UA was positive for nitrates and negative for leukocytosis and many bacteria.  She was given IV Rocephin in the ED but family had noticed some increased facial and neck redness shortly after.  Her head CT showed no acute intracranial findings  She was admitted and started on hypertonic saline.  Hospital Course  Hyponatremia  -In the setting of HCTZ, SSRI, and probable lung disease.  This was worsened by hypovolemia and vomiting.  Chest x-ray was unremarkable.  He was started on hypertonic saline and mated to stepdown.  Sodium corrected at 0.5 mEq/L/h for 24 hours and then saline was stopped.  Upon discharge her sodium level is 133, her symptoms of tremor, confusion, leg cramps, and malaise were completely resolved.  Her HCTZ was stopped and she was started on spironolactone 25  mg daily.  Her SSRI was continued  COPD with acute exacerbation  -Longtime smoker and continues to smoke about a pack and a half a day.  She was on Symbicort prior to admission.  She did have mild wheezing on admission.  Received prednisone x1 and discharged on new ICS/LABA inhaler.  Recommended PFTs or pulmonary follow-up  Asymptomatic bacteriuria -Had a potentially allergic reaction to Rocephin.  Was thought that she was possibly colonized and notes further treatment was given.  Essential  hypertension -Continued on amlodipine and lisinopril.  HCTZ was discontinued and she was started on spironolactone 25 mg daily  Today she reports that she feels much better and feels as though she is back to baseline.  She did stop her hydrochlorothiazide but is unsure if she started spironolactone.  She does feel as though her breathing is better on her arm.  On a 1 month from the court.  She understands that she needs to quit smoking and she was encouraged to do so.  She denies any UTI-like symptoms.  Review of Systems  Constitutional: Negative.   HENT: Negative.    Eyes: Negative.   Respiratory: Negative.    Cardiovascular: Negative.   Gastrointestinal: Negative.   Endocrine: Negative.   Genitourinary: Negative.   Musculoskeletal: Negative.   Skin: Negative.   Allergic/Immunologic: Negative.   Neurological: Negative.   Hematological: Negative.   Psychiatric/Behavioral: Negative.     Past Medical History:  Diagnosis Date   Benign hematuria    neg urology workup   Diabetes mellitus without  complication (HCC)    Esophageal dysmotility    Hyperlipidemia    Hypertension    Migraines    Nicotine dependence, cigarettes, uncomplicated 93/71/6967   Osteoporosis    Tobacco abuse     Social History   Socioeconomic History   Marital status: Married    Spouse name: Not on file   Number of children: Not on file   Years of education: Not on file   Highest education level: Not on file   Occupational History   Occupation: OWNER    Employer: [employer]    Comment: Restaurant  Tobacco Use   Smoking status: Every Day    Packs/day: 2.00    Years: 40.00    Total pack years: 80.00    Types: Cigarettes   Smokeless tobacco: Never  Vaping Use   Vaping Use: Never used  Substance and Sexual Activity   Alcohol use: No   Drug use: No   Sexual activity: Not on file  Other Topics Concern   Not on file  Social History Narrative   ** Merged History Encounter **       Owns a home  She is married  Two children      Social Determinants of Health   Financial Resource Strain: Low Risk  (05/04/2021)   Overall Financial Resource Strain (CARDIA)    Difficulty of Paying Living Expenses: Not hard at all  Food Insecurity: No Food Insecurity (05/04/2021)   Hunger Vital Sign    Worried About Running Out of Food in the Last Year: Never true    [place] in the Last Year: Never true  Transportation Needs: No Transportation Needs (05/04/2021)   PRAPARE - Hydrologist (Medical): No    Lack of Transportation (Non-Medical): No  Physical Activity: Sufficiently Active (05/04/2021)   Exercise Vital Sign    Days of Exercise per Week: 5 days    Minutes of Exercise per Session: 60 min  Stress: No Stress Concern Present (05/04/2021)   [place]    Feeling of Stress : Not at all  Social Connections: [place] (05/04/2021)   Social Connection and Isolation Panel [NHANES]    Frequency of Communication with Friends and Family: Three times a week    Frequency of Social Gatherings with Friends and Family: Not on file    Attends Religious Services: More than 4 times per year    Active Member of [employer] or Organizations: Yes    Attends Archivist Meetings: Never    Marital Status: Married  Human resources officer Violence: Not At Risk (05/04/2021)   Humiliation, Afraid, Rape, and Kick questionnaire     Fear of Current or Ex-Partner: No    Emotionally Abused: No    Physically Abused: No    Sexually Abused: No    Past Surgical History:  Procedure Laterality Date   ABDOMINAL HYSTERECTOMY     MOUTH SURGERY      Family History  Problem Relation Age of Onset   Ovarian cancer Mother    Diabetes Mother    Hypertension Mother    Hyperlipidemia Mother    Diabetes Father    Hypertension Father    Hyperlipidemia Father    Hypertension Brother    Hypertension Daughter    Hypertension Daughter     Allergies  Allergen Reactions   Codeine Nausea And Vomiting   Phenylpropanolamine Nausea And Vomiting and Other (See Comments)    "  Tavist-D"   Prednisone Nausea Only and Other (See Comments)    Can tolerate via IV, but not orally   Tavist [Clemastine] Nausea And Vomiting    "Tavist-D"   Nicoderm [Nicotine] Rash and Other (See Comments)    Patient said she is ALLERGIC to the adhesive   Rocephin [Ceftriaxone] Rash    06/11/22 got "redness on the neck and face" after an infusion of Rocephin    Current Outpatient Medications on File Prior to Visit  Medication Sig Dispense Refill   albuterol (VENTOLIN HFA) 108 (90 Base) MCG/ACT inhaler Inhale 1-2 puffs into the lungs every 6 (six) hours as needed for wheezing or shortness of breath. 108 g 0   amLODipine (NORVASC) 10 MG tablet TAKE 1 TABLET(10 MG) BY MOUTH DAILY (Patient taking differently: Take 10 mg by mouth daily.) 90 tablet 3   B Complex-C (B-COMPLEX WITH VITAMIN C) tablet Take 1 tablet by mouth daily.     clonazePAM (KLONOPIN) 0.5 MG tablet Take 1 tablet (0.5 mg total) by mouth at bedtime as needed. for sleep (Patient taking differently: Take 0.5 mg by mouth daily as needed for anxiety (or sleep).) 30 tablet 2   FLUoxetine (PROZAC) 20 MG capsule Take 20 mg by mouth daily.     fluticasone-salmeterol (ADVAIR) 100-50 MCG/ACT AEPB Inhale 1 puff into the lungs 2 times daily. 60 each 0   glipiZIDE (GLUCOTROL XL) 5 MG 24 hr tablet TAKE 1  TABLET(5 MG) BY MOUTH DAILY WITH BREAKFAST 90 tablet 0   lisinopril (ZESTRIL) 40 MG tablet Take 1 tablet (40 mg total) by mouth daily. 90 tablet 3   metFORMIN (GLUCOPHAGE) 500 MG tablet TAKE 1 TABLET BY MOUTH TWICE DAILY WITH A MEAL 180 tablet 0   Multiple Vitamin (MULTIVITAMIN WITH MINERALS) TABS Take 1 tablet by mouth daily with breakfast.     naproxen (NAPROSYN) 500 MG tablet Take 500 mg by mouth 2 (two) times daily as needed for mild pain.     nicotine (NICODERM CQ) 21 mg/24hr patch Place 1 patch (21 mg total) onto the skin daily. 28 patch 11   ondansetron (ZOFRAN) 4 MG tablet Take 1 tablet (4 mg total) by mouth every 8 (eight) hours as needed for nausea or vomiting. 20 tablet 1   simvastatin (ZOCOR) 20 MG tablet Take 1 tablet (20 mg total) by mouth daily. 90 tablet 3   spironolactone (ALDACTONE) 25 MG tablet Take 1 tablet  by mouth daily. 30 tablet 0   No current facility-administered medications on file prior to visit.    BP 140/60   Pulse 75   Temp 97.7 F (36.5 C) (Oral)   Ht 5' (1.524 m)   Wt 127 lb (57.6 kg)   SpO2 98%   BMI 24.80 kg/m       Objective:   Physical Exam Vitals and nursing note reviewed.  Constitutional:      Appearance: Normal appearance.  Cardiovascular:     Rate and Rhythm: Normal rate and regular rhythm.     Pulses: Normal pulses.     Heart sounds: Normal heart sounds.  Pulmonary:     Effort: Pulmonary effort is normal.     Breath sounds: Normal breath sounds.  Musculoskeletal:     Right lower leg: No edema.     Left lower leg: No edema.  Skin:    General: Skin is warm and dry.  Neurological:     General: No focal deficit present.     Mental Status: She is alert  and oriented to person, place, and time.  Psychiatric:        Mood and Affect: Mood normal.        Behavior: Behavior normal.        Thought Content: Thought content normal.        Judgment: Judgment normal.       Assessment & Plan:  1. Hyponatremia -Reviewed hospital notes,  discharge instructions, labs, and imaging.  All questions answered to the best of my ability.  We reviewed and confirmed her current medications in detail. - Basic Metabolic Panel; Future - Basic Metabolic Panel  2. COPD with acute exacerbation ([place]) - Continue Advair and will refer to pulmonary for PFTS - Basic Metabolic Panel; Future - Basic Metabolic Panel - Ambulatory referral to Pulmonology  3. Asymptomatic bacteriuria  - Basic Metabolic Panel; Future - Basic Metabolic Panel  4. Essential hypertension - She will check when she gets home to see if she is taking her spirolactone. She will follow up with me  - Basic Metabolic Panel; Future - Basic Metabolic Panel  [name], NP

## 2022-06-20 NOTE — Patient Instructions (Signed)
I am going to check your blood work today   Please make sure you have stopped HCTZ and started Spirolactone.   I am going to refer you to pulmonary for some testing

## 2022-07-09 ENCOUNTER — Emergency Department (HOSPITAL_COMMUNITY)
Admission: EM | Admit: 2022-07-09 | Discharge: 2022-07-09 | Disposition: A | Discharge status: Home / Self Care | Payer: PPO | Attending: Emergency Medicine | Admitting: Emergency Medicine

## 2022-07-09 ENCOUNTER — Encounter (HOSPITAL_COMMUNITY): Payer: Self-pay

## 2022-07-09 ENCOUNTER — Other Ambulatory Visit: Payer: Self-pay

## 2022-07-09 ENCOUNTER — Emergency Department (HOSPITAL_COMMUNITY): Payer: PPO

## 2022-07-09 DIAGNOSIS — R112 Nausea with vomiting, unspecified: Secondary | ICD-10-CM | POA: Insufficient documentation

## 2022-07-09 DIAGNOSIS — E119 Type 2 diabetes mellitus without complications: Secondary | ICD-10-CM | POA: Diagnosis not present

## 2022-07-09 DIAGNOSIS — R0602 Shortness of breath: Secondary | ICD-10-CM | POA: Diagnosis present

## 2022-07-09 DIAGNOSIS — Z7984 Long term (current) use of oral hypoglycemic drugs: Secondary | ICD-10-CM | POA: Insufficient documentation

## 2022-07-09 DIAGNOSIS — Z20822 Contact with and (suspected) exposure to covid-19: Secondary | ICD-10-CM | POA: Insufficient documentation

## 2022-07-09 DIAGNOSIS — I10 Essential (primary) hypertension: Secondary | ICD-10-CM | POA: Insufficient documentation

## 2022-07-09 DIAGNOSIS — Z79899 Other long term (current) drug therapy: Secondary | ICD-10-CM | POA: Diagnosis not present

## 2022-07-09 DIAGNOSIS — E871 Hypo-osmolality and hyponatremia: Secondary | ICD-10-CM | POA: Insufficient documentation

## 2022-07-09 LAB — RESP PANEL BY RT-PCR (FLU A&B, COVID) ARPGX2
Influenza A by PCR: NEGATIVE
Influenza B by PCR: NEGATIVE
SARS Coronavirus 2 by RT PCR: NEGATIVE

## 2022-07-09 LAB — CBC WITH DIFFERENTIAL/PLATELET
Abs Immature Granulocytes: 0.04 10*3/uL (ref 0.00–0.07)
Basophils Absolute: 0.1 10*3/uL (ref 0.0–0.1)
Basophils Relative: 1 %
Eosinophils Absolute: 0 10*3/uL (ref 0.0–0.5)
Eosinophils Relative: 0 %
HCT: 42.9 % (ref 36.0–46.0)
Hemoglobin: 14.4 g/dL (ref 12.0–15.0)
Immature Granulocytes: 1 %
Lymphocytes Relative: 20 %
Lymphs Abs: 1.5 10*3/uL (ref 0.7–4.0)
MCH: 28.3 pg (ref 26.0–34.0)
MCHC: 33.6 g/dL (ref 30.0–36.0)
MCV: 84.4 fL (ref 80.0–100.0)
Monocytes Absolute: 0.4 10*3/uL (ref 0.1–1.0)
Monocytes Relative: 5 %
Neutro Abs: 5.4 10*3/uL (ref 1.7–7.7)
Neutrophils Relative %: 73 %
Platelets: 307 10*3/uL (ref 150–400)
RBC: 5.08 MIL/uL (ref 3.87–5.11)
RDW: 14.2 % (ref 11.5–15.5)
WBC: 7.4 10*3/uL (ref 4.0–10.5)
nRBC: 0 % (ref 0.0–0.2)

## 2022-07-09 LAB — URINALYSIS, ROUTINE W REFLEX MICROSCOPIC
Bacteria, UA: NONE SEEN
Bilirubin Urine: NEGATIVE
Glucose, UA: NEGATIVE mg/dL
Hgb urine dipstick: NEGATIVE
Ketones, ur: NEGATIVE mg/dL
Leukocytes,Ua: NEGATIVE
Nitrite: NEGATIVE
Protein, ur: 100 mg/dL — AB
Specific Gravity, Urine: 1.008 (ref 1.005–1.030)
pH: 8 (ref 5.0–8.0)

## 2022-07-09 LAB — COMPREHENSIVE METABOLIC PANEL
ALT: 39 U/L (ref 0–44)
AST: 37 U/L (ref 15–41)
Albumin: 4.4 g/dL (ref 3.5–5.0)
Alkaline Phosphatase: 67 U/L (ref 38–126)
Anion gap: 15 (ref 5–15)
BUN: 18 mg/dL (ref 8–23)
CO2: 18 mmol/L — ABNORMAL LOW (ref 22–32)
Calcium: 10 mg/dL (ref 8.9–10.3)
Chloride: 97 mmol/L — ABNORMAL LOW (ref 98–111)
Creatinine, Ser: 0.84 mg/dL (ref 0.44–1.00)
GFR, Estimated: >60 mL/min (ref >60)
Glucose, Bld: 169 mg/dL — ABNORMAL HIGH (ref 70–99)
Potassium: 5.1 mmol/L (ref 3.5–5.1)
Sodium: 130 mmol/L — ABNORMAL LOW (ref 135–145)
Total Bilirubin: 0.8 mg/dL (ref 0.3–1.2)
Total Protein: 7.5 g/dL (ref 6.5–8.1)

## 2022-07-09 LAB — LIPASE, BLOOD: Lipase: 41 U/L (ref 11–51)

## 2022-07-09 MED ORDER — SODIUM CHLORIDE 0.9 % IV BOLUS
1000.0000 mL | Freq: Once | INTRAVENOUS | Status: AC
  Administered 2022-07-09: 1000 mL via INTRAVENOUS

## 2022-07-09 MED ORDER — ONDANSETRON HCL 4 MG/2ML IJ SOLN
4.0000 mg | Freq: Once | INTRAMUSCULAR | Status: AC
  Administered 2022-07-09: 4 mg via INTRAVENOUS
  Filled 2022-07-09: qty 2

## 2022-07-09 MED ORDER — ONDANSETRON HCL 4 MG PO TABS: 4.0000 mg | ORAL_TABLET | Freq: Three times a day (TID) | ORAL | 0 refills | Status: DC | PRN

## 2022-07-09 NOTE — ED Notes (Signed)
I was unable to get a temp on patient.  I made nurse aware.

## 2022-07-09 NOTE — ED Provider Notes (Signed)
[facility] DEPT Provider Note   CSN: [id number] Arrival date & time: 07/09/22  1510     History  Chief Complaint  Patient presents with   Shortness of Breath    Shelly Clark is a 76 y.o. female.  The patient presents to the hospital complaining of shortness of breath and vomiting.  The patient states that she started feeling nauseated last night.  She has felt short of breath throughout the day today and has been vomiting.  Patient's visitor at bedside states that she has vomited approximately 7 times since 3 PM.  The patient was hospitalized in mid July for hyponatremia.  Her initial presentation at that time of the emergency department was vomiting, diffuse abdominal pain, shortness of breath, and cough.  Her initial sodium at that visit was 115.  Patient has seen primary care since that time.  Her medications were altered after that hospitalization.  Patient denying chest pain, headache, urinary symptoms.  Past medical history significant for hospitalization for hyponatremia, osteoporosis, DM, hypertension, tobacco abuse, esophageal dysmotility, hyperlipidemia  HPI     Home Medications Prior to Admission medications   Medication Sig Start Date End Date Taking? Authorizing Provider  albuterol (VENTOLIN HFA) 108 (90 Base) MCG/ACT inhaler Inhale 1-2 puffs into the lungs every 6 (six) hours as needed for wheezing or shortness of breath. Patient taking differently: Inhale 1-2 puffs into the lungs See admin instructions. Inhale 1-2 puffs into the lungs every four to six hours as needed for shortness of breath or wheezing 06/10/22  Yes Maudie Flakes, MD  amLODipine (NORVASC) 10 MG tablet TAKE 1 TABLET(10 MG) BY MOUTH DAILY Patient taking differently: Take 10 mg by mouth daily. 05/06/21  Yes Nafziger, Tommi Rumps, NP  aspirin EC 81 MG tablet Take 81 mg by mouth daily as needed for mild pain. Swallow whole.   Yes [provider]  clonazePAM (KLONOPIN) 0.5 MG  tablet Take 1 tablet (0.5 mg total) by mouth at bedtime as needed. for sleep Patient taking differently: Take 0.5 mg by mouth daily as needed for anxiety (or sleep). 01/31/22  Yes Nafziger, Tommi Rumps, NP  FLUoxetine (PROZAC) 20 MG capsule Take 20 mg by mouth daily. 05/04/22  Yes [provider]  fluticasone-salmeterol (WIXELA INHUB) 100-50 MCG/ACT AEPB Inhale 1 puff into the lungs 2 (two) times daily.   Yes [provider]  glipiZIDE (GLUCOTROL XL) 5 MG 24 hr tablet TAKE 1 TABLET(5 MG) BY MOUTH DAILY WITH BREAKFAST Patient taking differently: Take 5 mg by mouth daily with breakfast. 06/15/22  Yes Nafziger, Tommi Rumps, NP  lisinopril (ZESTRIL) 40 MG tablet Take 1 tablet (40 mg total) by mouth daily. 08/30/21  Yes Nafziger, Tommi Rumps, NP  metFORMIN (GLUCOPHAGE) 500 MG tablet TAKE 1 TABLET BY MOUTH TWICE DAILY WITH A MEAL Patient taking differently: Take 500 mg by mouth daily with breakfast. 06/15/22  Yes Nafziger, Tommi Rumps, NP  Multiple Vitamin (MULTIVITAMIN WITH MINERALS) TABS Take 1 tablet by mouth daily with breakfast.   Yes [provider]  naproxen (NAPROSYN) 500 MG tablet Take 500 mg by mouth 2 (two) times daily as needed for mild pain.   Yes [provider]  NON FORMULARY Take 3 tablets by mouth See admin instructions. [place] tablets- Take 3 tablets by mouth with water up to three times a day, after meals- as needed for digestive/bowel issues   Yes [provider]  ondansetron (ZOFRAN) 4 MG tablet Take 1 tablet (4 mg total) by mouth every 8 (eight)  hours as needed for nausea or vomiting. 02/14/22  Yes Nafziger, Tommi Rumps, NP  ondansetron (ZOFRAN) 4 MG tablet Take 1 tablet (4 mg total) by mouth every 8 (eight) hours as needed for nausea or vomiting. 07/09/22  Yes [name], PA-C  simvastatin (ZOCOR) 20 MG tablet Take 1 tablet (20 mg total) by mouth daily. 08/30/21  Yes Nafziger, Tommi Rumps, NP  spironolactone (ALDACTONE) 25 MG tablet Take 1 tablet  by mouth daily. 06/14/22  Yes  Danford, Suann Loman Logan, MD  TYLENOL 500 MG tablet Take 500-1,000 mg by mouth every 6 (six) hours as needed for mild pain or headache.   Yes [provider]  B Complex-C (B-COMPLEX WITH VITAMIN C) tablet Take 1 tablet by mouth daily.    [provider]  fluticasone-salmeterol (ADVAIR) 100-50 MCG/ACT AEPB Inhale 1 puff into the lungs 2 times daily. Patient not taking: Reported on 07/09/2022 06/13/22   Edwin Dada, MD  nicotine (NICODERM CQ) 21 mg/24hr patch Place 1 patch (21 mg total) onto the skin daily. Patient not taking: Reported on 07/09/2022 05/09/22   [name], NP      Allergies    Codeine, Phenylpropanolamine, Prednisone, Tavist [clemastine], Nicoderm [nicotine], and Rocephin [ceftriaxone]    Review of Systems   Review of Systems  Constitutional:  Negative for fever.  Respiratory:  Positive for shortness of breath.   Cardiovascular:  Negative for chest pain.  Gastrointestinal:  Positive for abdominal pain, nausea and vomiting. Negative for constipation and diarrhea.  Genitourinary:  Negative for dysuria.  Neurological:  Negative for syncope and headaches.    Physical Exam Updated Vital Signs BP (!) 149/75   Pulse 81   Temp 98 F (36.7 C) (Oral)   Resp (!) 23   Ht 5' (1.524 m)   Wt 57.6 kg   SpO2 96%   BMI 24.80 kg/m  Physical Exam Vitals and nursing note reviewed.  Constitutional:      Appearance: She is normal weight.  HENT:     Head: Normocephalic and atraumatic.     Mouth/Throat:     Mouth: Mucous membranes are moist.  Eyes:     Extraocular Movements: Extraocular movements intact.  Cardiovascular:     Rate and Rhythm: Normal rate and regular rhythm.     Heart sounds: Normal heart sounds.  Pulmonary:     Effort: Pulmonary effort is normal.     Breath sounds: Normal breath sounds.  Abdominal:     Palpations: Abdomen is soft.     Tenderness: There is no abdominal tenderness.  Musculoskeletal:        General: Normal range of  motion.     Cervical back: Normal range of motion and neck supple.     Right lower leg: No edema.     Left lower leg: No edema.  Skin:    General: Skin is warm and dry.     Capillary Refill: Capillary refill takes less than 2 seconds.  Neurological:     Mental Status: She is alert and oriented to person, place, and time.     ED Results / Procedures / Treatments   Labs (all labs ordered are listed, but only abnormal results are displayed) Labs Reviewed  COMPREHENSIVE METABOLIC PANEL - Abnormal; Notable for the following components:      Result Value   Sodium 130 (*)    Chloride 97 (*)    CO2 18 (*)    Glucose, Bld 169 (*)    All other components within  normal limits  URINALYSIS, ROUTINE W REFLEX MICROSCOPIC - Abnormal; Notable for the following components:   Color, Urine STRAW (*)    Protein, ur 100 (*)    All other components within normal limits  RESP PANEL BY RT-PCR (FLU A&B, COVID) ARPGX2  LIPASE, BLOOD  CBC WITH DIFFERENTIAL/PLATELET  BRAIN NATRIURETIC PEPTIDE    EKG EKG Interpretation  Date/Time:  Sunday July 09 2022 15:40:28 EDT Ventricular Rate:  92 PR Interval:  164 QRS Duration: 80 QT Interval:  389 QTC Calculation: 482 R Axis:   -26 Text Interpretation: Sinus rhythm Borderline left axis deviation No acute changes No significant change since last tracing Confirmed by Varney Biles ([id number]) on 07/09/2022 6:39:31 PM  Radiology DG Chest 2 View  Result Date: 07/09/2022 CLINICAL DATA:  Shortness of breath. EXAM: CHEST - 2 VIEW COMPARISON:  Chest x-ray 01/27/2021 FINDINGS: The heart size and mediastinal contours are within normal limits. Both lungs are clear. The visualized skeletal structures are unremarkable. IMPRESSION: No active cardiopulmonary disease. Electronically Signed   By: Ronney Asters M.D.   On: 07/09/2022 16:50    Procedures Procedures    Medications Ordered in ED Medications  sodium chloride 0.9 % bolus 1,000 mL (1,000 mLs Intravenous New  Bag/Given 07/09/22 1728)  ondansetron (ZOFRAN) injection 4 mg (4 mg Intravenous Given 07/09/22 1727)    ED Course/ Medical Decision Making/ A&P Clinical Course as of 07/09/22 1948  Sun Jul 09, 2022  1814 Patient feeling much better after Zofran and saline [LM]    Clinical Course User Index [LM] Ronny Bacon                           Medical Decision Making Amount and/or Complexity of Data Reviewed Labs: ordered. Radiology: ordered.  Risk Prescription drug management.   This patient presents to the ED for concern of nausea and shortness of breath, this involves an extensive number of treatment options, and is a complaint that carries with it a high risk of complications and morbidity.  The differential diagnosis includes appendicitis, cholecystitis, pancreatitis, PE, and others   Co morbidities that complicate the patient evaluation  History of hyponatremia   Additional history obtained:  Additional history obtained from relatives at bedside External records from outside source obtained and reviewed including discharge summary from recent hospitalization   Lab Tests:  I Ordered, and personally interpreted labs.  The pertinent results include: Sodium 130 (consistent with sodium at last discharge), urinalysis negative for infection, lipase 41, negative respiratory panel, grossly normal CBC   Imaging Studies ordered:  I ordered imaging studies including chest x-ray I independently visualized and interpreted imaging which showed no active cardiopulmonary disease I agree with the radiologist interpretation   Cardiac Monitoring: / EKG:  The patient was maintained on a cardiac monitor.  I personally viewed and interpreted the cardiac monitored which showed an underlying rhythm of: Sinus rhythm   Problem List / ED Course / Critical interventions / Medication management   I ordered medication including Zofran for nausea Reevaluation of the patient after these  medicines showed that the patient improved I have reviewed the patients home medicines and have made adjustments as needed   Test / Admission - Considered:  The patient feels much better after fluids and Zofran administration.  No acute abdominal findings to suggest appendicitis, cholecystitis.  Lipase within normal limits suggesting appendicitis.  This could be a gastrointestinal type illness.  Her sodium is within a  reasonable level at this time.  Patient feels comfortable discharging home and I believe this is perfectly reasonable.  I will give her a short course of Zofran to help with nausea as needed.  Patient will follow-up with primary care and will keep her pulmonary appointment in 2 weeks.        Final Clinical Impression(s) / ED Diagnoses Final diagnoses:  Nausea and vomiting, unspecified vomiting type  SOB (shortness of breath)    Rx / DC Orders ED Discharge Orders          Ordered    ondansetron (ZOFRAN) 4 MG tablet  Every 8 hours PRN        07/09/22 1948              Ronny Bacon 07/09/22 1949    Varney Biles, MD 07/12/22 1531

## 2022-07-09 NOTE — ED Triage Notes (Signed)
Pt to ed c/o shortness of breath and vomiting. Pt diaphoretic in triage. Recent hospitalization for low sodium

## 2022-07-09 NOTE — Discharge Instructions (Signed)
You were seen today for nausea, vomiting, and shortness of breath.  Your symptoms improved greatly after fluids and Zofran administration.  This could be a viral illness.  I have prescribed a medication for nausea to be taken as prescribed.  I recommend taking it regularly for the first 24 hours and then as needed.  Please follow-up with your primary care for further evaluation and management.  As discussed, if you develop chest pain, shortness of breath, or worsening abdominal pain, return to the emergency department for further evaluation

## 2022-07-10 ENCOUNTER — Telehealth: Payer: Self-pay | Admitting: *Deleted

## 2022-07-10 NOTE — Telephone Encounter (Signed)
     Patient  visit on 07/09/2022  at [facility]  was for Nausea  Have you been able to follow up with your primary care physician? She will today   The patient was able to obtain any needed medicine or equipment. Patient said feeling much better and she was going to reach out to pcp   Are there diet recommendations that you are having difficulty following? NA  Patient expresses understanding of discharge instructions and education provided has no other needs at this time  [address] , [place] [id number] Email : [email or web address]

## 2022-07-11 ENCOUNTER — Encounter: Payer: Self-pay | Admitting: Adult Health

## 2022-07-11 DIAGNOSIS — E785 Hyperlipidemia, unspecified: Secondary | ICD-10-CM

## 2022-07-11 MED ORDER — SIMVASTATIN 20 MG PO TABS: 20.0000 mg | ORAL_TABLET | Freq: Every day | ORAL | 3 refills | Status: DC

## 2022-07-13 ENCOUNTER — Other Ambulatory Visit: Payer: Self-pay | Admitting: Adult Health

## 2022-07-14 ENCOUNTER — Other Ambulatory Visit: Payer: Self-pay | Admitting: Adult Health

## 2022-07-17 ENCOUNTER — Other Ambulatory Visit: Payer: Self-pay | Admitting: Adult Health

## 2022-07-17 MED ORDER — SPIRONOLACTONE 25 MG PO TABS: 25.0000 mg | ORAL_TABLET | Freq: Every day | ORAL | 0 refills | Status: DC

## 2022-07-18 ENCOUNTER — Ambulatory Visit (INDEPENDENT_AMBULATORY_CARE_PROVIDER_SITE_OTHER): Payer: PPO | Admitting: Adult Health

## 2022-07-18 ENCOUNTER — Encounter: Payer: Self-pay | Admitting: Adult Health

## 2022-07-18 ENCOUNTER — Other Ambulatory Visit: Payer: Self-pay | Admitting: Adult Health

## 2022-07-18 VITALS — BP 130/64 | HR 73 | Temp 98.0°F | Wt 129.0 lb

## 2022-07-18 DIAGNOSIS — G4700 Insomnia, unspecified: Secondary | ICD-10-CM

## 2022-07-18 DIAGNOSIS — F4323 Adjustment disorder with mixed anxiety and depressed mood: Secondary | ICD-10-CM | POA: Diagnosis not present

## 2022-07-18 DIAGNOSIS — R1110 Vomiting, unspecified: Secondary | ICD-10-CM

## 2022-07-18 MED ORDER — FLUOXETINE HCL 40 MG PO CAPS: 40.0000 mg | ORAL_CAPSULE | Freq: Every day | ORAL | 1 refills | Status: DC

## 2022-07-18 MED ORDER — CLONAZEPAM 0.5 MG PO TABS: 0.5000 mg | ORAL_TABLET | Freq: Two times a day (BID) | ORAL | 2 refills | Status: DC | PRN

## 2022-07-18 NOTE — Patient Instructions (Signed)
I am going to increase your Prozac to 40 mg and have you take a dose of klonopin if needed during the afternoon to help with anxiety

## 2022-07-18 NOTE — Progress Notes (Unsigned)
Subjective:    Patient ID: Shelly Clark, female    DOB: [birth year], 76 y.o.   MRN: [id number]  HPI 76 year old female who  has a past medical history of Benign hematuria, Diabetes mellitus without complication ([place]), Esophageal dysmotility, Hyperlipidemia, Hypertension, Migraines, Nicotine dependence, cigarettes, uncomplicated (66/29/4765), Osteoporosis, and Tobacco abuse.  She presents to the office today for follow-up after being seen in the emergency room approximately 9 days ago.  She was complaining of shortness of breath and vomiting throughout that day.  She had vomited approximately 7 times prior to being seen.  She was hospitalized in mid July for hyponatremia and at that time her symptoms were similar.  Her labs including sodium were reassuring, sodium level is 130.  She was given fluids and Zofran and reported that her symptoms improved.  It was thought that she had a GI bug.  Today she reports that she has not had any additional vomiting or shortness of breath since being discharged but her anxiety level has increased since previous ER visit.  Anxiety also increased after her hospital admission in December 2021 for sepsis from a UTI.  She feels as though anything that goes wrong with her body she believes that she is can end up back in the hospital and this scares her.  She is taking her Prozac 20 mg daily, during one of her panic attacks over the last week she took a afternoon dose of her Klonopin and this seemed to improve her symptoms.  In the past she was only taking Klonopin at night to help with sleep.  Review of Systems See HPI   Past Medical History:  Diagnosis Date   Benign hematuria    neg urology workup   Diabetes mellitus without complication ([place])    Esophageal dysmotility    Hyperlipidemia    Hypertension    Migraines    Nicotine dependence, cigarettes, uncomplicated 46/50/3546   Osteoporosis    Tobacco abuse     Social History   Socioeconomic History    Marital status: Married    Spouse name: Not on file   Number of children: Not on file   Years of education: Not on file   Highest education level: Not on file  Occupational History   Occupation: OWNER    Employer: [employer]    Comment: Restaurant  Tobacco Use   Smoking status: Every Day    Packs/day: 2.00    Years: 40.00    Total pack years: 80.00    Types: Cigarettes   Smokeless tobacco: Never  Vaping Use   Vaping Use: Never used  Substance and Sexual Activity   Alcohol use: No   Drug use: No   Sexual activity: Not on file  Other Topics Concern   Not on file  Social History Narrative   ** Merged History Encounter **       Owns a home  She is married  Two children      Social Determinants of Health   Financial Resource Strain: Low Risk  (05/04/2021)   Overall Financial Resource Strain (CARDIA)    Difficulty of Paying Living Expenses: Not hard at all  Food Insecurity: No Food Insecurity (05/04/2021)   Hunger Vital Sign    Worried About Running Out of Food in the Last Year: Never true    [place] in the Last Year: Never true  Transportation Needs: No Transportation Needs (05/04/2021)   PRAPARE - Transportation    Lack of  Transportation (Medical): No    Lack of Transportation (Non-Medical): No  Physical Activity: Sufficiently Active (05/04/2021)   Exercise Vital Sign    Days of Exercise per Week: 5 days    Minutes of Exercise per Session: 60 min  Stress: No Stress Concern Present (05/04/2021)   [place]    Feeling of Stress : Not at all  Social Connections: [place] (05/04/2021)   Social Connection and Isolation Panel [NHANES]    Frequency of Communication with Friends and Family: Three times a week    Frequency of Social Gatherings with Friends and Family: Not on file    Attends Religious Services: More than 4 times per year    Active Member of [employer] or Organizations: Yes    Attends English as a second language teacher Meetings: Never    Marital Status: Married  Human resources officer Violence: Not At Risk (05/04/2021)   Humiliation, Afraid, Rape, and Kick questionnaire    Fear of Current or Ex-Partner: No    Emotionally Abused: No    Physically Abused: No    Sexually Abused: No    Past Surgical History:  Procedure Laterality Date   ABDOMINAL HYSTERECTOMY     MOUTH SURGERY      Family History  Problem Relation Age of Onset   Ovarian cancer Mother    Diabetes Mother    Hypertension Mother    Hyperlipidemia Mother    Diabetes Father    Hypertension Father    Hyperlipidemia Father    Hypertension Brother    Hypertension Daughter    Hypertension Daughter     Allergies  Allergen Reactions   Codeine Nausea And Vomiting   Phenylpropanolamine Nausea And Vomiting and Other (See Comments)    "Tavist-D"   Prednisone Nausea Only and Other (See Comments)    Can tolerate via IV, but not orally   Tavist [Clemastine] Nausea And Vomiting    "Tavist-D"   Nicoderm [Nicotine] Rash and Other (See Comments)    Patient said she is ALLERGIC to the adhesive   Rocephin [Ceftriaxone] Rash and Other (See Comments)    06/11/22 got "redness on the neck and face" after an infusion of Rocephin    Current Outpatient Medications on File Prior to Visit  Medication Sig Dispense Refill   albuterol (VENTOLIN HFA) 108 (90 Base) MCG/ACT inhaler Inhale 1-2 puffs into the lungs every 6 (six) hours as needed for wheezing or shortness of breath. (Patient taking differently: Inhale 1-2 puffs into the lungs See admin instructions. Inhale 1-2 puffs into the lungs every four to six hours as needed for shortness of breath or wheezing) 108 g 0   amLODipine (NORVASC) 10 MG tablet TAKE 1 TABLET(10 MG) BY MOUTH DAILY (Patient taking differently: Take 10 mg by mouth daily.) 90 tablet 3   aspirin EC 81 MG tablet Take 81 mg by mouth daily as needed for mild pain. Swallow whole.     B Complex-C (B-COMPLEX WITH VITAMIN C) tablet  Take 1 tablet by mouth daily.     clonazePAM (KLONOPIN) 0.5 MG tablet Take 1 tablet (0.5 mg total) by mouth 2 (two) times daily as needed. for sleep and anxiety 60 tablet 2   FLUoxetine (PROZAC) 40 MG capsule Take 1 capsule (40 mg total) by mouth daily. 90 capsule 1   fluticasone-salmeterol (ADVAIR) 100-50 MCG/ACT AEPB Inhale 1 puff into the lungs 2 times daily. 60 each 0   fluticasone-salmeterol (WIXELA INHUB) 100-50 MCG/ACT AEPB Inhale 1 puff  into the lungs 2 (two) times daily.     glipiZIDE (GLUCOTROL XL) 5 MG 24 hr tablet TAKE 1 TABLET(5 MG) BY MOUTH DAILY WITH BREAKFAST (Patient taking differently: Take 5 mg by mouth daily with breakfast.) 90 tablet 0   lisinopril (ZESTRIL) 40 MG tablet Take 1 tablet (40 mg total) by mouth daily. 90 tablet 3   metFORMIN (GLUCOPHAGE) 500 MG tablet TAKE 1 TABLET BY MOUTH TWICE DAILY WITH A MEAL (Patient taking differently: Take 500 mg by mouth daily with breakfast.) 180 tablet 0   Multiple Vitamin (MULTIVITAMIN WITH MINERALS) TABS Take 1 tablet by mouth daily with breakfast.     naproxen (NAPROSYN) 500 MG tablet Take 500 mg by mouth 2 (two) times daily as needed for mild pain.     nicotine (NICODERM CQ) 21 mg/24hr patch Place 1 patch (21 mg total) onto the skin daily. 28 patch 11   NON FORMULARY Take 3 tablets by mouth See admin instructions. Seirogan tablets- Take 3 tablets by mouth with water up to three times a day, after meals- as needed for digestive/bowel issues     ondansetron (ZOFRAN) 4 MG tablet Take 1 tablet (4 mg total) by mouth every 8 (eight) hours as needed for nausea or vomiting. 20 tablet 1   ondansetron (ZOFRAN) 4 MG tablet Take 1 tablet (4 mg total) by mouth every 8 (eight) hours as needed for nausea or vomiting. 20 tablet 0   simvastatin (ZOCOR) 20 MG tablet Take 1 tablet (20 mg total) by mouth daily. 90 tablet 3   spironolactone (ALDACTONE) 25 MG tablet Take 1 tablet (25 mg total) by mouth daily. 30 tablet 0   TYLENOL 500 MG tablet Take  500-1,000 mg by mouth every 6 (six) hours as needed for mild pain or headache.     No current facility-administered medications on file prior to visit.    There were no vitals taken for this visit.      Objective:   Physical Exam        Assessment & Plan:

## 2022-07-18 NOTE — Progress Notes (Signed)
Subjective:    Patient ID: Shelly Clark, female    DOB: 1946-03-31, 76 y.o.   MRN: [id number]  HPI 76 year old female who  has a past medical history of Benign hematuria, Diabetes mellitus without complication ([place]), Esophageal dysmotility, Hyperlipidemia, Hypertension, Migraines, Nicotine dependence, cigarettes, uncomplicated (93/79/0240), Osteoporosis, and Tobacco abuse.  She presents to the office today for follow-up after being seen in the emergency room 9 days ago for shortness of breath and vomiting.  She had vomited 7 times that day and was concerned that she may be having an issue with her sodium level as her symptoms were similar when she was hospitalized July 2023 for hyponatremia.  Thankfully her work-up in the emergency room this time was unremarkable, sodium level was 130.  She was given IV fluids and Zofran and her symptoms improved.  She reports that since being discharged she has not had any shortness of breath or vomiting.  She has increased panic attacks and anxiety which started after hospitalization in December 2022 for sepsis from UTI.  Her anxiety level went up after hospital admission for hyponatremia and just seems to be getting worse after each subsequent hospital visit.  She is taking her Prozac 20 mg daily.  This week during a panic attack she took extra dose of Klonopin and this seemed to relieve her symptoms.   Review of Systems See HPI   Past Medical History:  Diagnosis Date   Benign hematuria    neg urology workup   Diabetes mellitus without complication ([place])    Esophageal dysmotility    Hyperlipidemia    Hypertension    Migraines    Nicotine dependence, cigarettes, uncomplicated 97/35/3299   Osteoporosis    Tobacco abuse     Social History   Socioeconomic History   Marital status: Married    Spouse name: Not on file   Number of children: Not on file   Years of education: Not on file   Highest education level: Not on file  Occupational History    Occupation: OWNER    Employer: [employer]    Comment: Restaurant  Tobacco Use   Smoking status: Every Day    Packs/day: 2.00    Years: 40.00    Total pack years: 80.00    Types: Cigarettes   Smokeless tobacco: Never  Vaping Use   Vaping Use: Never used  Substance and Sexual Activity   Alcohol use: No   Drug use: No   Sexual activity: Not on file  Other Topics Concern   Not on file  Social History Narrative   ** Merged History Encounter **       Owns a home  She is married  Two children      Social Determinants of Health   Financial Resource Strain: Low Risk  (05/04/2021)   Overall Financial Resource Strain (CARDIA)    Difficulty of Paying Living Expenses: Not hard at all  Food Insecurity: No Food Insecurity (05/04/2021)   Hunger Vital Sign    Worried About Running Out of Food in the Last Year: Never true    [place] in the Last Year: Never true  Transportation Needs: No Transportation Needs (05/04/2021)   PRAPARE - Hydrologist (Medical): No    Lack of Transportation (Non-Medical): No  Physical Activity: Sufficiently Active (05/04/2021)   Exercise Vital Sign    Days of Exercise per Week: 5 days    Minutes of Exercise per Session:  60 min  Stress: No Stress Concern Present (05/04/2021)   [place]    Feeling of Stress : Not at all  Social Connections: [place] (05/04/2021)   Social Connection and Isolation Panel [NHANES]    Frequency of Communication with Friends and Family: Three times a week    Frequency of Social Gatherings with Friends and Family: Not on file    Attends Religious Services: More than 4 times per year    Active Member of [employer] or Organizations: Yes    Attends Archivist Meetings: Never    Marital Status: Married  Human resources officer Violence: Not At Risk (05/04/2021)   Humiliation, Afraid, Rape, and Kick questionnaire    Fear of Current or  Ex-Partner: No    Emotionally Abused: No    Physically Abused: No    Sexually Abused: No    Past Surgical History:  Procedure Laterality Date   ABDOMINAL HYSTERECTOMY     MOUTH SURGERY      Family History  Problem Relation Age of Onset   Ovarian cancer Mother    Diabetes Mother    Hypertension Mother    Hyperlipidemia Mother    Diabetes Father    Hypertension Father    Hyperlipidemia Father    Hypertension Brother    Hypertension Daughter    Hypertension Daughter     Allergies  Allergen Reactions   Codeine Nausea And Vomiting   Phenylpropanolamine Nausea And Vomiting and Other (See Comments)    "Tavist-D"   Prednisone Nausea Only and Other (See Comments)    Can tolerate via IV, but not orally   Tavist [Clemastine] Nausea And Vomiting    "Tavist-D"   Nicoderm [Nicotine] Rash and Other (See Comments)    Patient said she is ALLERGIC to the adhesive   Rocephin [Ceftriaxone] Rash and Other (See Comments)    06/11/22 got "redness on the neck and face" after an infusion of Rocephin    Current Outpatient Medications on File Prior to Visit  Medication Sig Dispense Refill   albuterol (VENTOLIN HFA) 108 (90 Base) MCG/ACT inhaler Inhale 1-2 puffs into the lungs every 6 (six) hours as needed for wheezing or shortness of breath. (Patient taking differently: Inhale 1-2 puffs into the lungs See admin instructions. Inhale 1-2 puffs into the lungs every four to six hours as needed for shortness of breath or wheezing) 108 g 0   amLODipine (NORVASC) 10 MG tablet TAKE 1 TABLET(10 MG) BY MOUTH DAILY (Patient taking differently: Take 10 mg by mouth daily.) 90 tablet 3   aspirin EC 81 MG tablet Take 81 mg by mouth daily as needed for mild pain. Swallow whole.     B Complex-C (B-COMPLEX WITH VITAMIN C) tablet Take 1 tablet by mouth daily.     fluticasone-salmeterol (ADVAIR) 100-50 MCG/ACT AEPB Inhale 1 puff into the lungs 2 times daily. 60 each 0   fluticasone-salmeterol (WIXELA INHUB) 100-50  MCG/ACT AEPB Inhale 1 puff into the lungs 2 (two) times daily.     glipiZIDE (GLUCOTROL XL) 5 MG 24 hr tablet TAKE 1 TABLET(5 MG) BY MOUTH DAILY WITH BREAKFAST (Patient taking differently: Take 5 mg by mouth daily with breakfast.) 90 tablet 0   lisinopril (ZESTRIL) 40 MG tablet Take 1 tablet (40 mg total) by mouth daily. 90 tablet 3   metFORMIN (GLUCOPHAGE) 500 MG tablet TAKE 1 TABLET BY MOUTH TWICE DAILY WITH A MEAL (Patient taking differently: Take 500 mg by mouth daily  with breakfast.) 180 tablet 0   Multiple Vitamin (MULTIVITAMIN WITH MINERALS) TABS Take 1 tablet by mouth daily with breakfast.     naproxen (NAPROSYN) 500 MG tablet Take 500 mg by mouth 2 (two) times daily as needed for mild pain.     nicotine (NICODERM CQ) 21 mg/24hr patch Place 1 patch (21 mg total) onto the skin daily. 28 patch 11   NON FORMULARY Take 3 tablets by mouth See admin instructions. Seirogan tablets- Take 3 tablets by mouth with water up to three times a day, after meals- as needed for digestive/bowel issues     ondansetron (ZOFRAN) 4 MG tablet Take 1 tablet (4 mg total) by mouth every 8 (eight) hours as needed for nausea or vomiting. 20 tablet 1   ondansetron (ZOFRAN) 4 MG tablet Take 1 tablet (4 mg total) by mouth every 8 (eight) hours as needed for nausea or vomiting. 20 tablet 0   simvastatin (ZOCOR) 20 MG tablet Take 1 tablet (20 mg total) by mouth daily. 90 tablet 3   spironolactone (ALDACTONE) 25 MG tablet Take 1 tablet (25 mg total) by mouth daily. 30 tablet 0   TYLENOL 500 MG tablet Take 500-1,000 mg by mouth every 6 (six) hours as needed for mild pain or headache.     No current facility-administered medications on file prior to visit.    BP 130/64   Pulse 73   Temp 98 F (36.7 C) (Oral)   Wt 129 lb (58.5 kg)   SpO2 97%   BMI 25.19 kg/m       Objective:   Physical Exam Vitals and nursing note reviewed.  Constitutional:      Appearance: Normal appearance.  Cardiovascular:     Rate and  Rhythm: Normal rate and regular rhythm.     Pulses: Normal pulses.     Heart sounds: Normal heart sounds.  Pulmonary:     Effort: Pulmonary effort is normal.     Breath sounds: Normal breath sounds.  Musculoskeletal:        General: Normal range of motion.  Skin:    General: Skin is warm and dry.  Neurological:     General: No focal deficit present.     Mental Status: She is alert.  Psychiatric:        Mood and Affect: Mood normal.        Behavior: Behavior normal.        Thought Content: Thought content normal.        Judgment: Judgment normal.        Assessment & Plan:   1. Vomiting, unspecified vomiting type, unspecified whether nausea present - - resolved. We reviewed her imaging and labs from this ER visit. All questions answered to the best of my ability   2. Adjustment disorder with mixed anxiety and depressed mood - Will increase PRozac to 40 mg daily. She can take a dose of klonopin in the afternoon if needed - FLUoxetine (PROZAC) 40 MG capsule; Take 1 capsule (40 mg total) by mouth daily.  Dispense: 90 capsule; Refill: 1 - clonazePAM (KLONOPIN) 0.5 MG tablet; Take 1 tablet (0.5 mg total) by mouth 2 (two) times daily as needed. for sleep and anxiety  Dispense: 60 tablet; Refill: 2  3. Insomnia, unspecified type  - clonazePAM (KLONOPIN) 0.5 MG tablet; Take 1 tablet (0.5 mg total) by mouth 2 (two) times daily as needed. for sleep and anxiety  Dispense: 60 tablet; Refill: 2   [name], NP

## 2022-07-24 ENCOUNTER — Ambulatory Visit: Payer: PPO | Admitting: Internal Medicine

## 2022-07-24 ENCOUNTER — Encounter: Payer: Self-pay | Admitting: Internal Medicine

## 2022-07-24 VITALS — BP 124/78 | HR 81 | Temp 98.5°F | Ht 60.0 in | Wt 130.0 lb

## 2022-07-24 DIAGNOSIS — R0602 Shortness of breath: Secondary | ICD-10-CM

## 2022-07-24 DIAGNOSIS — F17210 Nicotine dependence, cigarettes, uncomplicated: Secondary | ICD-10-CM | POA: Diagnosis not present

## 2022-07-24 DIAGNOSIS — Z122 Encounter for screening for malignant neoplasm of respiratory organs: Secondary | ICD-10-CM

## 2022-07-24 DIAGNOSIS — F17200 Nicotine dependence, unspecified, uncomplicated: Secondary | ICD-10-CM

## 2022-07-24 NOTE — Progress Notes (Signed)
Shelly Clark    [id number]    09/18/1946  Primary Care Physician:Nafziger, Tommi Rumps, NP  Referring Physician: [name], NP [address] [place],  [place] [address] Reason for Consultation: shortness of breath Date of Consultation: 07/24/2022  Chief complaint:   Chief Complaint  Patient presents with   Consult    She has some shortness of breath and per the ER they wanted her to get checked out. She feels ok other wise.       HPI: Shelly Clark  is a  76 y.o. woman with tobacco use disorder who presents for new patient evaluation of shortness of breath. She has had multiple ED visits (twice in the last 6 months) for breathing problems. One of these issues she says was a panic attack and another was dehydration. She has occasional dyspnea with her daily activities. She denies daily coughing. She does have occasional wheezing.   She denies recurrent pna or bronchitis previous to these two episodes she had this year.  No childhood respiratory disease.   About a month ago she started on albuterol inhaler and advair. She takes this about twice a day with stress or exertion.   She doesn't like the advair because she feels like she doesn't get the medication in.   She has had adverse effects to chantix and the nicotine patch gives her a rash.    Social history:  Occupation: She owns [employer].  Exposures: lives at home with multiple family members, none of whom smoke.  Smoking history: 1 pack per day X 60 years. She quit when she was pregnant with her children.  Social History   Occupational History   Occupation: Information systems manager: [employer]    Comment: Restaurant  Tobacco Use   Smoking status: Every Day    Packs/day: 2.00    Years: 40.00    Total pack years: 80.00    Types: Cigarettes   Smokeless tobacco: Never   Tobacco comments:    1ppd  Vaping Use   Vaping Use: Never used  Substance and Sexual Activity   Alcohol use: No   Drug use:  No   Sexual activity: Not on file    Relevant family history:  Family History  Problem Relation Age of Onset   Ovarian cancer Mother    Diabetes Mother    Hypertension Mother    Hyperlipidemia Mother    Diabetes Father    Hypertension Father    Hyperlipidemia Father    Hypertension Brother    Hypertension Daughter    Hypertension Daughter     Past Medical History:  Diagnosis Date   Benign hematuria    neg urology workup   Diabetes mellitus without complication ([place])    Esophageal dysmotility    Hyperlipidemia    Hypertension    Migraines    Nicotine dependence, cigarettes, uncomplicated 23/76/2831   Osteoporosis    Tobacco abuse     Past Surgical History:  Procedure Laterality Date   ABDOMINAL HYSTERECTOMY     MOUTH SURGERY       Physical Exam: Blood pressure 124/78, pulse 81, temperature 98.5 F (36.9 C), temperature source Oral, height 5' (1.524 m), weight 130 lb (59 kg), SpO2 95 %. Gen:      No acute distress ENT:  no nasal polyps, mucus membranes moist Lungs:    No increased respiratory effort, symmetric chest wall excursion, clear to auscultation bilaterally, no wheezes or crackles  CV:         Regular rate and rhythm; no murmurs, rubs, or gallops.  No pedal edema Abd:      + bowel sounds; soft, non-tender; no distension MSK: no acute synovitis of DIP or PIP joints, no mechanics hands.  Skin:      Warm and dry; no rashes Neuro: normal speech, no focal facial asymmetry Psych: alert and oriented x3, normal mood and affect   Data Reviewed/Medical Decision Making:  Independent interpretation of tests: Imaging:  Review of patient's chest xray images revealed no acute process. The patient's images have been independently reviewed by me.    PFTs:  Labs:  Lab Results  Component Value Date   WBC 7.4 07/09/2022   HGB 14.4 07/09/2022   HCT 42.9 07/09/2022   MCV 84.4 07/09/2022   PLT 307 07/09/2022   Lab Results  Component Value Date   NA 130 (L)  07/09/2022   K 5.1 07/09/2022   CL 97 (L) 07/09/2022   CO2 18 (L) 07/09/2022     Immunization status:  Immunization History  Administered Date(s) Administered   Fluad Quad(high Dose 65+) 11/04/2019, 10/05/2021   Influenza Whole 09/06/2007, 11/25/2009   Influenza, High Dose Seasonal PF 10/05/2016, 10/26/2017   PFIZER(Purple Top)SARS-COV-2 Vaccination 12/31/2019, 01/21/2020   Pneumococcal Conjugate-13 02/24/2015   Pneumococcal Polysaccharide-23 10/26/2017   Td 02/24/2015   Zoster, Live 01/10/2010     I reviewed prior external note(s) from ED visit.   I reviewed the result(s) of the labs and imaging as noted above.   I have ordered PFT   Assessment:  Probable COPD Tobacco use disorder Need for lung cancer screening.   Plan/Recommendations:  Full set of PFTs - 1 hour (can be same day or different day based on preference.   Continue the advair twice daily, albuterol inhaler as needed. You just started these so let's give it some more time.   Will refer to lung cancer screening program for LDCT enrollment.   Smoking Cessation Counseling:  1. The patient is an everyday smoker and symptomatic due to the following condition copd 2. The patient is currently pre-contemplative in quitting smoking. 3. I advised patient to quit smoking. 4. We identified patient specific barriers to change.  5. I personally spent 3 minutes counseling the patient regarding tobacco use disorder. 6. We discussed management of stress and anxiety to help with smoking cessation, when applicable. 7. We discussed nicotine replacement therapy, Wellbutrin, Chantix as possible options. 8. I advised setting a quit date. 9. Follow?up arranged with our office to continue ongoing discussions. 10.Resources given to patient including quit hotline.    We discussed disease management and progression at length today regarding COPD.     Return to Care: No follow-ups on file.  Lenice Llamas, MD Pulmonary and  [place]  CC: [name], NP

## 2022-07-24 NOTE — Patient Instructions (Addendum)
Please schedule follow up scheduled with myself in 2 months.  If my schedule is not open yet, we will contact you with a reminder closer to that time. Please call [phone] if you haven't heard from [place] a month before.   Before your next visit I would like you to have: Full set of PFTs - 1 hour (can be same day or different day based on preference.   Continue the advair twice daily, albuterol inhaler as needed. You just started these so let's give it some more time.   What are the benefits of quitting smoking? Quitting smoking can lower your chances of getting or dying from heart disease, lung disease, kidney failure, infection, or cancer. It can also lower your chances of getting osteoporosis, a condition that makes your bones weak. Plus, quitting smoking can help your skin look younger and reduce the chances that you will have problems with sex.  Quitting smoking will improve your health no matter how old you are, and no matter how long or how much you have smoked.  What should I do if I want to quit smoking? The letters in the word "START" can help you remember the steps to take: S = Set a quit date. T = Tell family, friends, and the people around you that you plan to quit. A = Anticipate or plan ahead for the tough times you'll face while quitting. R = Remove cigarettes and other tobacco products from your home, car, and work. T = Talk to your doctor about getting help to quit.  How can my doctor or nurse help? Your doctor or nurse can give you advice on the best way to quit. He or she can also put you in touch with counselors or other people you can call for support. Plus, your doctor or nurse can give you medicines to: ?Reduce your craving for cigarettes ?Reduce the unpleasant symptoms that happen when you stop smoking (called "withdrawal symptoms"). You can also get help from a free phone line (1-800-QUIT-NOW) or go online to [facility].  What are the symptoms of  withdrawal? The symptoms include: ?Trouble sleeping ?Being irritable, anxious or restless ?Getting frustrated or angry ?Having trouble thinking clearly  Some people who stop smoking become temporarily depressed. Some people need treatment for depression, such as counseling or antidepressant medicines. Depressed people might: ?No longer enjoy or care about doing the things they used to like to do ?Feel sad, down, hopeless, nervous, or cranky most of the day, almost every day ?Lose or gain weight ?Sleep too much or too little ?Feel tired or like they have no energy ?Feel guilty or like they are worth nothing ?Forget things or feel confused ?Move and speak more slowly than usual ?Act restless or have trouble staying still ?Think about death or suicide  If you think you might be depressed, see your doctor or nurse. Only someone trained in mental health can tell for sure if you are depressed. If you ever feel like you might hurt yourself, go straight to the nearest emergency department. Or you can call for an ambulance (in the [place] and [place], [place] 9-1-1) or call your doctor or nurse right away and tell them it is an emergency. You can also reach the [place] National Suicide Prevention Lifeline at 815-531-0250 or http://walker-sanchez.info/.  How do medicines help you stop smoking? Different medicines work in different ways: ?Nicotine replacement therapy eases withdrawal and reduces your body's craving for nicotine, the main drug found in cigarettes. There  are different forms of nicotine replacement, including skin patches, lozenges, gum, nasal sprays, and "puffers" or inhalers. Many can be bought without a prescription, while others might require one. ?Bupropion is a prescription medicine that reduces your desire to smoke. This medicine is sold under the brand names Zyban and Wellbutrin. It is also available in a generic version, which is cheaper than brand name medicines. ?Varenicline (brand  names: Chantix, Champix) is a prescription medicine that reduces withdrawal symptoms and cigarette cravings. If you think you'd like to take varenicline and you have a history of depression, anxiety, or heart disease, discuss this with your doctor or nurse before taking the medicine. Varenicline can also increase the effects of alcohol in some people. It's a good idea to limit drinking while you're taking it, at least until you know how it affects you.  How does counseling work? Counseling can happen during formal office visits or just over the phone. A counselor can help you: ?Figure out what triggers your smoking and what to do instead ?Overcome cravings ?Figure out what went wrong when you tried to quit before  What works best? Studies show that people have the best luck at quitting if they take medicines to help them quit and work with a Social worker. It might also be helpful to combine nicotine replacement with one of the prescription medicines that help people quit. In some cases, it might even make sense to take bupropion and varenicline together.  What about e-cigarettes? Sometimes people wonder if using electronic cigarettes, or "e-cigarettes," might help them quit smoking. Using e-cigarettes is also called "vaping." Doctors do not recommend e-cigarettes in place of medicines and counseling. That's because e-cigarettes still contain nicotine as well as other substances that might be harmful. It's not clear how they can affect a person's health in the long term.  Will I gain weight if I quit? Yes, you might gain a few pounds. But quitting smoking will have a much more positive effect on your health than weighing a few pounds more. Plus, you can help prevent some weight gain by being more active and eating less. Taking the medicine bupropion might help control weight gain.   What else can I do to improve my chances of quitting? You can: ?Start exercising. ?Stay away from smokers and places  that you associate with smoking. If people close to you smoke, ask them to quit with you. ?Keep gum, hard candy, or something to put in your mouth handy. If you get a craving for a cigarette, try one of these instead. ?Don't give up, even if you start smoking again. It takes most people a few tries before they succeed.  What if I am pregnant and I smoke? If you are pregnant, it's really important for the health of your baby that you quit. Ask your doctor what options you have, and what is safest for your baby

## 2022-07-25 ENCOUNTER — Other Ambulatory Visit: Payer: Self-pay | Admitting: Adult Health

## 2022-07-26 ENCOUNTER — Encounter: Payer: Self-pay | Admitting: Adult Health

## 2022-07-26 DIAGNOSIS — I10 Essential (primary) hypertension: Secondary | ICD-10-CM

## 2022-07-26 MED ORDER — AMLODIPINE BESYLATE 10 MG PO TABS: 10.0000 mg | ORAL_TABLET | Freq: Every day | ORAL | 2 refills | Status: DC

## 2022-07-28 ENCOUNTER — Other Ambulatory Visit: Payer: Self-pay | Admitting: Adult Health

## 2022-07-28 DIAGNOSIS — I10 Essential (primary) hypertension: Secondary | ICD-10-CM

## 2022-08-02 ENCOUNTER — Ambulatory Visit (INDEPENDENT_AMBULATORY_CARE_PROVIDER_SITE_OTHER): Payer: PPO

## 2022-08-02 VITALS — Ht 60.0 in | Wt 130.0 lb

## 2022-08-02 DIAGNOSIS — Z0000 Encounter for general adult medical examination without abnormal findings: Secondary | ICD-10-CM

## 2022-08-02 NOTE — Patient Instructions (Addendum)
[name] , Thank you for taking time to come for your Medicare Wellness Visit. I appreciate your ongoing commitment to your health goals. Please review the following plan we discussed and let me know if I can assist you in the future.   Screening recommendations/referrals: Colonoscopy: Done 09/02/16 Repeat 10 yrs Mammogram: Patient Deferred Bone Density: Patient Deferred Recommended yearly ophthalmology/optometry visit for glaucoma screening and checkup Recommended yearly dental visit for hygiene and checkup  Vaccinations: Influenza vaccine: Up to date Pneumococcal vaccine: Up to date Tdap vaccine: Up to date Shingles vaccine: Deferred   Covid-19:Done  Advanced directives: Advance directive discussed with you today. Even though you declined this today, please call our office should you change your mind, and we can give you the proper paperwork for you to fill out.   Conditions/risks identified: None  Next appointment: Follow up in one year for your annual wellness visit      Preventive Care 65 Years and Older, Female Preventive care refers to lifestyle choices and visits with your health care provider that can promote health and wellness. What does preventive care include? A yearly physical exam. This is also called an annual well check. Dental exams once or twice a year. Routine eye exams. Ask your health care provider how often you should have your eyes checked. Personal lifestyle choices, including: Daily care of your teeth and gums. Regular physical activity. Eating a healthy diet. Avoiding tobacco and drug use. Limiting alcohol use. Practicing safe sex. Taking low-dose aspirin every day. Taking vitamin and mineral supplements as recommended by your health care provider. What happens during an annual well check? The services and screenings done by your health care provider during your annual well check will depend on your age, overall health, lifestyle risk factors, and  family history of disease. Counseling  Your health care provider may ask you questions about your: Alcohol use. Tobacco use. Drug use. Emotional well-being. Home and relationship well-being. Sexual activity. Eating habits. History of falls. Memory and ability to understand (cognition). Work and work Statistician. Reproductive health. Screening  You may have the following tests or measurements: Height, weight, and BMI. Blood pressure. Lipid and cholesterol levels. These may be checked every 5 years, or more frequently if you are over 54 years old. Skin check. Lung cancer screening. You may have this screening every year starting at age 24 if you have a 30-pack-year history of smoking and currently smoke or have quit within the past 15 years. Fecal occult blood test (FOBT) of the stool. You may have this test every year starting at age 70. Flexible sigmoidoscopy or colonoscopy. You may have a sigmoidoscopy every 5 years or a colonoscopy every 10 years starting at age 20. Hepatitis C blood test. Hepatitis B blood test. Sexually transmitted disease (STD) testing. Diabetes screening. This is done by checking your blood sugar (glucose) after you have not eaten for a while (fasting). You may have this done every 1-3 years. Bone density scan. This is done to screen for osteoporosis. You may have this done starting at age 42. Mammogram. This may be done every 1-2 years. Talk to your health care provider about how often you should have regular mammograms. Talk with your health care provider about your test results, treatment options, and if necessary, the need for more tests. Vaccines  Your health care provider may recommend certain vaccines, such as: Influenza vaccine. This is recommended every year. Tetanus, diphtheria, and acellular pertussis (Tdap, Td) vaccine. You may need a Td  booster every 10 years. Zoster vaccine. You may need this after age 39. Pneumococcal 13-valent conjugate  (PCV13) vaccine. One dose is recommended after age 62. Pneumococcal polysaccharide (PPSV23) vaccine. One dose is recommended after age 32. Talk to your health care provider about which screenings and vaccines you need and how often you need them. This information is not intended to replace advice given to you by your health care provider. Make sure you discuss any questions you have with your health care provider. Document Released: 12/10/2015 Document Revised: 08/02/2016 Document Reviewed: 09/14/2015 Elsevier Interactive Patient Education  2017 [place] Prevention in the Home Falls can cause injuries. They can happen to people of all ages. There are many things you can do to make your home safe and to help prevent falls. What can I do on the outside of my home? Regularly fix the edges of walkways and driveways and fix any cracks. Remove anything that might make you trip as you walk through a door, such as a raised step or threshold. Trim any bushes or trees on the path to your home. Use bright outdoor lighting. Clear any walking paths of anything that might make someone trip, such as rocks or tools. Regularly check to see if handrails are loose or broken. Make sure that both sides of any steps have handrails. Any raised decks and porches should have guardrails on the edges. Have any leaves, snow, or ice cleared regularly. Use sand or salt on walking paths during winter. Clean up any spills in your garage right away. This includes oil or grease spills. What can I do in the bathroom? Use night lights. Install grab bars by the toilet and in the tub and shower. Do not use towel bars as grab bars. Use non-skid mats or decals in the tub or shower. If you need to sit down in the shower, use a plastic, non-slip stool. Keep the floor dry. Clean up any water that spills on the floor as soon as it happens. Remove soap buildup in the tub or shower regularly. Attach bath mats securely with  double-sided non-slip rug tape. Do not have throw rugs and other things on the floor that can make you trip. What can I do in the bedroom? Use night lights. Make sure that you have a light by your bed that is easy to reach. Do not use any sheets or blankets that are too big for your bed. They should not hang down onto the floor. Have a firm chair that has side arms. You can use this for support while you get dressed. Do not have throw rugs and other things on the floor that can make you trip. What can I do in the kitchen? Clean up any spills right away. Avoid walking on wet floors. Keep items that you use a lot in easy-to-reach places. If you need to reach something above you, use a strong step stool that has a grab bar. Keep electrical cords out of the way. Do not use floor polish or wax that makes floors slippery. If you must use wax, use non-skid floor wax. Do not have throw rugs and other things on the floor that can make you trip. What can I do with my stairs? Do not leave any items on the stairs. Make sure that there are handrails on both sides of the stairs and use them. Fix handrails that are broken or loose. Make sure that handrails are as long as the stairways. Check any carpeting  to make sure that it is firmly attached to the stairs. Fix any carpet that is loose or worn. Avoid having throw rugs at the top or bottom of the stairs. If you do have throw rugs, attach them to the floor with carpet tape. Make sure that you have a light switch at the top of the stairs and the bottom of the stairs. If you do not have them, ask someone to add them for you. What else can I do to help prevent falls? Wear shoes that: Do not have high heels. Have rubber bottoms. Are comfortable and fit you well. Are closed at the toe. Do not wear sandals. If you use a stepladder: Make sure that it is fully opened. Do not climb a closed stepladder. Make sure that both sides of the stepladder are locked  into place. Ask someone to hold it for you, if possible. Clearly mark and make sure that you can see: Any grab bars or handrails. First and last steps. Where the edge of each step is. Use tools that help you move around (mobility aids) if they are needed. These include: Canes. Walkers. Scooters. Crutches. Turn on the lights when you go into a dark area. Replace any light bulbs as soon as they burn out. Set up your furniture so you have a clear path. Avoid moving your furniture around. If any of your floors are uneven, fix them. If there are any pets around you, be aware of where they are. Review your medicines with your doctor. Some medicines can make you feel dizzy. This can increase your chance of falling. Ask your doctor what other things that you can do to help prevent falls. This information is not intended to replace advice given to you by your health care provider. Make sure you discuss any questions you have with your health care provider. Document Released: 09/09/2009 Document Revised: 04/20/2016 Document Reviewed: 12/18/2014 Elsevier Interactive Patient Education  2017 [employer].

## 2022-08-02 NOTE — Progress Notes (Signed)
Subjective:   Shelly Clark is a 76 y.o. female who presents for Medicare Annual (Subsequent) preventive examination.  Review of Systems    Virtual Visit via Telephone Note  I connected with  Shelly Clark on 08/02/22 at 11:30 AM EDT by telephone and verified that I am speaking with the correct person using two identifiers.  Location: Patient: Home Provider: Office Persons participating in the virtual visit: patient/Nurse Health Advisor   I discussed the limitations, risks, security and privacy concerns of performing an evaluation and management service by telephone and the availability of in person appointments. The patient expressed understanding and agreed to proceed.  Interactive audio and video telecommunications were attempted between this nurse and patient, however failed, due to patient having technical difficulties OR patient did not have access to video capability.  We continued and completed visit with audio only.  Some vital signs may be absent or patient reported.   Criselda Peaches, LPN  Cardiac Risk Factors include: advanced age (>32mn, >>38women);diabetes mellitus;hypertension     Objective:    Today's Vitals   08/02/22 1141  Weight: 130 lb (59 kg)  Height: 5' (1.524 m)   Body mass index is 25.39 kg/m.     08/02/2022   11:53 AM 07/09/2022    3:50 PM 06/11/2022   10:28 AM 06/10/2022    4:47 PM 05/04/2021    2:55 PM 11/11/2020    4:53 PM  Advanced Directives  Does Patient Have a Medical Advance Directive? No No No No Yes No  Type of APersonnel officerLiving will   [place] of [place]?     No - copy requested   Would patient like information on creating a medical advance directive? No - Patient declined  Yes (ED - Information included in AVS) No - Patient declined  No - Patient declined    Current Medications (verified) Outpatient Encounter Medications as of 08/02/2022  Medication Sig    albuterol (VENTOLIN HFA) 108 (90 Base) MCG/ACT inhaler Inhale 1-2 puffs into the lungs every 6 (six) hours as needed for wheezing or shortness of breath. (Patient taking differently: Inhale 1-2 puffs into the lungs See admin instructions. Inhale 1-2 puffs into the lungs every four to six hours as needed for shortness of breath or wheezing)   amLODipine (NORVASC) 10 MG tablet Take 1 tablet (10 mg total) by mouth daily.   aspirin EC 81 MG tablet Take 81 mg by mouth daily as needed for mild pain. Swallow whole.   B Complex-C (B-COMPLEX WITH VITAMIN C) tablet Take 1 tablet by mouth daily.   clonazePAM (KLONOPIN) 0.5 MG tablet Take 1 tablet (0.5 mg total) by mouth 2 (two) times daily as needed. for sleep and anxiety   FLUoxetine (PROZAC) 40 MG capsule Take 1 capsule (40 mg total) by mouth daily.   fluticasone-salmeterol (ADVAIR) 100-50 MCG/ACT AEPB Inhale 1 puff into the lungs 2 times daily.   fluticasone-salmeterol (WIXELA INHUB) 100-50 MCG/ACT AEPB Inhale 1 puff into the lungs 2 (two) times daily.   glipiZIDE (GLUCOTROL XL) 5 MG 24 hr tablet TAKE 1 TABLET(5 MG) BY MOUTH DAILY WITH BREAKFAST (Patient taking differently: Take 5 mg by mouth daily with breakfast.)   lisinopril (ZESTRIL) 40 MG tablet Take 1 tablet (40 mg total) by mouth daily.   metFORMIN (GLUCOPHAGE) 500 MG tablet TAKE 1 TABLET BY MOUTH TWICE DAILY WITH A MEAL (Patient taking differently: Take 500 mg  by mouth daily with breakfast.)   Multiple Vitamin (MULTIVITAMIN WITH MINERALS) TABS Take 1 tablet by mouth daily with breakfast.   naproxen (NAPROSYN) 500 MG tablet Take 500 mg by mouth 2 (two) times daily as needed for mild pain.   nicotine (NICODERM CQ) 21 mg/24hr patch Place 1 patch (21 mg total) onto the skin daily.   NON FORMULARY Take 3 tablets by mouth See admin instructions. Seirogan tablets- Take 3 tablets by mouth with water up to three times a day, after meals- as needed for digestive/bowel issues   ondansetron (ZOFRAN) 4 MG tablet  Take 1 tablet (4 mg total) by mouth every 8 (eight) hours as needed for nausea or vomiting.   ondansetron (ZOFRAN) 4 MG tablet Take 1 tablet (4 mg total) by mouth every 8 (eight) hours as needed for nausea or vomiting.   simvastatin (ZOCOR) 20 MG tablet Take 1 tablet (20 mg total) by mouth daily.   spironolactone (ALDACTONE) 25 MG tablet Take 1 tablet (25 mg total) by mouth daily.   TYLENOL 500 MG tablet Take 500-1,000 mg by mouth every 6 (six) hours as needed for mild pain or headache.   No facility-administered encounter medications on file as of 08/02/2022.    Allergies (verified) Phenylpropanolamine, Prednisone, Codeine, Nicoderm [nicotine], Rocephin [ceftriaxone], and Tavist [clemastine]   History: Past Medical History:  Diagnosis Date   Benign hematuria    neg urology workup   Diabetes mellitus without complication ([place])    Esophageal dysmotility    Hyperlipidemia    Hypertension    Migraines    Nicotine dependence, cigarettes, uncomplicated 36/64/4034   Osteoporosis    Tobacco abuse    Past Surgical History:  Procedure Laterality Date   ABDOMINAL HYSTERECTOMY     MOUTH SURGERY     Family History  Problem Relation Age of Onset   Ovarian cancer Mother    Diabetes Mother    Hypertension Mother    Hyperlipidemia Mother    Diabetes Father    Hypertension Father    Hyperlipidemia Father    Hypertension Brother    Hypertension Daughter    Asthma Daughter    Hypertension Daughter    Social History   Socioeconomic History   Marital status: Married    Spouse name: Not on file   Number of children: Not on file   Years of education: Not on file   Highest education level: Not on file  Occupational History   Occupation: OWNER    Employer: [employer]    Comment: Restaurant  Tobacco Use   Smoking status: Every Day    Packs/day: 2.00    Years: 40.00    Total pack years: 80.00    Types: Cigarettes   Smokeless tobacco: Never   Tobacco comments:    1ppd  Vaping Use    Vaping Use: Never used  Substance and Sexual Activity   Alcohol use: No   Drug use: No   Sexual activity: Not on file  Other Topics Concern   Not on file  Social History Narrative   ** Merged History Encounter **       Owns a home  She is married  Two children      Social Determinants of Health   Financial Resource Strain: Low Risk  (08/02/2022)   Overall Financial Resource Strain (CARDIA)    Difficulty of Paying Living Expenses: Not hard at all  Food Insecurity: No Food Insecurity (08/02/2022)   Hunger Vital Sign    Worried About  Running Out of Food in the Last Year: Never true    [place] in the Last Year: Never true  Transportation Needs: No Transportation Needs (08/02/2022)   PRAPARE - Hydrologist (Medical): No    Lack of Transportation (Non-Medical): No  Physical Activity: Inactive (08/02/2022)   Exercise Vital Sign    Days of Exercise per Week: 0 days    Minutes of Exercise per Session: 0 min  Stress: No Stress Concern Present (08/02/2022)   [place]    Feeling of Stress : Not at all  Social Connections: Moderately Isolated (08/02/2022)   Social Connection and Isolation Panel [NHANES]    Frequency of Communication with Friends and Family: More than three times a week    Frequency of Social Gatherings with Friends and Family: More than three times a week    Attends Religious Services: Never    Marine scientist or Organizations: No    Attends Music therapist: Never    Marital Status: Married    Tobacco Counseling Ready to quit: Yes Counseling given: Yes Tobacco comments: 1ppd   Clinical Intake: Nutrition Risk Assessment:  Has the patient had any N/V/D within the last 2 months?  No  Does the patient have any non-healing wounds?  No  Has the patient had any unintentional weight loss or weight gain?  No   Diabetes:  Is the patient diabetic?  Yes   If diabetic, was a CBG obtained today?  No  Did the patient bring in their glucometer from home?  No  How often do you monitor your CBG's? PRN.   Financial Strains and Diabetes Management:   Diabetic Exams:  Diabetic Eye Exam: Completed No. Overdue for diabetic eye exam. Pt has been advised about the importance in completing this exam. A referral has been placed today. Message sent to referral coordinator for scheduling purposes. Advised pt to expect a call from office referred to regarding appt.  Diabetic Foot Exam: Completed No. Pt has been advised about the importance in completing this exam. Pt is scheduled for diabetic foot exam on Followed by PCP.   Pre-visit preparation completed: No Diabetic?  Yes Information entered by :: Rolene Arbour LPN   Activities of Daily Living    08/02/2022   11:49 AM 06/11/2022    6:00 PM  In your present state of health, do you have any difficulty performing the following activities:  Hearing? 0 0  Vision? 0 0  Difficulty concentrating or making decisions? 0 0  Walking or climbing stairs? 0 0  Dressing or bathing? 0 0  Doing errands, shopping? 0 0  Preparing Food and eating ? N   Using the Toilet? N   In the past six months, have you accidently leaked urine? Y   Comment Wears pads. Followed by PCP   Do you have problems with loss of bowel control? N   Managing your Medications? N   Managing your Finances? N   Housekeeping or managing your Housekeeping? N     Patient Care Team: [name], NP as PCP - General (Family Medicine)  Indicate any recent Medical Services you may have received from other than [facility] providers in the past year (date may be approximate).     Assessment:   This is a routine wellness examination for Shelly Clark.  Hearing/Vision screen Hearing Screening - Comments:: Denies hearing difficulties   Vision Screening - Comments::  Wears reading  glasses - up to date with routine eye exams with Patient  deferred  Dietary issues and exercise activities discussed: Current Exercise Habits: The patient does not participate in regular exercise at present, Exercise limited by: None identified   Goals Addressed               This Visit's Progress     Patient stated (pt-stated)        Sell my restaurant and retire       Depression Screen    08/02/2022   11:47 AM 05/09/2022    1:23 PM 05/04/2021    2:58 PM 05/04/2021    2:54 PM 05/04/2021    2:53 PM 04/16/2020   10:56 AM 10/05/2016    4:23 PM  PHQ 2/9 Scores  PHQ - 2 Score 0 2 0 0 0 0 0  PHQ- 9 Score  8         Fall Risk    08/02/2022   11:51 AM 05/09/2022    1:24 PM 05/04/2021    2:57 PM 04/16/2020   10:56 AM 10/05/2016    4:23 PM  [place] in the past year? 0 0 0 0 No  Number falls in past yr: 0 0 0    Injury with Fall? 0 0 0    Risk for fall due to : No Fall Risks No Fall Risks No Fall Risks    Follow up Falls prevention discussed Falls evaluation completed Falls evaluation completed      [place]:  Any stairs in or around the home? No  If so, are there any without handrails? No  Home free of loose throw rugs in walkways, pet beds, electrical cords, etc? Yes  Adequate lighting in your home to reduce risk of falls? Yes   ASSISTIVE DEVICES UTILIZED TO PREVENT FALLS:  Life alert? No  Use of a cane, walker or w/c? No  Grab bars in the bathroom? Yes  Shower chair or bench in shower? No  Elevated toilet seat or a handicapped toilet? Yes   TIMED UP AND GO:  Was the test performed? No . Audio Visit  Cognitive Function:        08/02/2022   11:53 AM  6CIT Screen  What Year? 0 points  What month? 0 points  What time? 0 points  Count back from 20 0 points  Months in reverse 0 points  Repeat phrase 0 points  Total Score 0 points    Immunizations Immunization History  Administered Date(s) Administered   Fluad Quad(high Dose 65+) 11/04/2019, 10/05/2021   Influenza Whole  09/06/2007, 11/25/2009   Influenza, High Dose Seasonal PF 10/05/2016, 10/26/2017   PFIZER(Purple Top)SARS-COV-2 Vaccination 12/31/2019, 01/21/2020   Pneumococcal Conjugate-13 02/24/2015   Pneumococcal Polysaccharide-23 10/26/2017   Td 02/24/2015   Zoster, Live 01/10/2010    TDAP status: Up to date  Flu Vaccine status: Up to date  Pneumococcal vaccine status: Up to date  Covid-19 vaccine status: Completed vaccines  Qualifies for Shingles Vaccine? Yes   Zostavax completed No   Shingrix Completed?: No.    Education has been provided regarding the importance of this vaccine. Patient has been advised to call insurance company to determine out of pocket expense if they have not yet received this vaccine. Advised may also receive vaccine at local pharmacy or Health Dept. Verbalized acceptance and understanding.  Screening Tests Health Maintenance  Topic Date Due   OPHTHALMOLOGY EXAM  Never done   COVID-19 Vaccine (3 - Pfizer series) 08/18/2022 (Originally 03/17/2020)   Zoster Vaccines- Shingrix (1 of 2) 11/01/2022 (Originally 10/02/1998)   INFLUENZA VACCINE  02/25/2023 (Originally 06/27/2022)   MAMMOGRAM  08/03/2023 (Originally 07/28/2022)   DEXA SCAN  08/03/2023 (Originally 10/02/2013)   FOOT EXAM  10/05/2022   HEMOGLOBIN A1C  10/12/2022   TETANUS/TDAP  02/23/2025   COLONOSCOPY (Pts 45-2yr Insurance coverage will need to be confirmed)  09/02/2026   Pneumonia Vaccine 76 Years old  Completed   Hepatitis C Screening  Completed   HPV VACCINES  Aged Out    Health Maintenance  Health Maintenance Due  Topic Date Due   OPHTHALMOLOGY EXAM  Never done    Colorectal cancer screening: Type of screening: Colonoscopy. Completed 09/02/16. Repeat every 10 years  Mammogram status: Ordered Patient deferred. Pt provided with contact info and advised to call to schedule appt.   Bone Density status: Ordered Patient deferred. Pt provided with contact info and advised to call to schedule  appt.  Lung Cancer Screening: (Low Dose CT Chest recommended if Age 74-80years, 30 pack-year currently smoking OR have quit w/in 15years.) does qualify.   Lung Cancer Screening Referral: Patient deferred  Additional Screening:  Hepatitis C Screening: does qualify; Completed 10/29/18  Vision Screening: Recommended annual ophthalmology exams for early detection of glaucoma and other disorders of the eye. Is the patient up to date with their annual eye exam?  No Who is the provider or what is the name of the office in which the patient attends annual eye exams? Patient Deferred If pt is not established with a provider, would they like to be referred to a provider to establish care? No .   Dental Screening: Recommended annual dental exams for proper oral hygiene  Community Resource Referral / Chronic Care Management:  CRR required this visit?  No   CCM required this visit?  No      Plan:     I have personally reviewed and noted the following in the patient's chart:   Medical and social history Use of alcohol, tobacco or illicit drugs  Current medications and supplements including opioid prescriptions. Patient is not currently taking opioid prescriptions. Functional ability and status Nutritional status Physical activity Advanced directives List of other physicians Hospitalizations, surgeries, and ER visits in previous 12 months Vitals Screenings to include cognitive, depression, and falls Referrals and appointments  In addition, I have reviewed and discussed with patient certain preventive protocols, quality metrics, and best practice recommendations. A written personalized care plan for preventive services as well as general preventive health recommendations were provided to patient.     BCriselda Peaches LPN   99/01/8100  Nurse Notes: None

## 2022-08-22 ENCOUNTER — Ambulatory Visit (INDEPENDENT_AMBULATORY_CARE_PROVIDER_SITE_OTHER): Payer: PPO | Admitting: Adult Health

## 2022-08-22 ENCOUNTER — Encounter: Payer: Self-pay | Admitting: Adult Health

## 2022-08-22 VITALS — BP 122/74 | HR 75 | Temp 97.9°F | Wt 128.4 lb

## 2022-08-22 DIAGNOSIS — F419 Anxiety disorder, unspecified: Secondary | ICD-10-CM

## 2022-08-22 NOTE — Progress Notes (Addendum)
Subjective:    Patient ID: Shelly Clark, female    DOB: March 23, 1946, 76 y.o.   MRN: [id number]  HPI 76 year old female who  has a past medical history of Benign hematuria, Diabetes mellitus without complication ([place]), Esophageal dysmotility, Hyperlipidemia, Hypertension, Migraines, Nicotine dependence, cigarettes, uncomplicated (95/18/8416), Osteoporosis, and Tobacco abuse.  She presents to the office today for 1 month follow-up regarding anxiety.  During her last visit we increased her Prozac from 20 mg to 40 mg and she was kept on her Klonopin 0.5 mg twice daily as needed.  She reports that when she increased the Prozac it made her feel fatigued and she did not feel like doing anything, she went back to the 20 mg dose and reports "I feel great, I do not have any anxiety and I am happy".  Review of Systems See HPI   Past Medical History:  Diagnosis Date   Benign hematuria    neg urology workup   Diabetes mellitus without complication ([place])    Esophageal dysmotility    Hyperlipidemia    Hypertension    Migraines    Nicotine dependence, cigarettes, uncomplicated 60/63/0160   Osteoporosis    Tobacco abuse     Social History   Socioeconomic History   Marital status: Married    Spouse name: Not on file   Number of children: Not on file   Years of education: Not on file   Highest education level: Not on file  Occupational History   Occupation: OWNER    Employer: [employer]    Comment: Restaurant  Tobacco Use   Smoking status: Every Day    Packs/day: 2.00    Years: 40.00    Total pack years: 80.00    Types: Cigarettes   Smokeless tobacco: Never   Tobacco comments:    1ppd  Vaping Use   Vaping Use: Never used  Substance and Sexual Activity   Alcohol use: No   Drug use: No   Sexual activity: Not on file  Other Topics Concern   Not on file  Social History Narrative   ** Merged History Encounter **       Owns a home  She is married  Two children      Social  Determinants of Health   Financial Resource Strain: Low Risk  (08/02/2022)   Overall Financial Resource Strain (CARDIA)    Difficulty of Paying Living Expenses: Not hard at all  Food Insecurity: No Food Insecurity (08/02/2022)   Hunger Vital Sign    Worried About Running Out of Food in the Last Year: Never true    Ran Out of Food in the Last Year: Never true  Transportation Needs: No Transportation Needs (08/02/2022)   PRAPARE - Hydrologist (Medical): No    Lack of Transportation (Non-Medical): No  Physical Activity: Inactive (08/02/2022)   Exercise Vital Sign    Days of Exercise per Week: 0 days    Minutes of Exercise per Session: 0 min  Stress: No Stress Concern Present (08/02/2022)   [place]    Feeling of Stress : Not at all  Social Connections: Moderately Isolated (08/02/2022)   Social Connection and Isolation Panel [NHANES]    Frequency of Communication with Friends and Family: More than three times a week    Frequency of Social Gatherings with Friends and Family: More than three times a week    Attends Religious Services: Never  Active Member of Clubs or Organizations: No    Attends Archivist Meetings: Never    Marital Status: Married  Human resources officer Violence: Not At Risk (08/02/2022)   Humiliation, Afraid, Rape, and Kick questionnaire    Fear of Current or Ex-Partner: No    Emotionally Abused: No    Physically Abused: No    Sexually Abused: No    Past Surgical History:  Procedure Laterality Date   ABDOMINAL HYSTERECTOMY     MOUTH SURGERY      Family History  Problem Relation Age of Onset   Ovarian cancer Mother    Diabetes Mother    Hypertension Mother    Hyperlipidemia Mother    Diabetes Father    Hypertension Father    Hyperlipidemia Father    Hypertension Brother    Hypertension Daughter    Asthma Daughter    Hypertension Daughter     Allergies   Allergen Reactions   Phenylpropanolamine Nausea And Vomiting and Other (See Comments)    "Tavist-D"   Prednisone Nausea Only and Other (See Comments)    Can tolerate via IV, but not orally   Codeine Nausea And Vomiting   Nicoderm [Nicotine] Rash and Other (See Comments)    Patient said she is ALLERGIC to the adhesive   Rocephin [Ceftriaxone] Rash and Other (See Comments)    06/11/22 got "redness on the neck and face" after an infusion of Rocephin   Tavist [Clemastine] Nausea And Vomiting    "Tavist-D"    Current Outpatient Medications on File Prior to Visit  Medication Sig Dispense Refill   albuterol (VENTOLIN HFA) 108 (90 Base) MCG/ACT inhaler Inhale 1-2 puffs into the lungs every 6 (six) hours as needed for wheezing or shortness of breath. (Patient taking differently: Inhale 1-2 puffs into the lungs See admin instructions. Inhale 1-2 puffs into the lungs every four to six hours as needed for shortness of breath or wheezing) 108 g 0   amLODipine (NORVASC) 10 MG tablet Take 1 tablet (10 mg total) by mouth daily. 90 tablet 2   aspirin EC 81 MG tablet Take 81 mg by mouth daily as needed for mild pain. Swallow whole.     B Complex-C (B-COMPLEX WITH VITAMIN C) tablet Take 1 tablet by mouth daily.     clonazePAM (KLONOPIN) 0.5 MG tablet Take 1 tablet (0.5 mg total) by mouth 2 (two) times daily as needed. for sleep and anxiety 60 tablet 2   fluticasone-salmeterol (ADVAIR) 100-50 MCG/ACT AEPB Inhale 1 puff into the lungs 2 times daily. 60 each 0   fluticasone-salmeterol (WIXELA INHUB) 100-50 MCG/ACT AEPB Inhale 1 puff into the lungs 2 (two) times daily.     glipiZIDE (GLUCOTROL XL) 5 MG 24 hr tablet TAKE 1 TABLET(5 MG) BY MOUTH DAILY WITH BREAKFAST (Patient taking differently: Take 5 mg by mouth daily with breakfast.) 90 tablet 0   lisinopril (ZESTRIL) 40 MG tablet Take 1 tablet (40 mg total) by mouth daily. 90 tablet 3   metFORMIN (GLUCOPHAGE) 500 MG tablet TAKE 1 TABLET BY MOUTH TWICE DAILY WITH  A MEAL (Patient taking differently: Take 500 mg by mouth daily with breakfast.) 180 tablet 0   Multiple Vitamin (MULTIVITAMIN WITH MINERALS) TABS Take 1 tablet by mouth daily with breakfast.     naproxen (NAPROSYN) 500 MG tablet Take 500 mg by mouth 2 (two) times daily as needed for mild pain.     nicotine (NICODERM CQ) 21 mg/24hr patch Place 1 patch (21 mg total) onto  the skin daily. 28 patch 11   NON FORMULARY Take 3 tablets by mouth See admin instructions. Seirogan tablets- Take 3 tablets by mouth with water up to three times a day, after meals- as needed for digestive/bowel issues     ondansetron (ZOFRAN) 4 MG tablet Take 1 tablet (4 mg total) by mouth every 8 (eight) hours as needed for nausea or vomiting. 20 tablet 1   ondansetron (ZOFRAN) 4 MG tablet Take 1 tablet (4 mg total) by mouth every 8 (eight) hours as needed for nausea or vomiting. 20 tablet 0   simvastatin (ZOCOR) 20 MG tablet Take 1 tablet (20 mg total) by mouth daily. 90 tablet 3   spironolactone (ALDACTONE) 25 MG tablet Take 1 tablet (25 mg total) by mouth daily. 30 tablet 0   TYLENOL 500 MG tablet Take 500-1,000 mg by mouth every 6 (six) hours as needed for mild pain or headache.     FLUoxetine (PROZAC) 40 MG capsule Take 1 capsule (40 mg total) by mouth daily. (Patient not taking: Reported on 08/22/2022) 90 capsule 1   No current facility-administered medications on file prior to visit.    BP 122/74   Pulse 75   Temp 97.9 F (36.6 C) (Oral)   Wt 128 lb 6.4 oz (58.2 kg)   SpO2 95%   BMI 25.08 kg/m       Objective:   Physical Exam Vitals and nursing note reviewed.  Constitutional:      Appearance: Normal appearance.  Cardiovascular:     Rate and Rhythm: Normal rate and regular rhythm.     Pulses: Normal pulses.     Heart sounds: Normal heart sounds.  Pulmonary:     Breath sounds: Normal breath sounds.  Skin:    General: Skin is warm and dry.  Neurological:     General: No focal deficit present.     Mental  Status: She is alert and oriented to person, place, and time.  Psychiatric:        Mood and Affect: Mood normal.        Behavior: Behavior normal.        Thought Content: Thought content normal.        Judgment: Judgment normal.        Assessment & Plan:  1. Anxiety    08/22/2022   10:55 AM  GAD 7 : Generalized Anxiety Score  Nervous, Anxious, on Edge 0  Control/stop worrying 0  Worry too much - different things 0  Trouble relaxing 0  Restless 0  Easily annoyed or irritable 0  Afraid - awful might happen 0  Total GAD 7 Score 0   - Ok to stay on Prozac 20 mg daily and klonopin 0.5 mg BID PRN  - follow up as needed  [name], NP  Time spent with patient today was 31 minutes which consisted of chart review, discussing diagnosis, work up, treatment answering questions and documentation.

## 2022-08-22 NOTE — Addendum Note (Signed)
Addended by: Apolinar Junes on: 08/22/2022 06:05 PM   Modules accepted: Level of Service

## 2022-09-08 ENCOUNTER — Other Ambulatory Visit: Payer: Self-pay | Admitting: Adult Health

## 2022-09-08 DIAGNOSIS — E119 Type 2 diabetes mellitus without complications: Secondary | ICD-10-CM

## 2022-09-08 DIAGNOSIS — I10 Essential (primary) hypertension: Secondary | ICD-10-CM

## 2022-09-12 ENCOUNTER — Other Ambulatory Visit: Payer: Self-pay | Admitting: Adult Health

## 2022-09-13 ENCOUNTER — Encounter: Payer: Self-pay | Admitting: Adult Health

## 2022-09-13 ENCOUNTER — Other Ambulatory Visit: Payer: Self-pay | Admitting: Adult Health

## 2022-09-13 DIAGNOSIS — E119 Type 2 diabetes mellitus without complications: Secondary | ICD-10-CM

## 2022-09-20 ENCOUNTER — Other Ambulatory Visit: Payer: Self-pay | Admitting: Adult Health

## 2022-09-20 DIAGNOSIS — I10 Essential (primary) hypertension: Secondary | ICD-10-CM

## 2022-09-20 NOTE — Telephone Encounter (Signed)
Looks like HCTZ was d/c. pleases advise if ok to fill

## 2022-09-25 ENCOUNTER — Ambulatory Visit: Payer: PPO | Admitting: Internal Medicine

## 2022-09-25 ENCOUNTER — Encounter: Payer: Self-pay | Admitting: Internal Medicine

## 2022-09-25 ENCOUNTER — Ambulatory Visit (INDEPENDENT_AMBULATORY_CARE_PROVIDER_SITE_OTHER): Payer: PPO | Admitting: Internal Medicine

## 2022-09-25 VITALS — BP 124/68 | HR 70 | Temp 97.8°F | Ht 61.0 in | Wt 132.4 lb

## 2022-09-25 DIAGNOSIS — F17210 Nicotine dependence, cigarettes, uncomplicated: Secondary | ICD-10-CM

## 2022-09-25 DIAGNOSIS — R0602 Shortness of breath: Secondary | ICD-10-CM

## 2022-09-25 DIAGNOSIS — J42 Unspecified chronic bronchitis: Secondary | ICD-10-CM

## 2022-09-25 DIAGNOSIS — F17200 Nicotine dependence, unspecified, uncomplicated: Secondary | ICD-10-CM

## 2022-09-25 LAB — PULMONARY FUNCTION TEST
DL/VA % pred: 110 %
DL/VA: 4.64 ml/min/mmHg/L
DLCO cor % pred: 135 %
DLCO cor: 23.44 ml/min/mmHg
DLCO unc % pred: 135 %
DLCO unc: 23.44 ml/min/mmHg
FEF 25-75 Post: 2.49 L/sec
FEF 25-75 Pre: 1.82 L/sec
FEF2575-%Change-Post: 36 %
FEF2575-%Pred-Post: 156 %
FEF2575-%Pred-Pre: 114 %
FEV1-%Change-Post: 7 %
FEV1-%Pred-Post: 87 %
FEV1-%Pred-Pre: 81 %
FEV1-Post: 1.67 L
FEV1-Pre: 1.55 L
FEV1FVC-%Change-Post: -1 %
FEV1FVC-%Pred-Pre: 114 %
FEV6-%Change-Post: 10 %
FEV6-%Pred-Post: 81 %
FEV6-%Pred-Pre: 74 %
FEV6-Post: 1.97 L
FEV6-Pre: 1.79 L
FEV6FVC-%Change-Post: 0 %
FEV6FVC-%Pred-Post: 105 %
FEV6FVC-%Pred-Pre: 104 %
FVC-%Change-Post: 9 %
FVC-%Pred-Post: 77 %
FVC-%Pred-Pre: 71 %
FVC-Post: 1.97 L
FVC-Pre: 1.8 L
Post FEV1/FVC ratio: 85 %
Post FEV6/FVC ratio: 100 %
Pre FEV1/FVC ratio: 86 %
Pre FEV6/FVC Ratio: 99 %
RV % pred: 93 %
RV: 1.97 L
TLC % pred: 96 %
TLC: 4.44 L

## 2022-09-25 NOTE — Patient Instructions (Signed)
Full PFT Performed today 

## 2022-09-25 NOTE — Patient Instructions (Addendum)
Please schedule follow up scheduled with myself in 6 months.  If my schedule is not open yet, we will contact you with a reminder closer to that time. Please call [phone] if you haven't heard from [place] a month before.   Follow up with lung cancer screening. Lung function was normal. I still recommend you quit smoking! Keep taking advair 1 puff daily - if breathing worsens increase to twice daily.  Keep taking albuterol inhaler as needed.     What are the benefits of quitting smoking? Quitting smoking can lower your chances of getting or dying from heart disease, lung disease, kidney failure, infection, or cancer. It can also lower your chances of getting osteoporosis, a condition that makes your bones weak. Plus, quitting smoking can help your skin look younger and reduce the chances that you will have problems with sex.  Quitting smoking will improve your health no matter how old you are, and no matter how long or how much you have smoked.  What should I do if I want to quit smoking? The letters in the word "START" can help you remember the steps to take: S = Set a quit date. T = Tell family, friends, and the people around you that you plan to quit. A = Anticipate or plan ahead for the tough times you'll face while quitting. R = Remove cigarettes and other tobacco products from your home, car, and work. T = Talk to your doctor about getting help to quit.  How can my doctor or nurse help? Your doctor or nurse can give you advice on the best way to quit. He or she can also put you in touch with counselors or other people you can call for support. Plus, your doctor or nurse can give you medicines to: ?Reduce your craving for cigarettes ?Reduce the unpleasant symptoms that happen when you stop smoking (called "withdrawal symptoms"). You can also get help from a free phone line (1-800-QUIT-NOW) or go online to [facility].  What are the symptoms of withdrawal? The symptoms  include: ?Trouble sleeping ?Being irritable, anxious or restless ?Getting frustrated or angry ?Having trouble thinking clearly  Some people who stop smoking become temporarily depressed. Some people need treatment for depression, such as counseling or antidepressant medicines. Depressed people might: ?No longer enjoy or care about doing the things they used to like to do ?Feel sad, down, hopeless, nervous, or cranky most of the day, almost every day ?Lose or gain weight ?Sleep too much or too little ?Feel tired or like they have no energy ?Feel guilty or like they are worth nothing ?Forget things or feel confused ?Move and speak more slowly than usual ?Act restless or have trouble staying still ?Think about death or suicide  If you think you might be depressed, see your doctor or nurse. Only someone trained in mental health can tell for sure if you are depressed. If you ever feel like you might hurt yourself, go straight to the nearest emergency department. Or you can call for an ambulance (in the [place] and [place], [place] 9-1-1) or call your doctor or nurse right away and tell them it is an emergency. You can also reach the [place] National Suicide Prevention Lifeline at 806-384-4293 or http://walker-sanchez.info/.  How do medicines help you stop smoking? Different medicines work in different ways: ?Nicotine replacement therapy eases withdrawal and reduces your body's craving for nicotine, the main drug found in cigarettes. There are different forms of nicotine replacement, including skin patches, lozenges,  gum, nasal sprays, and "puffers" or inhalers. Many can be bought without a prescription, while others might require one. ?Bupropion is a prescription medicine that reduces your desire to smoke. This medicine is sold under the brand names Zyban and Wellbutrin. It is also available in a generic version, which is cheaper than brand name medicines. ?Varenicline (brand names: Chantix, Champix)  is a prescription medicine that reduces withdrawal symptoms and cigarette cravings. If you think you'd like to take varenicline and you have a history of depression, anxiety, or heart disease, discuss this with your doctor or nurse before taking the medicine. Varenicline can also increase the effects of alcohol in some people. It's a good idea to limit drinking while you're taking it, at least until you know how it affects you.  How does counseling work? Counseling can happen during formal office visits or just over the phone. A counselor can help you: ?Figure out what triggers your smoking and what to do instead ?Overcome cravings ?Figure out what went wrong when you tried to quit before  What works best? Studies show that people have the best luck at quitting if they take medicines to help them quit and work with a Social worker. It might also be helpful to combine nicotine replacement with one of the prescription medicines that help people quit. In some cases, it might even make sense to take bupropion and varenicline together.  What about e-cigarettes? Sometimes people wonder if using electronic cigarettes, or "e-cigarettes," might help them quit smoking. Using e-cigarettes is also called "vaping." Doctors do not recommend e-cigarettes in place of medicines and counseling. That's because e-cigarettes still contain nicotine as well as other substances that might be harmful. It's not clear how they can affect a person's health in the long term.  Will I gain weight if I quit? Yes, you might gain a few pounds. But quitting smoking will have a much more positive effect on your health than weighing a few pounds more. Plus, you can help prevent some weight gain by being more active and eating less. Taking the medicine bupropion might help control weight gain.   What else can I do to improve my chances of quitting? You can: ?Start exercising. ?Stay away from smokers and places that you associate with  smoking. If people close to you smoke, ask them to quit with you. ?Keep gum, hard candy, or something to put in your mouth handy. If you get a craving for a cigarette, try one of these instead. ?Don't give up, even if you start smoking again. It takes most people a few tries before they succeed.  What if I am pregnant and I smoke? If you are pregnant, it's really important for the health of your baby that you quit. Ask your doctor what options you have, and what is safest for your baby

## 2022-09-25 NOTE — Progress Notes (Signed)
Full PFT Performed today 

## 2022-09-25 NOTE — Progress Notes (Signed)
Shelly Clark    [id number]    [birth year]  Primary Care Physician:Nafziger, Tommi Rumps, NP Date of Appointment: 09/25/2022 Established Patient Visit  Chief complaint:   Chief Complaint  Patient presents with   Follow-up    PFTs today     HPI: Shelly Clark is a 76 y.o. woman with history of tobacco use disorder.   Interval Updates: Here for follow up. PFTs show normal  Taking advair 1 puff once daily.  Occasional albuterol inhaler use.  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  No interval flares of her asthma.   I have reviewed the patient's family social and past medical history and updated as appropriate.   Past Medical History:  Diagnosis Date   Benign hematuria    neg urology workup   Diabetes mellitus without complication ([place])    Esophageal dysmotility    Hyperlipidemia    Hypertension    Migraines    Nicotine dependence, cigarettes, uncomplicated 11/91/4782   Osteoporosis    Tobacco abuse     Past Surgical History:  Procedure Laterality Date   ABDOMINAL HYSTERECTOMY     MOUTH SURGERY      Family History  Problem Relation Age of Onset   Ovarian cancer Mother    Diabetes Mother    Hypertension Mother    Hyperlipidemia Mother    Diabetes Father    Hypertension Father    Hyperlipidemia Father    Hypertension Brother    Hypertension Daughter    Asthma Daughter    Hypertension Daughter     Social History   Occupational History   Occupation: Information systems manager: [employer]    Comment: Restaurant  Tobacco Use   Smoking status: Every Day    Packs/day: 2.00    Years: 40.00    Total pack years: 80.00    Types: Cigarettes   Smokeless tobacco: Never   Tobacco comments:    1ppd  Vaping Use   Vaping Use: Never used  Substance and Sexual Activity   Alcohol use: No   Drug use: No   Sexual activity: Not on file     Physical Exam: Blood pressure 124/68, pulse 70,  temperature 97.8 F (36.6 C), temperature source Oral, height '5\' 1"'$  (1.549 m), weight 132 lb 6.4 oz (60.1 kg), SpO2 98 %.  Gen:      No acute distress Lungs:    No increased respiratory effort, symmetric chest wall excursion, clear to auscultation bilaterally, no wheezes or crackles CV:         Regular rate and rhythm; no murmurs, rubs, or gallops.  No pedal edema   Data Reviewed: Imaging:   PFTs:     Latest Ref Rng & Units 09/25/2022    9:57 AM  PFT Results  FVC-Pre L 1.80   FVC-Predicted Pre % 71   FVC-Post L 1.97   FVC-Predicted Post % 77   Pre FEV1/FVC % % 86   Post FEV1/FCV % % 85   FEV1-Pre L 1.55   FEV1-Predicted Pre % 81   FEV1-Post L 1.67   DLCO uncorrected ml/min/mmHg 23.44   DLCO UNC% % 135   DLCO corrected ml/min/mmHg 23.44   DLCO COR %Predicted % 135   DLVA Predicted % 110   TLC L 4.44   TLC % Predicted % 96   RV % Predicted % 93    I have personally  reviewed the patient's PFTs and normal pulmonary function  Labs:  Immunization status: Immunization History  Administered Date(s) Administered   Fluad Quad(high Dose 65+) 11/04/2019, 10/05/2021   Influenza Whole 09/06/2007, 11/25/2009   Influenza, High Dose Seasonal PF 10/05/2016, 10/26/2017   PFIZER(Purple Top)SARS-COV-2 Vaccination 12/31/2019, 01/21/2020   Pneumococcal Conjugate-13 02/24/2015   Pneumococcal Polysaccharide-23 10/26/2017   Td 02/24/2015   Zoster, Live 01/10/2010    External Records Personally Reviewed:   Assessment:  COPD, 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 Continue albuterol inhaler as needed.  Follow up with lung cancer screening - they called her to schedule and she deferred due to feeling unwell at the time.   Smoking Cessation Counseling:  1. The patient is an everyday smoker and symptomatic due to the following condition copd 2. The patient is currently pre-contemplative in quitting smoking. 3. I advised patient to quit  smoking. 4. We identified patient specific barriers to change.  5. I personally spent 3 minutes counseling the patient regarding tobacco use disorder. 6. We discussed management of stress and anxiety to help with smoking cessation, when applicable. 7. We discussed nicotine replacement therapy, Wellbutrin, Chantix as possible options. 8. I advised setting a quit date. 9. Follow?up arranged with our office to continue ongoing discussions. 10.Resources given to patient including quit hotline.     Return to Care: Return in about 6 months (around 03/27/2023).   Shelly Llamas, MD Pulmonary and [place]

## 2022-10-11 ENCOUNTER — Other Ambulatory Visit: Payer: Self-pay | Admitting: Adult Health

## 2022-10-12 ENCOUNTER — Encounter: Payer: Self-pay | Admitting: Adult Health

## 2022-10-12 ENCOUNTER — Ambulatory Visit (INDEPENDENT_AMBULATORY_CARE_PROVIDER_SITE_OTHER): Payer: PPO | Admitting: Adult Health

## 2022-10-12 VITALS — BP 122/60 | HR 73 | Temp 98.3°F | Ht 61.0 in | Wt 131.0 lb

## 2022-10-12 DIAGNOSIS — E119 Type 2 diabetes mellitus without complications: Secondary | ICD-10-CM | POA: Diagnosis not present

## 2022-10-12 DIAGNOSIS — J449 Chronic obstructive pulmonary disease, unspecified: Secondary | ICD-10-CM

## 2022-10-12 DIAGNOSIS — R21 Rash and other nonspecific skin eruption: Secondary | ICD-10-CM | POA: Diagnosis not present

## 2022-10-12 DIAGNOSIS — Z23 Encounter for immunization: Secondary | ICD-10-CM | POA: Diagnosis not present

## 2022-10-12 DIAGNOSIS — I10 Essential (primary) hypertension: Secondary | ICD-10-CM

## 2022-10-12 LAB — POCT GLYCOSYLATED HEMOGLOBIN (HGB A1C): Hemoglobin A1C: 6.2 % — AB (ref 4.0–5.6)

## 2022-10-12 MED ORDER — FLUTICASONE-SALMETEROL 100-50 MCG/ACT IN AEPB: 1.0000 | INHALATION_SPRAY | Freq: Two times a day (BID) | RESPIRATORY_TRACT | 6 refills | Status: DC

## 2022-10-12 MED ORDER — TRIAMCINOLONE ACETONIDE 0.1 % EX CREA: 1.0000 | TOPICAL_CREAM | Freq: Two times a day (BID) | CUTANEOUS | 0 refills | Status: DC

## 2022-10-12 MED ORDER — SPIRONOLACTONE 25 MG PO TABS: ORAL_TABLET | ORAL | 1 refills | Status: DC

## 2022-10-12 NOTE — Patient Instructions (Signed)
Health Maintenance Due  Topic Date Due   OPHTHALMOLOGY EXAM  Never done   Lung Cancer Screening  Never done   COVID-19 Vaccine (3 - Pfizer series) 03/17/2020   Diabetic kidney evaluation - Urine ACR  06/17/2022   FOOT EXAM  10/05/2022   HEMOGLOBIN A1C  10/12/2022      Row Labels 08/22/2022   10:55 AM 08/02/2022   11:47 AM 05/09/2022    1:23 PM  Depression screen PHQ 2/9   Section Header. No data exists in this row.     Decreased Interest   0 0 1  Down, Depressed, Hopeless   0 0 1  PHQ - 2 Score   0 0 2  Altered sleeping     1  Tired, decreased energy     1  Change in appetite     1  Feeling bad or failure about yourself      1  Trouble concentrating     1  Moving slowly or fidgety/restless     1  PHQ-9 Score     8

## 2022-10-12 NOTE — Progress Notes (Signed)
Subjective:    Patient ID: Shelly Clark, female    DOB: 02/16/1946, 76 y.o.   MRN: [id number]  HPI 76 year old female who  has a past medical history of Benign hematuria, Diabetes mellitus without complication ([place]), Esophageal dysmotility, Hyperlipidemia, Hypertension, Migraines, Nicotine dependence, cigarettes, uncomplicated (29/47/6546), Osteoporosis, and Tobacco abuse.  She presents to the office today for follow-up regarding hypertension and diabetes type 2  Diabetes mellitus type 2-managed with glipizide 5 mg daily and metformin 500 mg twice daily.  She does not check her blood sugars at home.  She denies any episodes of hypoglycemia.  Last A1C roughly 6 months ago was 6.2.  Hypertension-managed with Norvasc 10 mg daily, lisinopril 40 mg daily, and spirolactone 25 mg daily. She denies dizziness, lightheadedness, blurred vision, chest pain, or shortness of breath  BP Readings from Last 3 Encounters:  10/12/22 122/60  09/25/22 124/68  08/22/22 122/74   She also needs refills of Advair and Kenalog cream    Review of Systems See HPI   Past Medical History:  Diagnosis Date   Benign hematuria    neg urology workup   Diabetes mellitus without complication ([place])    Esophageal dysmotility    Hyperlipidemia    Hypertension    Migraines    Nicotine dependence, cigarettes, uncomplicated 50/35/4656   Osteoporosis    Tobacco abuse     Social History   Socioeconomic History   Marital status: Married    Spouse name: Not on file   Number of children: Not on file   Years of education: Not on file   Highest education level: Not on file  Occupational History   Occupation: OWNER    Employer: [employer]    Comment: Restaurant  Tobacco Use   Smoking status: Every Day    Packs/day: 2.00    Years: 40.00    Total pack years: 80.00    Types: Cigarettes   Smokeless tobacco: Never   Tobacco comments:    1ppd  Vaping Use   Vaping Use: Never used  Substance and Sexual Activity    Alcohol use: No   Drug use: No   Sexual activity: Not on file  Other Topics Concern   Not on file  Social History Narrative   ** Merged History Encounter **       Owns a home  She is married  Two children      Social Determinants of Health   Financial Resource Strain: Low Risk  (08/02/2022)   Overall Financial Resource Strain (CARDIA)    Difficulty of Paying Living Expenses: Not hard at all  Food Insecurity: No Food Insecurity (08/02/2022)   Hunger Vital Sign    Worried About Running Out of Food in the Last Year: Never true    Ran Out of Food in the Last Year: Never true  Transportation Needs: No Transportation Needs (08/02/2022)   PRAPARE - Hydrologist (Medical): No    Lack of Transportation (Non-Medical): No  Physical Activity: Inactive (08/02/2022)   Exercise Vital Sign    Days of Exercise per Week: 0 days    Minutes of Exercise per Session: 0 min  Stress: No Stress Concern Present (08/02/2022)   [place]    Feeling of Stress : Not at all  Social Connections: Moderately Isolated (08/02/2022)   Social Connection and Isolation Panel [NHANES]    Frequency of Communication with Friends and Family: More than  three times a week    Frequency of Social Gatherings with Friends and Family: More than three times a week    Attends Religious Services: Never    Marine scientist or Organizations: No    Attends Archivist Meetings: Never    Marital Status: Married  Human resources officer Violence: Not At Risk (08/02/2022)   Humiliation, Afraid, Rape, and Kick questionnaire    Fear of Current or Ex-Partner: No    Emotionally Abused: No    Physically Abused: No    Sexually Abused: No    Past Surgical History:  Procedure Laterality Date   ABDOMINAL HYSTERECTOMY     MOUTH SURGERY      Family History  Problem Relation Age of Onset   Ovarian cancer Mother    Diabetes Mother     Hypertension Mother    Hyperlipidemia Mother    Diabetes Father    Hypertension Father    Hyperlipidemia Father    Hypertension Brother    Hypertension Daughter    Asthma Daughter    Hypertension Daughter     Allergies  Allergen Reactions   Phenylpropanolamine Nausea And Vomiting and Other (See Comments)    "Tavist-D"   Prednisone Nausea Only and Other (See Comments)    Can tolerate via IV, but not orally   Codeine Nausea And Vomiting   Nicoderm [Nicotine] Rash and Other (See Comments)    Patient said she is ALLERGIC to the adhesive   Rocephin [Ceftriaxone] Rash and Other (See Comments)    06/11/22 got "redness on the neck and face" after an infusion of Rocephin   Tavist [Clemastine] Nausea And Vomiting    "Tavist-D"    Current Outpatient Medications on File Prior to Visit  Medication Sig Dispense Refill   albuterol (VENTOLIN HFA) 108 (90 Base) MCG/ACT inhaler Inhale 1-2 puffs into the lungs every 6 (six) hours as needed for wheezing or shortness of breath. (Patient taking differently: Inhale 1-2 puffs into the lungs See admin instructions. Inhale 1-2 puffs into the lungs every four to six hours as needed for shortness of breath or wheezing) 108 g 0   amLODipine (NORVASC) 10 MG tablet Take 1 tablet (10 mg total) by mouth daily. 90 tablet 2   aspirin EC 81 MG tablet Take 81 mg by mouth daily as needed for mild pain. Swallow whole.     B Complex-C (B-COMPLEX WITH VITAMIN C) tablet Take 1 tablet by mouth daily.     clonazePAM (KLONOPIN) 0.5 MG tablet Take 1 tablet (0.5 mg total) by mouth 2 (two) times daily as needed. for sleep and anxiety 60 tablet 2   FLUoxetine (PROZAC) 40 MG capsule Take 1 capsule (40 mg total) by mouth daily. (Patient taking differently: Take 20 mg by mouth daily.) 90 capsule 1   fluticasone-salmeterol (ADVAIR) 100-50 MCG/ACT AEPB Inhale 1 puff into the lungs 2 times daily. 60 each 0   glipiZIDE (GLUCOTROL XL) 5 MG 24 hr tablet TAKE 1 TABLET(5 MG) BY MOUTH DAILY  WITH BREAKFAST 90 tablet 0   hydrochlorothiazide (HYDRODIURIL) 25 MG tablet TAKE 1 TABLET(25 MG) BY MOUTH DAILY 90 tablet 1   lisinopril (ZESTRIL) 40 MG tablet TAKE 1 TABLET(40 MG) BY MOUTH DAILY 90 tablet 3   metFORMIN (GLUCOPHAGE) 500 MG tablet TAKE 1 TABLET BY MOUTH TWICE DAILY WITH A MEAL 180 tablet 0   Multiple Vitamin (MULTIVITAMIN WITH MINERALS) TABS Take 1 tablet by mouth daily with breakfast.     naproxen (NAPROSYN) 500 MG  tablet Take 500 mg by mouth 2 (two) times daily as needed for mild pain.     simvastatin (ZOCOR) 20 MG tablet Take 1 tablet (20 mg total) by mouth daily. 90 tablet 3   spironolactone (ALDACTONE) 25 MG tablet TAKE 1 TABLET(25 MG) BY MOUTH DAILY 30 tablet 0   TYLENOL 500 MG tablet Take 500-1,000 mg by mouth every 6 (six) hours as needed for mild pain or headache.     No current facility-administered medications on file prior to visit.    BP 122/60   Pulse 73   Temp 98.3 F (36.8 C) (Oral)   Ht '5\' 1"'$  (1.549 m)   Wt 131 lb (59.4 kg)   SpO2 98%   BMI 24.75 kg/m       Objective:   Physical Exam Vitals and nursing note reviewed.  Constitutional:      Appearance: Normal appearance.  Cardiovascular:     Rate and Rhythm: Normal rate and regular rhythm.     Pulses: Normal pulses.     Heart sounds: Normal heart sounds.  Pulmonary:     Effort: Pulmonary effort is normal.     Breath sounds: Normal breath sounds.  Musculoskeletal:        General: Normal range of motion.  Skin:    General: Skin is warm and dry.  Neurological:     General: No focal deficit present.     Mental Status: She is alert and oriented to person, place, and time.  Psychiatric:        Mood and Affect: Mood normal.        Behavior: Behavior normal.        Thought Content: Thought content normal.        Judgment: Judgment normal.       Assessment & Plan:  1. Controlled type 2 diabetes mellitus without complication, without long-term current use of insulin (HCC)  - POC HgB A1c- 6.2  - controlled.  - No change in medications  - Follow up in 6 months for CPE  2. Need for immunization against influenza  - Flu Vaccine QUAD High Dose(Fluad)  3. Chronic obstructive pulmonary disease, unspecified COPD type (HCC)  - fluticasone-salmeterol (ADVAIR) 100-50 MCG/ACT AEPB; Inhale 1 puff into the lungs 2 times daily.  Dispense: 60 each; Refill: 6  4. Rash  - triamcinolone cream (KENALOG) 0.1 %; Apply 1 Application topically 2 (two) times daily.  Dispense: 30 g; Refill: 0  5. Essential hypertension - controlled. No change in medications  - spironolactone (ALDACTONE) 25 MG tablet; TAKE 1 TABLET(25 MG) BY MOUTH DAILY  Dispense: 90 tablet; Refill: 1  [name], NP

## 2022-11-30 ENCOUNTER — Ambulatory Visit: Admission: EM | Admit: 2022-11-30 | Discharge: 2022-11-30 | Discharge status: Against Medical Advice | Payer: PPO

## 2022-12-09 ENCOUNTER — Ambulatory Visit
Admission: EM | Admit: 2022-12-09 | Discharge: 2022-12-09 | Disposition: A | Discharge status: Home / Self Care | Payer: PPO | Attending: Emergency Medicine | Admitting: Emergency Medicine

## 2022-12-09 ENCOUNTER — Encounter: Payer: Self-pay | Admitting: Emergency Medicine

## 2022-12-09 DIAGNOSIS — R509 Fever, unspecified: Secondary | ICD-10-CM | POA: Diagnosis not present

## 2022-12-09 DIAGNOSIS — Z794 Long term (current) use of insulin: Secondary | ICD-10-CM | POA: Diagnosis not present

## 2022-12-09 DIAGNOSIS — B9789 Other viral agents as the cause of diseases classified elsewhere: Secondary | ICD-10-CM | POA: Diagnosis not present

## 2022-12-09 DIAGNOSIS — E119 Type 2 diabetes mellitus without complications: Secondary | ICD-10-CM | POA: Insufficient documentation

## 2022-12-09 DIAGNOSIS — J988 Other specified respiratory disorders: Secondary | ICD-10-CM | POA: Diagnosis not present

## 2022-12-09 DIAGNOSIS — Z79899 Other long term (current) drug therapy: Secondary | ICD-10-CM | POA: Insufficient documentation

## 2022-12-09 DIAGNOSIS — F17200 Nicotine dependence, unspecified, uncomplicated: Secondary | ICD-10-CM | POA: Insufficient documentation

## 2022-12-09 DIAGNOSIS — J42 Unspecified chronic bronchitis: Secondary | ICD-10-CM | POA: Insufficient documentation

## 2022-12-09 DIAGNOSIS — Z1152 Encounter for screening for COVID-19: Secondary | ICD-10-CM | POA: Diagnosis not present

## 2022-12-09 MED ORDER — MONTELUKAST SODIUM 10 MG PO TABS: 10.0000 mg | ORAL_TABLET | Freq: Every day | ORAL | 2 refills | Status: DC

## 2022-12-09 MED ORDER — FEXOFENADINE HCL 180 MG PO TABS: 180.0000 mg | ORAL_TABLET | Freq: Every day | ORAL | 1 refills | Status: AC

## 2022-12-09 MED ORDER — PROMETHAZINE-DM 6.25-15 MG/5ML PO SYRP: 5.0000 mL | ORAL_SOLUTION | Freq: Every evening | ORAL | 0 refills | Status: DC | PRN

## 2022-12-09 MED ORDER — GUAIFENESIN 400 MG PO TABS: ORAL_TABLET | ORAL | 0 refills | Status: DC

## 2022-12-09 MED ORDER — MOMETASONE FUROATE 50 MCG/ACT NA SUSP: 2.0000 | Freq: Every day | NASAL | 2 refills | Status: DC

## 2022-12-09 NOTE — ED Triage Notes (Signed)
Patient c/o productive cough, congestion, no energy, low grade fever, nasal drainage x 2 weeks.  Patient has taken Nyquil and Dayquil.

## 2022-12-09 NOTE — ED Provider Notes (Signed)
[facility]    CSN: [id number] Arrival date & time: 12/09/22  0913    HISTORY   Chief Complaint  Patient presents with   Cough   HPI Shelly Clark is a pleasant, 77 y.o. female who presents to urgent care today. Patient complains of productive cough, nasal congestion, runny nose, low energy, low-grade fever for the past 3 weeks, patient states her symptoms have not gotten any better or any worse over the past 3 weeks.  Patient states she has been taking DayQuil and NyQuil without relief.  Patient is a current everyday smoker with a history of chronic bronchitis currently being followed by pulmonology.  Patient has been prescribed Advair, albuterol for her chronic bronchitis.  Patient is also type II diabetic on insulin.  Patient states she is vaccinated for influenza and has received 3 COVID-19 vaccines.  Patient states she has never had COVID-19 in the past.  The history is provided by the patient.   Past Medical History:  Diagnosis Date   Benign hematuria    neg urology workup   Diabetes mellitus without complication ([place])    Esophageal dysmotility    Hyperlipidemia    Hypertension    Migraines    Nicotine dependence, cigarettes, uncomplicated 14/97/0263   Osteoporosis    Tobacco abuse    Patient Active Problem List   Diagnosis Date Noted   Hyponatremia 06/11/2022   Asymptomatic bacteriuria 06/11/2022   COPD with acute exacerbation ([place]) 06/11/2022   E coli bacteremia 11/13/2020   Hypokalemia    Wheezing on expiration    Dehydration with hyponatremia 11/12/2020   Mixed hyperlipidemia due to type 2 diabetes mellitus ([place]) 11/12/2020   GERD without esophagitis 11/12/2020   Nicotine dependence, cigarettes, uncomplicated 78/58/8502   Hypokalemia, inadequate intake 11/12/2020   E. coli UTI 11/12/2020   Sepsis due to gram-negative UTI ([place]) 11/11/2020   Dysphagia 02/24/2015   Adjustment disorder with mixed anxiety and depressed mood 02/10/2013    Hyperlipidemia 10/30/2007   TOBACCO ABUSE 10/30/2007   Essential hypertension 10/30/2007   UNSPECIFIED OSTEOPOROSIS 10/30/2007   Type 2 diabetes mellitus, controlled ([place]) 10/30/2007   Past Surgical History:  Procedure Laterality Date   ABDOMINAL HYSTERECTOMY     MOUTH SURGERY     OB History   No obstetric history on file.    Home Medications    Prior to Admission medications   Medication Sig Start Date End Date Taking? Authorizing Provider  albuterol (VENTOLIN HFA) 108 (90 Base) MCG/ACT inhaler Inhale 1-2 puffs into the lungs every 6 (six) hours as needed for wheezing or shortness of breath. Patient taking differently: Inhale 1-2 puffs into the lungs See admin instructions. Inhale 1-2 puffs into the lungs every four to six hours as needed for shortness of breath or wheezing 06/10/22  Yes Maudie Flakes, MD  amLODipine (NORVASC) 10 MG tablet Take 1 tablet (10 mg total) by mouth daily. 07/26/22  Yes Nafziger, Tommi Rumps, NP  aspirin EC 81 MG tablet Take 81 mg by mouth daily as needed for mild pain. Swallow whole.   Yes [provider]  B Complex-C (B-COMPLEX WITH VITAMIN C) tablet Take 1 tablet by mouth daily.   Yes [provider]  clonazePAM (KLONOPIN) 0.5 MG tablet Take 1 tablet (0.5 mg total) by mouth 2 (two) times daily as needed. for sleep and anxiety 07/18/22  Yes Nafziger, Tommi Rumps, NP  FLUoxetine (PROZAC) 40 MG capsule Take 1 capsule (40 mg total) by mouth daily. Patient taking differently: Take  20 mg by mouth daily. 07/18/22  Yes Nafziger, Tommi Rumps, NP  fluticasone-salmeterol (ADVAIR) 100-50 MCG/ACT AEPB Inhale 1 puff into the lungs 2 times daily. 10/12/22  Yes Nafziger, Tommi Rumps, NP  glipiZIDE (GLUCOTROL XL) 5 MG 24 hr tablet TAKE 1 TABLET(5 MG) BY MOUTH DAILY WITH BREAKFAST 09/12/22  Yes Nafziger, Tommi Rumps, NP  hydrochlorothiazide (HYDRODIURIL) 25 MG tablet TAKE 1 TABLET(25 MG) BY MOUTH DAILY 09/20/22  Yes Nafziger, Tommi Rumps, NP  lisinopril (ZESTRIL) 40 MG tablet TAKE 1 TABLET(40 MG) BY  MOUTH DAILY 09/12/22  Yes Nafziger, Tommi Rumps, NP  metFORMIN (GLUCOPHAGE) 500 MG tablet TAKE 1 TABLET BY MOUTH TWICE DAILY WITH A MEAL 09/20/22  Yes Nafziger, Tommi Rumps, NP  Multiple Vitamin (MULTIVITAMIN WITH MINERALS) TABS Take 1 tablet by mouth daily with breakfast.   Yes [provider]  naproxen (NAPROSYN) 500 MG tablet Take 500 mg by mouth 2 (two) times daily as needed for mild pain.   Yes [provider]  simvastatin (ZOCOR) 20 MG tablet Take 1 tablet (20 mg total) by mouth daily. 07/11/22  Yes Nafziger, Tommi Rumps, NP  spironolactone (ALDACTONE) 25 MG tablet TAKE 1 TABLET(25 MG) BY MOUTH DAILY 10/12/22  Yes Nafziger, Tommi Rumps, NP  triamcinolone cream (KENALOG) 0.1 % Apply 1 Application topically 2 (two) times daily. 10/12/22  Yes Nafziger, Tommi Rumps, NP  TYLENOL 500 MG tablet Take 500-1,000 mg by mouth every 6 (six) hours as needed for mild pain or headache.   Yes [provider]    Family History Family History  Problem Relation Age of Onset   Ovarian cancer Mother    Diabetes Mother    Hypertension Mother    Hyperlipidemia Mother    Diabetes Father    Hypertension Father    Hyperlipidemia Father    Hypertension Brother    Hypertension Daughter    Asthma Daughter    Hypertension Daughter    Social History Social History   Tobacco Use   Smoking status: Every Day    Packs/day: 2.00    Years: 40.00    Total pack years: 80.00    Types: Cigarettes   Smokeless tobacco: Never   Tobacco comments:    1ppd  Vaping Use   Vaping Use: Never used  Substance Use Topics   Alcohol use: No   Drug use: No   Allergies   Phenylpropanolamine, Prednisone, Codeine, Nicoderm [nicotine], Rocephin [ceftriaxone], and Tavist [clemastine]  Review of Systems Review of Systems Pertinent findings revealed after performing a 14 point review of systems has been noted in the history of present illness.  Physical Exam Triage Vital Signs ED Triage Vitals  Enc Vitals Group     BP 09/23/21  0827 (!) 147/82     Pulse Rate 09/23/21 0827 72     Resp 09/23/21 0827 18     Temp 09/23/21 0827 98.3 F (36.8 C)     Temp Source 09/23/21 0827 Oral     SpO2 09/23/21 0827 98 %     Weight --      Height --      Head Circumference --      Peak Flow --      Pain Score 09/23/21 0826 5     Pain Loc --      Pain Edu? --      Excl. in [place]? --   No data found.  Updated Vital Signs BP (!) 144/71 (BP Location: Left Arm)   Pulse 80   Temp (!) 97.5 F (36.4 C) (Oral)   Resp  18   Ht 5' (1.524 m)   Wt 130 lb (59 kg)   SpO2 94%   BMI 25.39 kg/m   Physical Exam Vitals and nursing note reviewed.  Constitutional:      General: She is not in acute distress.    Appearance: Normal appearance. She is not ill-appearing.  HENT:     Head: Normocephalic and atraumatic.     Salivary Glands: Right salivary gland is not diffusely enlarged or tender. Left salivary gland is not diffusely enlarged or tender.     Right Ear: Ear canal and external ear normal. No drainage. A middle ear effusion is present. There is no impacted cerumen. Tympanic membrane is bulging. Tympanic membrane is not injected or erythematous.     Left Ear: Ear canal and external ear normal. No drainage. A middle ear effusion is present. There is no impacted cerumen. Tympanic membrane is bulging. Tympanic membrane is not injected or erythematous.     Ears:     Comments: Bilateral EACs normal, both TMs bulging with clear fluid    Nose: Rhinorrhea present. No nasal deformity, septal deviation, signs of injury, nasal tenderness, mucosal edema or congestion. Rhinorrhea is clear.     Right Nostril: Occlusion present. No foreign body, epistaxis or septal hematoma.     Left Nostril: Occlusion present. No foreign body, epistaxis or septal hematoma.     Right Turbinates: Enlarged, swollen and pale.     Left Turbinates: Enlarged, swollen and pale.     Right Sinus: No maxillary sinus tenderness or frontal sinus tenderness.     Left Sinus: No  maxillary sinus tenderness or frontal sinus tenderness.     Mouth/Throat:     Lips: Pink. No lesions.     Mouth: Mucous membranes are moist. No oral lesions.     Pharynx: Oropharynx is clear. Uvula midline. No posterior oropharyngeal erythema or uvula swelling.     Tonsils: No tonsillar exudate. 0 on the right. 0 on the left.     Comments: Postnasal drip Eyes:     General: Lids are normal.        Right eye: No discharge.        Left eye: No discharge.     Extraocular Movements: Extraocular movements intact.     Conjunctiva/sclera: Conjunctivae normal.     Right eye: Right conjunctiva is not injected.     Left eye: Left conjunctiva is not injected.  Neck:     Trachea: Trachea and phonation normal.  Cardiovascular:     Rate and Rhythm: Normal rate and regular rhythm.     Pulses: Normal pulses.     Heart sounds: Normal heart sounds. No murmur heard.    No friction rub. No gallop.  Pulmonary:     Effort: Pulmonary effort is normal. No accessory muscle usage, prolonged expiration or respiratory distress.     Breath sounds: Normal breath sounds. No stridor, decreased air movement or transmitted upper airway sounds. No decreased breath sounds, wheezing, rhonchi or rales.  Chest:     Chest wall: No tenderness.  Musculoskeletal:        General: Normal range of motion.     Cervical back: Normal range of motion and neck supple. Normal range of motion.  Lymphadenopathy:     Cervical: No cervical adenopathy.  Skin:    General: Skin is warm and dry.     Findings: No erythema or rash.  Neurological:     General: No focal deficit present.  Mental Status: She is alert and oriented to person, place, and time.  Psychiatric:        Mood and Affect: Mood normal.        Behavior: Behavior normal.     Visual Acuity Right Eye Distance:   Left Eye Distance:   Bilateral Distance:    Right Eye Near:   Left Eye Near:    Bilateral Near:     UC Couse / Diagnostics / Procedures:      Radiology No results found.  Procedures Procedures (including critical care time) EKG  Pending results:  Labs Reviewed - No data to display  Medications Ordered in UC: Medications - No data to display  UC Diagnoses / Final Clinical Impressions(s)   I have reviewed the triage vital signs and the nursing notes.  Pertinent labs & imaging results that were available during my care of the patient were reviewed by me and considered in my medical decision making (see chart for details).    Final diagnoses:  Viral respiratory infection   Patient provided with allergy medications for upper respiratory exam findings concerning for uncontrolled allergies.  Patient advised to continue albuterol and Advair as prescribed.  Patient provided with guaifenesin to promote expectoration and Promethazine DM for nighttime so that she can get some sleep.  Patient encouraged to use albuterol multiple times throughout the day whether she feels like she needs it or not.  Patient advised to follow-up with her pulmonologist and or PCP if not feeling any better in the next 3 to 5 days.  Please see discharge instructions below for further details of plan of care as provided to patient. ED Prescriptions     Medication Sig Dispense Auth. Provider   fexofenadine (ALLEGRA) 180 MG tablet Take 1 tablet (180 mg total) by mouth daily. 90 tablet Lynden Oxford Scales, PA-C   mometasone (NASONEX) 50 MCG/ACT nasal spray Place 2 sprays into the nose daily. 1 each Lynden Oxford Scales, PA-C   montelukast (SINGULAIR) 10 MG tablet Take 1 tablet (10 mg total) by mouth at bedtime. 30 tablet Lynden Oxford Scales, PA-C   guaifenesin (HUMIBID E) 400 MG TABS tablet Take 1 tablet 3 times daily as needed for chest congestion and cough 21 tablet Lynden Oxford Scales, PA-C   promethazine-dextromethorphan (PROMETHAZINE-DM) 6.25-15 MG/5ML syrup Take 5 mLs by mouth at bedtime as needed for cough. 60 mL Lynden Oxford Scales, PA-C       PDMP not reviewed this encounter.  Disposition Upon Discharge:  Condition: stable for discharge home Home: take medications as prescribed; routine discharge instructions as discussed; follow up as advised.  Patient presented with an acute illness with associated systemic symptoms and significant discomfort requiring urgent management. In my opinion, this is a condition that a prudent lay person (someone who possesses an average knowledge of health and medicine) may potentially expect to result in complications if not addressed urgently such as respiratory distress, impairment of bodily function or dysfunction of bodily organs.   Routine symptom specific, illness specific and/or disease specific instructions were discussed with the patient and/or caregiver at length.   As such, the patient has been evaluated and assessed, work-up was performed and treatment was provided in alignment with urgent care protocols and evidence based medicine.  Patient/parent/caregiver has been advised that the patient may require follow up for further testing and treatment if the symptoms continue in spite of treatment, as clinically indicated and appropriate.  If the patient was tested for COVID-19, Influenza and/or RSV,  then the patient/parent/guardian was advised to isolate at home pending the results of his/her diagnostic coronavirus test and potentially longer if they're positive. I have also advised pt that if his/her COVID-19 test returns positive, it's recommended to self-isolate for at least 10 days after symptoms first appeared AND until fever-free for 24 hours without fever reducer AND other symptoms have improved or resolved. Discussed self-isolation recommendations as well as instructions for household member/close contacts as per the [facility] and [place] DHHS, and also gave patient the [place] packet with this information.  Patient/parent/caregiver has been advised to return to the [facility] or PCP in 3-5 days if no  better; to PCP or the Emergency Department if new signs and symptoms develop, or if the current signs or symptoms continue to change or worsen for further workup, evaluation and treatment as clinically indicated and appropriate  The patient will follow up with their current PCP if and as advised. If the patient does not currently have a PCP we will assist them in obtaining one.   The patient may need specialty follow up if the symptoms continue, in spite of conservative treatment and management, for further workup, evaluation, consultation and treatment as clinically indicated and appropriate.  Patient/parent/caregiver verbalized understanding and agreement of plan as discussed.  All questions were addressed during visit.  Please see discharge instructions below for further details of plan.  Discharge Instructions:   Discharge Instructions      I strongly recommend that you have a COVID-19 and influenza test.  I am sorry that we do not have influenza test here at this location today and that her COVID-19 test takes a day and a half to be completed.  I am sure your local [facility] pharmacy will be able to perform this test for you.     If your COVID-19 PCR test is negative, please consider retesting in the next 2 to 3 days, particularly if you are not feeling any better.      If both of your COVID-19 tests are negative and your influenza test is also negative, then you can safely assume that your illness is due to one of the many less serious illnesses circulating in our community right now.  Conservative care is recommended with rest, drinking plenty of clear fluids, eating only when hungry, taking supportive medications for your symptoms and avoiding being around other people.  Please remain at home until you are fever free for 24 hours without the use of antifever medications such as Tylenol and ibuprofen.     Based on my physical exam findings and the history you have provided  today, I do not  recommend antibiotics at this time.  I do not believe the risks and side effects of antibiotics would outweigh any minimal benefit that they might provide.       Please read below to learn more about the medications, dosages and frequencies that I recommend to help alleviate your symptoms and to get you feeling better soon:   Allegra (fexofenadine): This is an excellent second-generation antihistamine that helps to reduce respiratory inflammatory response to environmental allergens.  This medication is not known to cause daytime sleepiness so it can be taken in the daytime.  If you find that it does make you sleepy, please feel free to take it at bedtime.  I have sent a prescription to your pharmacy.   Singulair (montelukast): This is a mast cell stabilizer that works well with antihistamines.  Mast cells are responsible for stimulating histamine  production so you can imagine that if we can reduce the activity of your mast cells, then fewer histamines will be produced and inflammation caused by allergy exposure will be significantly reduced.  I recommend that you take this medication at the same time you take your antihistamine.  I have sent a prescription to your pharmacy.   Nasonex (mometasone): This is a steroid nasal spray that you use once daily, 2 sprays in each nare.  This medication does not work well if you decide to use it only used as you feel you need to, it works best used on a daily basis.  After 3 to 5 days of use, you will notice significant reduction of the inflammation and mucus production that is currently being caused by exposure to allergens, whether seasonal or environmental.  The most common side effect of this medication is nosebleeds.  If you experience a nosebleed, please discontinue use for 1 week, then feel free to resume.  I have sent a prescription to your pharmacy.     ProAir, Ventolin, Proventil (albuterol): This inhaled medication contains a short acting beta agonist  bronchodilator.  This medication works on the smooth muscle that opens and constricts of your airways by relaxing the muscle.  The result of relaxation of the smooth muscle is increased air movement and improved work of breathing.  This is a short acting medication that can be used every 4-6 hours as needed for increased work of breathing, shortness of breath, wheezing and excessive coughing.  For the next 3 to 5 days, please inhale 2 puffs 4 times daily with you for like you need to or not.  This will help open your airways and improve your work of breathing.    Advair (fluticasone and salmeterol): Please continue to inhale 2 puffs twice daily.  This inhaled medication contains a corticosteroid and long-acting form of albuterol.  The inhaled steroid and this medication  is not absorbed into the body and will not cause side effects such as increased blood sugar levels, irritability, sleeplessness or weight gain.  Inhaled corticosteroid are sort of like topical steroid creams but, as you can imagine, it is not practical to attempt to rub a steroid cream inside of your lungs.  The long-acting albuterol works similarly to the short acting albuterol found in your rescue inhaler but provides 24-hour relaxation of the smooth muscles that open and constrict your airways; your short acting rescue inhaler can only provide for a few hours this benefit for a few hours.  Please feel free to continue using your short acting rescue inhaler as often as needed throughout the day for shortness of breath, wheezing, and cough.    Robitussin, Mucinex (guaifenesin): This is an expectorant.  This helps break up chest congestion and loosen up thick nasal drainage making phlegm and drainage more liquid and therefore easier to remove.  I recommend being 400 mg three times during the day as needed.   I have sent a prescription to your pharmacy.   Promethazine DM: Promethazine is both a nasal decongestant and an antinausea medication  that makes most patients feel fairly sleepy.  The DM is dextromethorphan, a cough suppressant found in many over-the-counter cough medications.  Please take 5 mL before bedtime to minimize your cough which will help you sleep better.  I have sent a prescription to your pharmacy.   Please follow-up within the next 5-7 days either with your primary care provider or urgent care if your symptoms do not  resolve.  If you do not have a primary care provider, we will assist you in finding one.        Thank you for visiting urgent care today.  We appreciate the opportunity to participate in your care.       This office note has been dictated using Museum/gallery curator.  Unfortunately, this method of dictation can sometimes lead to typographical or grammatical errors.  I apologize for your inconvenience in advance if this occurs.  Please do not hesitate to reach out to me if clarification is needed.      Lynden Oxford Scales, PA-C 12/09/22 1012

## 2022-12-09 NOTE — Discharge Instructions (Addendum)
I strongly recommend that you have a COVID-19 and influenza test.  I am sorry that we do not have influenza test here at this location today and that her COVID-19 test takes a day and a half to be completed.  I am sure your local [facility] pharmacy will be able to perform this test for you.     If your COVID-19 PCR test is negative, please consider retesting in the next 2 to 3 days, particularly if you are not feeling any better.      If both of your COVID-19 tests are negative and your influenza test is also negative, then you can safely assume that your illness is due to one of the many less serious illnesses circulating in our community right now.  Conservative care is recommended with rest, drinking plenty of clear fluids, eating only when hungry, taking supportive medications for your symptoms and avoiding being around other people.  Please remain at home until you are fever free for 24 hours without the use of antifever medications such as Tylenol and ibuprofen.     Based on my physical exam findings and the history you have provided  today, I do not recommend antibiotics at this time.  I do not believe the risks and side effects of antibiotics would outweigh any minimal benefit that they might provide.       Please read below to learn more about the medications, dosages and frequencies that I recommend to help alleviate your symptoms and to get you feeling better soon:   Allegra (fexofenadine): This is an excellent second-generation antihistamine that helps to reduce respiratory inflammatory response to environmental allergens.  This medication is not known to cause daytime sleepiness so it can be taken in the daytime.  If you find that it does make you sleepy, please feel free to take it at bedtime.  I have sent a prescription to your pharmacy.   Singulair (montelukast): This is a mast cell stabilizer that works well with antihistamines.  Mast cells are responsible for stimulating histamine  production so you can imagine that if we can reduce the activity of your mast cells, then fewer histamines will be produced and inflammation caused by allergy exposure will be significantly reduced.  I recommend that you take this medication at the same time you take your antihistamine.  I have sent a prescription to your pharmacy.   Nasonex (mometasone): This is a steroid nasal spray that you use once daily, 2 sprays in each nare.  This medication does not work well if you decide to use it only used as you feel you need to, it works best used on a daily basis.  After 3 to 5 days of use, you will notice significant reduction of the inflammation and mucus production that is currently being caused by exposure to allergens, whether seasonal or environmental.  The most common side effect of this medication is nosebleeds.  If you experience a nosebleed, please discontinue use for 1 week, then feel free to resume.  I have sent a prescription to your pharmacy.     ProAir, Ventolin, Proventil (albuterol): This inhaled medication contains a short acting beta agonist bronchodilator.  This medication works on the smooth muscle that opens and constricts of your airways by relaxing the muscle.  The result of relaxation of the smooth muscle is increased air movement and improved work of breathing.  This is a short acting medication that can be used every 4-6 hours as needed for increased  work of breathing, shortness of breath, wheezing and excessive coughing.  For the next 3 to 5 days, please inhale 2 puffs 4 times daily with you for like you need to or not.  This will help open your airways and improve your work of breathing.    Advair (fluticasone and salmeterol): Please continue to inhale 2 puffs twice daily.  This inhaled medication contains a corticosteroid and long-acting form of albuterol.  The inhaled steroid and this medication  is not absorbed into the body and will not cause side effects such as increased blood  sugar levels, irritability, sleeplessness or weight gain.  Inhaled corticosteroid are sort of like topical steroid creams but, as you can imagine, it is not practical to attempt to rub a steroid cream inside of your lungs.  The long-acting albuterol works similarly to the short acting albuterol found in your rescue inhaler but provides 24-hour relaxation of the smooth muscles that open and constrict your airways; your short acting rescue inhaler can only provide for a few hours this benefit for a few hours.  Please feel free to continue using your short acting rescue inhaler as often as needed throughout the day for shortness of breath, wheezing, and cough.    Robitussin, Mucinex (guaifenesin): This is an expectorant.  This helps break up chest congestion and loosen up thick nasal drainage making phlegm and drainage more liquid and therefore easier to remove.  I recommend being 400 mg three times during the day as needed.   I have sent a prescription to your pharmacy.   Promethazine DM: Promethazine is both a nasal decongestant and an antinausea medication that makes most patients feel fairly sleepy.  The DM is dextromethorphan, a cough suppressant found in many over-the-counter cough medications.  Please take 5 mL before bedtime to minimize your cough which will help you sleep better.  I have sent a prescription to your pharmacy.   Please follow-up within the next 5-7 days either with your primary care provider or urgent care if your symptoms do not resolve.  If you do not have a primary care provider, we will assist you in finding one.        Thank you for visiting urgent care today.  We appreciate the opportunity to participate in your care.

## 2022-12-10 LAB — SARS CORONAVIRUS 2 (TAT 6-24 HRS): SARS Coronavirus 2: NEGATIVE

## 2022-12-12 ENCOUNTER — Ambulatory Visit (INDEPENDENT_AMBULATORY_CARE_PROVIDER_SITE_OTHER): Payer: PPO | Admitting: Adult Health

## 2022-12-12 ENCOUNTER — Encounter: Payer: Self-pay | Admitting: Adult Health

## 2022-12-12 VITALS — BP 138/68 | HR 88 | Temp 97.8°F | Ht 60.0 in | Wt 130.0 lb

## 2022-12-12 DIAGNOSIS — F4323 Adjustment disorder with mixed anxiety and depressed mood: Secondary | ICD-10-CM | POA: Diagnosis not present

## 2022-12-12 DIAGNOSIS — R1310 Dysphagia, unspecified: Secondary | ICD-10-CM

## 2022-12-12 DIAGNOSIS — J988 Other specified respiratory disorders: Secondary | ICD-10-CM

## 2022-12-12 MED ORDER — DOXYCYCLINE HYCLATE 100 MG PO CAPS: 100.0000 mg | ORAL_CAPSULE | Freq: Two times a day (BID) | ORAL | 0 refills | Status: DC

## 2022-12-12 MED ORDER — PREDNISONE 10 MG PO TABS: ORAL_TABLET | ORAL | 0 refills | Status: DC

## 2022-12-12 NOTE — Progress Notes (Signed)
Subjective:    Patient ID: Shelly Clark, female    DOB: [birth year], 77 y.o.   MRN: [id number]  HPI 77 year old female who  has a past medical history of Benign hematuria, Diabetes mellitus without complication ([place]), Esophageal dysmotility, Hyperlipidemia, Hypertension, Migraines, Nicotine dependence, cigarettes, uncomplicated (29/52/8413), Osteoporosis, and Tobacco abuse.  She presents to the office today for multiple issues.  She reports that over the last two weeks she has been experiencing a productive cough with chest congestion with shortness of breath and wheezing. Has not had any recent fevers or chills. She continues to smoke. Using her inhalers for COPD and this helps.   Additionally her daughter reports that Lavell Luster has been struggling with her anxiety and depression.  Her daughter feels as though she gets overwhelmed pretty easily and does not enjoy things that she used to do.  She is currently managed with Prozac 40 mg daily and Klonopin 0.5 mg as needed. The patient feels like her symptoms are from a dear friend passing away about a year ago. She feels as though her symptoms are getting better but she is contemplating seeing a therapist. She does not want to change her medication   There are more, she also reports issues with choking while eating.  She often will feel as though food gets stuck in her throat which causes her to gag and vomit on occasion.   Review of Systems See HPI   Past Medical History:  Diagnosis Date   Benign hematuria    neg urology workup   Diabetes mellitus without complication ([place])    Esophageal dysmotility    Hyperlipidemia    Hypertension    Migraines    Nicotine dependence, cigarettes, uncomplicated 24/40/1027   Osteoporosis    Tobacco abuse     Social History   Socioeconomic History   Marital status: Married    Spouse name: Not on file   Number of children: Not on file   Years of education: Not on file   Highest education level:  Not on file  Occupational History   Occupation: OWNER    Employer: [employer]    Comment: Restaurant  Tobacco Use   Smoking status: Every Day    Packs/day: 2.00    Years: 40.00    Total pack years: 80.00    Types: Cigarettes   Smokeless tobacco: Never   Tobacco comments:    1ppd  Vaping Use   Vaping Use: Never used  Substance and Sexual Activity   Alcohol use: No   Drug use: No   Sexual activity: Not Currently  Other Topics Concern   Not on file  Social History Narrative   ** Merged History Encounter **       Owns a home  She is married  Two children      Social Determinants of Health   Financial Resource Strain: Low Risk  (08/02/2022)   Overall Financial Resource Strain (CARDIA)    Difficulty of Paying Living Expenses: Not hard at all  Food Insecurity: No Food Insecurity (08/02/2022)   Hunger Vital Sign    Worried About Running Out of Food in the Last Year: Never true    Ran Out of Food in the Last Year: Never true  Transportation Needs: No Transportation Needs (08/02/2022)   PRAPARE - Hydrologist (Medical): No    Lack of Transportation (Non-Medical): No  Physical Activity: Inactive (08/02/2022)   Exercise Vital Sign    Days  of Exercise per Week: 0 days    Minutes of Exercise per Session: 0 min  Stress: No Stress Concern Present (08/02/2022)   Lydia    Feeling of Stress : Not at all  Social Connections: Moderately Isolated (08/02/2022)   Social Connection and Isolation Panel [NHANES]    Frequency of Communication with Friends and Family: More than three times a week    Frequency of Social Gatherings with Friends and Family: More than three times a week    Attends Religious Services: Never    Marine scientist or Organizations: No    Attends Archivist Meetings: Never    Marital Status: Married  Human resources officer Violence: Not At Risk (08/02/2022)   Humiliation,  Afraid, Rape, and Kick questionnaire    Fear of Current or Ex-Partner: No    Emotionally Abused: No    Physically Abused: No    Sexually Abused: No    Past Surgical History:  Procedure Laterality Date   ABDOMINAL HYSTERECTOMY     MOUTH SURGERY      Family History  Problem Relation Age of Onset   Ovarian cancer Mother    Diabetes Mother    Hypertension Mother    Hyperlipidemia Mother    Diabetes Father    Hypertension Father    Hyperlipidemia Father    Hypertension Brother    Hypertension Daughter    Asthma Daughter    Hypertension Daughter     Allergies  Allergen Reactions   Phenylpropanolamine Nausea And Vomiting and Other (See Comments)    "Tavist-D"   Prednisone Nausea Only and Other (See Comments)    Can tolerate via IV, but not orally   Codeine Nausea And Vomiting   Nicoderm [Nicotine] Rash and Other (See Comments)    Patient said she is ALLERGIC to the adhesive   Rocephin [Ceftriaxone] Rash and Other (See Comments)    06/11/22 got "redness on the neck and face" after an infusion of Rocephin   Tavist [Clemastine] Nausea And Vomiting    "Tavist-D"    Current Outpatient Medications on File Prior to Visit  Medication Sig Dispense Refill   albuterol (VENTOLIN HFA) 108 (90 Base) MCG/ACT inhaler Inhale 1-2 puffs into the lungs every 6 (six) hours as needed for wheezing or shortness of breath. (Patient taking differently: Inhale 1-2 puffs into the lungs See admin instructions. Inhale 1-2 puffs into the lungs every four to six hours as needed for shortness of breath or wheezing) 108 g 0   amLODipine (NORVASC) 10 MG tablet Take 1 tablet (10 mg total) by mouth daily. 90 tablet 2   aspirin EC 81 MG tablet Take 81 mg by mouth daily as needed for mild pain. Swallow whole.     B Complex-C (B-COMPLEX WITH VITAMIN C) tablet Take 1 tablet by mouth daily.     clonazePAM (KLONOPIN) 0.5 MG tablet Take 1 tablet (0.5 mg total) by mouth 2 (two) times daily as needed. for sleep and  anxiety 60 tablet 2   fexofenadine (ALLEGRA) 180 MG tablet Take 1 tablet (180 mg total) by mouth daily. 90 tablet 1   FLUoxetine (PROZAC) 40 MG capsule Take 1 capsule (40 mg total) by mouth daily. (Patient taking differently: Take 20 mg by mouth daily.) 90 capsule 1   fluticasone-salmeterol (ADVAIR) 100-50 MCG/ACT AEPB Inhale 1 puff into the lungs 2 times daily. 60 each 6   glipiZIDE (GLUCOTROL XL) 5 MG 24 hr tablet TAKE  1 TABLET(5 MG) BY MOUTH DAILY WITH BREAKFAST 90 tablet 0   guaifenesin (HUMIBID E) 400 MG TABS tablet Take 1 tablet 3 times daily as needed for chest congestion and cough 21 tablet 0   hydrochlorothiazide (HYDRODIURIL) 25 MG tablet TAKE 1 TABLET(25 MG) BY MOUTH DAILY 90 tablet 1   lisinopril (ZESTRIL) 40 MG tablet TAKE 1 TABLET(40 MG) BY MOUTH DAILY 90 tablet 3   metFORMIN (GLUCOPHAGE) 500 MG tablet TAKE 1 TABLET BY MOUTH TWICE DAILY WITH A MEAL 180 tablet 0   mometasone (NASONEX) 50 MCG/ACT nasal spray Place 2 sprays into the nose daily. 1 each 2   montelukast (SINGULAIR) 10 MG tablet Take 1 tablet (10 mg total) by mouth at bedtime. 30 tablet 2   Multiple Vitamin (MULTIVITAMIN WITH MINERALS) TABS Take 1 tablet by mouth daily with breakfast.     naproxen (NAPROSYN) 500 MG tablet Take 500 mg by mouth 2 (two) times daily as needed for mild pain.     promethazine-dextromethorphan (PROMETHAZINE-DM) 6.25-15 MG/5ML syrup Take 5 mLs by mouth at bedtime as needed for cough. 60 mL 0   simvastatin (ZOCOR) 20 MG tablet Take 1 tablet (20 mg total) by mouth daily. 90 tablet 3   spironolactone (ALDACTONE) 25 MG tablet TAKE 1 TABLET(25 MG) BY MOUTH DAILY 90 tablet 1   triamcinolone cream (KENALOG) 0.1 % Apply 1 Application topically 2 (two) times daily. 30 g 0   TYLENOL 500 MG tablet Take 500-1,000 mg by mouth every 6 (six) hours as needed for mild pain or headache.     No current facility-administered medications on file prior to visit.    BP 138/68   Pulse 88   Temp 97.8 F (36.6 C)  (Oral)   Ht 5' (1.524 m)   Wt 130 lb (59 kg)   SpO2 95%   BMI 25.39 kg/m       Objective:   Physical Exam Vitals and nursing note reviewed.  Constitutional:      Appearance: Normal appearance.  HENT:     Mouth/Throat:     Mouth: Mucous membranes are moist.     Pharynx: Oropharynx is clear.  Eyes:     Extraocular Movements: Extraocular movements intact.     Conjunctiva/sclera: Conjunctivae normal.     Pupils: Pupils are equal, round, and reactive to light.  Cardiovascular:     Rate and Rhythm: Normal rate and regular rhythm.     Pulses: Normal pulses.     Heart sounds: Normal heart sounds.  Pulmonary:     Effort: Pulmonary effort is normal.     Breath sounds: Wheezing present. No rhonchi or rales.  Skin:    General: Skin is warm and dry.  Neurological:     General: No focal deficit present.     Mental Status: She is alert and oriented to person, place, and time.  Psychiatric:        Mood and Affect: Mood normal.        Behavior: Behavior normal.        Thought Content: Thought content normal.        Judgment: Judgment normal.       Assessment & Plan:  1. Respiratory infection -  We do not have xray in the office today but will treat and cover for PNA. - Advised follow up  instructions  - doxycycline (VIBRAMYCIN) 100 MG capsule; Take 1 capsule (100 mg total) by mouth 2 (two) times daily.  Dispense: 14 capsule; Refill: 0 -  predniSONE (DELTASONE) 10 MG tablet; 40 mg x 3 days, 20 mg x 3 days, 10 mg x 3 days  Dispense: 21 tablet; Refill: 0  2. Dysphagia, unspecified type  - Ambulatory referral to Gastroenterology  3. Adjustment disorder with mixed anxiety and depressed mood - Information given on local therapist.  - No change in medications at this time   [name], NP

## 2022-12-19 ENCOUNTER — Other Ambulatory Visit: Payer: Self-pay | Admitting: Adult Health

## 2022-12-19 DIAGNOSIS — E119 Type 2 diabetes mellitus without complications: Secondary | ICD-10-CM

## 2023-01-04 ENCOUNTER — Other Ambulatory Visit: Payer: Self-pay | Admitting: Adult Health

## 2023-01-09 ENCOUNTER — Other Ambulatory Visit: Payer: Self-pay | Admitting: Adult Health

## 2023-01-09 DIAGNOSIS — I10 Essential (primary) hypertension: Secondary | ICD-10-CM

## 2023-01-15 ENCOUNTER — Ambulatory Visit
Admission: EM | Admit: 2023-01-15 | Discharge: 2023-01-15 | Disposition: A | Discharge status: Home / Self Care | Payer: PPO | Attending: Emergency Medicine | Admitting: Emergency Medicine

## 2023-01-15 ENCOUNTER — Ambulatory Visit (INDEPENDENT_AMBULATORY_CARE_PROVIDER_SITE_OTHER): Payer: PPO

## 2023-01-15 DIAGNOSIS — R52 Pain, unspecified: Secondary | ICD-10-CM | POA: Diagnosis not present

## 2023-01-15 DIAGNOSIS — R059 Cough, unspecified: Secondary | ICD-10-CM

## 2023-01-15 DIAGNOSIS — R0602 Shortness of breath: Secondary | ICD-10-CM | POA: Diagnosis not present

## 2023-01-15 DIAGNOSIS — I700 Atherosclerosis of aorta: Secondary | ICD-10-CM | POA: Diagnosis not present

## 2023-01-15 DIAGNOSIS — B349 Viral infection, unspecified: Secondary | ICD-10-CM | POA: Diagnosis not present

## 2023-01-15 DIAGNOSIS — J441 Chronic obstructive pulmonary disease with (acute) exacerbation: Secondary | ICD-10-CM | POA: Diagnosis not present

## 2023-01-15 LAB — POCT URINALYSIS DIP (MANUAL ENTRY)
Bilirubin, UA: NEGATIVE
Glucose, UA: NEGATIVE mg/dL
Ketones, POC UA: NEGATIVE mg/dL
Leukocytes, UA: NEGATIVE
Nitrite, UA: NEGATIVE
Protein Ur, POC: 100 mg/dL — AB
Spec Grav, UA: >=1.03 — AB (ref 1.010–1.025)
Urobilinogen, UA: 0.2 E.U./dL
pH, UA: 5.5 (ref 5.0–8.0)

## 2023-01-15 LAB — POCT INFLUENZA A/B
Influenza A, POC: NEGATIVE
Influenza B, POC: NEGATIVE

## 2023-01-15 LAB — POC SARS CORONAVIRUS 2 AG -  ED: SARS Coronavirus 2 Ag: NEGATIVE

## 2023-01-15 MED ORDER — DEXAMETHASONE SODIUM PHOSPHATE 10 MG/ML IJ SOLN
10.0000 mg | Freq: Once | INTRAMUSCULAR | Status: AC
  Administered 2023-01-15: 10 mg via INTRAMUSCULAR

## 2023-01-15 MED ORDER — ALBUTEROL SULFATE 108 (90 BASE) MCG/ACT IN AEPB: 2.0000 | INHALATION_SPRAY | Freq: Four times a day (QID) | RESPIRATORY_TRACT | 1 refills | Status: DC | PRN

## 2023-01-15 MED ORDER — AZITHROMYCIN 250 MG PO TABS: ORAL_TABLET | ORAL | 0 refills | Status: AC

## 2023-01-15 NOTE — ED Provider Notes (Signed)
[name]    CSN: [id number] Arrival date & time: 01/15/23  [id number]    HISTORY   Chief Complaint  Patient presents with   Cough   Shortness of Breath   Generalized Body Aches   HPI Shelly Clark is a pleasant, 77 y.o. female who presents to urgent care today. Patient complains of cough, intermittent episodes of shortness of breath, body aches x 3 to 4 days.  Patient states she is also had nasal congestion and runny nose.  Patient reports a history of mild environmental allergies, states she takes a spoonful of local honey every day as she has for the past 40 years.  Patient states she has been seen by pulmonology by COPD, states she has been diagnosed with asthma, needs refill of her albuterol inhaler.  Patient denies fever, joint pain, body ache other than chronic muscle aches due to "working too hard", nausea, vomiting, diarrhea, loss of taste or smell.  Patient states cough is not productive but frequent and usually worse at night.  Patient states episodes of shortness of breath are relieved by her albuterol.  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  Daughter is asking for screening urinalysis today.  The history is provided by the patient.   Past Medical History:  Diagnosis Date   Benign hematuria    neg urology workup   Diabetes mellitus without complication ([place])    Esophageal dysmotility    Hyperlipidemia    Hypertension    Migraines    Nicotine dependence, cigarettes, uncomplicated [id number]   Osteoporosis    Tobacco abuse    Patient Active Problem List   Diagnosis Date Noted   Hyponatremia 06/11/2022    Asymptomatic bacteriuria 06/11/2022   COPD with acute exacerbation ([place]) 06/11/2022   E coli bacteremia 11/13/2020   Hypokalemia    Wheezing on expiration    Dehydration with hyponatremia 11/12/2020   Mixed hyperlipidemia due to type 2 diabetes mellitus ([place]) 11/12/2020   GERD without esophagitis 11/12/2020   Nicotine dependence, cigarettes, uncomplicated 123456   Hypokalemia, inadequate intake 11/12/2020   E. coli UTI 11/12/2020   Sepsis due to gram-negative UTI ([place]) 11/11/2020   Dysphagia 02/24/2015   Adjustment disorder with mixed anxiety and depressed mood 02/10/2013   Hyperlipidemia 10/30/2007   TOBACCO ABUSE 10/30/2007   Essential hypertension 10/30/2007   UNSPECIFIED OSTEOPOROSIS 10/30/2007   Type 2 diabetes mellitus, controlled ([place]) 10/30/2007   Past Surgical History:  Procedure Laterality Date   ABDOMINAL HYSTERECTOMY     MOUTH SURGERY     OB History   No obstetric history on file.    Home Medications    Prior to Admission medications   Medication Sig Start Date End Date Taking? Authorizing Provider  albuterol (VENTOLIN HFA) 108 (90 Base) MCG/ACT inhaler Inhale 1-2 puffs into the lungs every 6 (six) hours as needed for wheezing or shortness of breath. Patient taking differently: Inhale 1-2 puffs into the lungs See admin instructions. Inhale 1-2 puffs into the lungs every four to six hours as needed for shortness of breath or wheezing 06/10/22  Yes Maudie Flakes, MD  amLODipine (NORVASC) 10 MG  tablet Take 1 tablet (10 mg total) by mouth daily. 07/26/22   [name], NP  aspirin EC 81 MG tablet Take 81 mg by mouth daily as needed for mild pain. Swallow whole.    [provider]  B Complex-C (B-COMPLEX WITH VITAMIN C) tablet Take 1 tablet by mouth daily.    [provider]  clonazePAM (KLONOPIN) 0.5 MG tablet Take 1 tablet (0.5 mg total) by mouth 2 (two) times daily as needed. for sleep and anxiety 07/18/22   Nafziger, Tommi Rumps, NP  doxycycline  (VIBRAMYCIN) 100 MG capsule Take 1 capsule (100 mg total) by mouth 2 (two) times daily. 12/12/22   Nafziger, Tommi Rumps, NP  fexofenadine (ALLEGRA) 180 MG tablet Take 1 tablet (180 mg total) by mouth daily. 12/09/22 06/07/23  Lynden Oxford Scales, PA-C  FLUoxetine (PROZAC) 40 MG capsule Take 1 capsule (40 mg total) by mouth daily. Patient taking differently: Take 20 mg by mouth daily. 07/18/22   Nafziger, Tommi Rumps, NP  fluticasone-salmeterol (ADVAIR) 100-50 MCG/ACT AEPB Inhale 1 puff into the lungs 2 times daily. 10/12/22   Nafziger, Tommi Rumps, NP  glipiZIDE (GLUCOTROL XL) 5 MG 24 hr tablet TAKE 1 TABLET(5 MG) BY MOUTH DAILY WITH BREAKFAST 12/19/22   Nafziger, Tommi Rumps, NP  guaifenesin (HUMIBID E) 400 MG TABS tablet Take 1 tablet 3 times daily as needed for chest congestion and cough 12/09/22   Lynden Oxford Scales, PA-C  hydrochlorothiazide (HYDRODIURIL) 25 MG tablet TAKE 1 TABLET(25 MG) BY MOUTH DAILY 01/09/23   Nafziger, Tommi Rumps, NP  lisinopril (ZESTRIL) 40 MG tablet TAKE 1 TABLET(40 MG) BY MOUTH DAILY 09/12/22   Nafziger, Tommi Rumps, NP  metFORMIN (GLUCOPHAGE) 500 MG tablet TAKE 1 TABLET BY MOUTH TWICE DAILY WITH A MEAL 01/04/23   Nafziger, Tommi Rumps, NP  mometasone (NASONEX) 50 MCG/ACT nasal spray Place 2 sprays into the nose daily. 12/09/22 03/09/23  Lynden Oxford Scales, PA-C  montelukast (SINGULAIR) 10 MG tablet Take 1 tablet (10 mg total) by mouth at bedtime. 12/09/22 03/09/23  Lynden Oxford Scales, PA-C  Multiple Vitamin (MULTIVITAMIN WITH MINERALS) TABS Take 1 tablet by mouth daily with breakfast.    [provider]  naproxen (NAPROSYN) 500 MG tablet Take 500 mg by mouth 2 (two) times daily as needed for mild pain.    [provider]  predniSONE (DELTASONE) 10 MG tablet 40 mg x 3 days, 20 mg x 3 days, 10 mg x 3 days 12/12/22   [name], NP  promethazine-dextromethorphan (PROMETHAZINE-DM) 6.25-15 MG/5ML syrup Take 5 mLs by mouth at bedtime as needed for cough. 12/09/22   Lynden Oxford Scales, PA-C   simvastatin (ZOCOR) 20 MG tablet Take 1 tablet (20 mg total) by mouth daily. 07/11/22   Nafziger, Tommi Rumps, NP  spironolactone (ALDACTONE) 25 MG tablet TAKE 1 TABLET(25 MG) BY MOUTH DAILY 10/12/22   Nafziger, Tommi Rumps, NP  triamcinolone cream (KENALOG) 0.1 % Apply 1 Application topically 2 (two) times daily. 10/12/22   Nafziger, Tommi Rumps, NP  TYLENOL 500 MG tablet Take 500-1,000 mg by mouth every 6 (six) hours as needed for mild pain or headache.    [provider]    Family History Family History  Problem Relation Age of Onset   Ovarian cancer Mother    Diabetes Mother    Hypertension Mother    Hyperlipidemia Mother    Diabetes Father    Hypertension Father    Hyperlipidemia Father    Hypertension Brother    Hypertension Daughter    Asthma Daughter    Hypertension Daughter  Social History Social History   Tobacco Use   Smoking status: Every Day    Packs/day: 2.00    Years: 40.00    Total pack years: 80.00    Types: Cigarettes   Smokeless tobacco: Never   Tobacco comments:    1ppd  Vaping Use   Vaping Use: Never used  Substance Use Topics   Alcohol use: No   Drug use: No   Allergies   Phenylpropanolamine, Prednisone, Codeine, Nicoderm [nicotine], Rocephin [ceftriaxone], and Tavist [clemastine]  Review of Systems Review of Systems Pertinent findings revealed after performing a 14 point review of systems has been noted in the history of present illness.  Physical Exam Vital Signs BP 128/70   Pulse 76   Temp 98.4 F (36.9 C) (Oral)   Resp 20   SpO2 95%   No data found.  Physical Exam Vitals and nursing note reviewed.  Constitutional:      General: She is not in acute distress.    Appearance: Normal appearance. She is not ill-appearing.  HENT:     Head: Normocephalic and atraumatic.     Salivary Glands: Right salivary gland is not diffusely enlarged or tender. Left salivary gland is not diffusely enlarged or tender.     Right Ear: Tympanic membrane, ear  canal and external ear normal. No drainage. No middle ear effusion. There is no impacted cerumen. Tympanic membrane is not erythematous or bulging.     Left Ear: Tympanic membrane, ear canal and external ear normal. No drainage.  No middle ear effusion. There is no impacted cerumen. Tympanic membrane is not erythematous or bulging.     Nose: Mucosal edema, congestion and rhinorrhea present. No nasal deformity or septal deviation. Rhinorrhea is clear.     Right Turbinates: Not enlarged, swollen or pale.     Left Turbinates: Not enlarged, swollen or pale.     Right Sinus: No maxillary sinus tenderness or frontal sinus tenderness.     Left Sinus: No maxillary sinus tenderness or frontal sinus tenderness.     Mouth/Throat:     Lips: Pink. No lesions.     Mouth: Mucous membranes are moist. No oral lesions.     Pharynx: Oropharynx is clear. Uvula midline. No pharyngeal swelling, oropharyngeal exudate, posterior oropharyngeal erythema or uvula swelling.     Tonsils: No tonsillar exudate. 0 on the right. 0 on the left.  Eyes:     General: Lids are normal. No allergic shiner.       Right eye: No discharge.        Left eye: No discharge.     Extraocular Movements: Extraocular movements intact.     Conjunctiva/sclera: Conjunctivae normal.     Right eye: Right conjunctiva is not injected.     Left eye: Left conjunctiva is not injected.     Pupils: Pupils are equal, round, and reactive to light.  Neck:     Thyroid: No thyroid mass or thyroid tenderness.     Trachea: Trachea and phonation normal.  Cardiovascular:     Rate and Rhythm: Normal rate and regular rhythm.     Pulses: Normal pulses.     Heart sounds: Normal heart sounds, S1 normal and S2 normal. No murmur heard.    No friction rub. No gallop.  Pulmonary:     Effort: Pulmonary effort is normal. No tachypnea, bradypnea, accessory muscle usage, prolonged expiration, respiratory distress or retractions.     Breath sounds: No stridor, decreased  air movement or transmitted  upper airway sounds. Examination of the right-upper field reveals decreased breath sounds. Examination of the left-upper field reveals decreased breath sounds. Decreased breath sounds present. No wheezing, rhonchi or rales.  Chest:     Chest wall: No tenderness.  Musculoskeletal:        General: Normal range of motion.     Cervical back: Full passive range of motion without pain, normal range of motion and neck supple. Normal range of motion.  Lymphadenopathy:     Cervical: No cervical adenopathy.  Skin:    General: Skin is warm and dry.     Findings: No erythema or rash.  Neurological:     General: No focal deficit present.     Mental Status: She is alert and oriented to person, place, and time.  Psychiatric:        Mood and Affect: Mood normal.        Behavior: Behavior normal.     Visual Acuity Right Eye Distance:   Left Eye Distance:   Bilateral Distance:    Right Eye Near:   Left Eye Near:    Bilateral Near:     UC Couse / Diagnostics / Procedures:     Radiology DG Chest 2 View  Result Date: 01/15/2023 CLINICAL DATA:  Cough EXAM: CHEST - 2 VIEW COMPARISON:  07/09/2022 FINDINGS: The heart size and mediastinal contours are within normal limits. Aortic atherosclerosis. Mildly coarsened interstitial markings bilaterally. No focal airspace consolidation, pleural effusion, or pneumothorax. The visualized skeletal structures are unremarkable. IMPRESSION: Mildly coarsened interstitial markings bilaterally, which may reflect bronchitic type lung changes. No focal airspace consolidation. Electronically Signed   By: Davina Poke D.O.   On: 01/15/2023 09:31    Procedures Procedures (including critical care time) EKG  Pending results:  Labs Reviewed  POCT URINALYSIS DIP (MANUAL ENTRY) - Abnormal; Notable for the following components:      Result Value   Spec Grav, UA >=1.030 (*)    Blood, UA trace-intact (*)    Protein Ur, POC =100 (*)    All  other components within normal limits  URINE CULTURE  POCT INFLUENZA A/B  POC SARS CORONAVIRUS 2 AG -  ED    Medications Ordered in UC: Medications  dexamethasone (DECADRON) injection 10 mg (10 mg Intramuscular Given 01/15/23 0947)    UC Diagnoses / Final Clinical Impressions(s)   I have reviewed the triage vital signs and the nursing notes.  Pertinent labs & imaging results that were available during my care of the patient were reviewed by me and considered in my medical decision making (see chart for details).    Final diagnoses:  COPD exacerbation ([place])  Viral illness  Patient provided with an injection of dexamethasone during her visit today to calm asthma related symptoms and shortness of breath.  Patient provided with a prescription for azithromycin given history of COPD and rhonchorous cough.  Patient advised to resume allergy medications as previously prescribed and to continue all asthma medications.  Return precautions advised.  Urine dip revealed some blood and protein, will send urine for culture and treat with antibiotics if needed based on culture results.  Return precautions advised.  Please see discharge instructions below for further details of plan of care as provided to patient. ED Prescriptions     Medication Sig Dispense Auth. Provider   Albuterol Sulfate (PROAIR RESPICLICK) [id number] (90 Base) MCG/ACT AEPB Inhale 2 puffs into the lungs 4 (four) times daily as needed. 3 each Lynden Oxford Scales, PA-C  azithromycin (ZITHROMAX) 250 MG tablet Take 2 tablets (500 mg total) by mouth daily for 1 day, THEN 1 tablet (250 mg total) daily for 4 days. 6 tablet Lynden Oxford Scales, PA-C      PDMP not reviewed this encounter.  Disposition Upon Discharge:  Condition: stable for discharge home Home: take medications as prescribed; routine discharge instructions as discussed; follow up as advised.  Patient presented with an acute illness with associated systemic symptoms  and significant discomfort requiring urgent management. In my opinion, this is a condition that a prudent lay person (someone who possesses an average knowledge of health and medicine) may potentially expect to result in complications if not addressed urgently such as respiratory distress, impairment of bodily function or dysfunction of bodily organs.   Routine symptom specific, illness specific and/or disease specific instructions were discussed with the patient and/or caregiver at length.   As such, the patient has been evaluated and assessed, work-up was performed and treatment was provided in alignment with urgent care protocols and evidence based medicine.  Patient/parent/caregiver has been advised that the patient may require follow up for further testing and treatment if the symptoms continue in spite of treatment, as clinically indicated and appropriate.  If the patient was tested for COVID-19, Influenza and/or RSV, then the patient/parent/guardian was advised to isolate at home pending the results of his/her diagnostic coronavirus test and potentially longer if they're positive. I have also advised pt that if his/her COVID-19 test returns positive, it's recommended to self-isolate for at least 10 days after symptoms first appeared AND until fever-free for 24 hours without fever reducer AND other symptoms have improved or resolved. Discussed self-isolation recommendations as well as instructions for household member/close contacts as per the [facility] and [place] DHHS, and also gave patient the [place] packet with this information.  Patient/parent/caregiver has been advised to return to the [facility] or PCP in 3-5 days if no better; to PCP or the Emergency Department if new signs and symptoms develop, or if the current signs or symptoms continue to change or worsen for further workup, evaluation and treatment as clinically indicated and appropriate  The patient will follow up with their current PCP if and as advised.  If the patient does not currently have a PCP we will assist them in obtaining one.   The patient may need specialty follow up if the symptoms continue, in spite of conservative treatment and management, for further workup, evaluation, consultation and treatment as clinically indicated and appropriate.  Patient/parent/caregiver verbalized understanding and agreement of plan as discussed.  All questions were addressed during visit.  Please see discharge instructions below for further details of plan.  Discharge Instructions:   Discharge Instructions      Your rapid influenza antigen test today was negative.  No further influenza testing is indicated.  Your COVID-19 PCR test is negative. Please consider retesting in the next 2 to 3 days, particularly if you are not feeling any better.  You are welcome to return here to urgent care to have it done or you can take a home COVID-19 test.    If both your COVID-19 tests are negative, then you can safely assume that your illness is due to one of the many less serious illnesses circulating in our community right now.     Based on my physical exam findings and the history you have provided  today, I do not recommend antibiotics at this time.  I do not believe the risks and side effects  of antibiotics would outweigh any minimal benefit that they might provide.      Your chest x-ray was not concerning for pneumonia or bronchitis.  At this time, I believe that you are suffering from an exacerbation of your COPD and I recommend short course of antibiotics, short course of steroids and resuming allergy medications in addition to continuing your asthma medication.   Please read below to learn more about the medications, dosages and frequencies that I recommend to help alleviate your symptoms and to get you feeling better soon:   Decadron IM (dexamethasone):  To quickly address your significant respiratory inflammation, you were provided with an injection of  Decadron in the office today.  You should continue to feel the full benefit of the steroid for the next 12-24 hours.   Z-Pak (azithromycin):  Please take two (2) tablets on day one and one tablet daily thereafter until the prescription is complete.This antibiotic can cause upset stomach, this will resolve once antibiotics are complete.  You are welcome to take a probiotic, eat yogurt, take Imodium while taking this medication.  Please avoid other systemic medications such as Maalox, Pepto-Bismol or milk of magnesia as they can interfere with the body's ability to absorb the antibiotics.      Allegra (fexofenadine): This is an excellent second-generation antihistamine that helps to reduce respiratory inflammatory response to environmental allergens.  This medication is not known to cause daytime sleepiness so it can be taken in the daytime.  If you find that it does make you sleepy, please feel free to take it at bedtime.  Singulair (montelukast): This is a mast cell stabilizer that works well with antihistamines.  Mast cells are responsible for stimulating histamine production.  Reducing the activity of mast cells decreases the amount of histamines will be produced.  This reduces upper and lower respiratory inflammation caused by allergy exposure.  I recommend that you take this medication at the same time you take your antihistamine.   Nasonex (mometasone): This is a steroid nasal spray that used once daily, 1 spray in each nare.  This works best when used on a daily basis. This medication does not work well if it is only used when you think you need it.  After 3 to 5 days of use, you will notice significant reduction of the inflammation and mucus production that is currently being caused by exposure to allergens, whether seasonal or environmental.  The most common side effect of this medication is nosebleeds.  If you experience a nosebleed, please discontinue use for 1 week, then feel free to resume.   If  you find that your insurance will not pay for this medication, please consider a different nasal steroids such as Flonase (fluticasone), or Nasacort (triamcinolone).    ProAir, Ventolin, Proventil (albuterol): This inhaled medication contains a short acting beta agonist bronchodilator.  This medication relaxes the smooth muscle of the airway in the lungs.  When these muscles are tight, breathing becomes more constricted.  The result of relaxation of the smooth muscle is increased air movement and improved work of breathing.  This is a short acting medication that can be used every 4-6 hours as needed for increased work of breathing, shortness of breath, wheezing and excessive coughing.  It comes in the form of a handheld inhaler or nebulizer solution.  I recommended that for the next 3 to 4 days, this medication is used 4 times daily on a scheduled basis then decrease to twice daily and as  needed until symptoms have completely resolved which I anticipate will be several weeks.   Advair (fluticasone and salmeterol): Please inhale 2 puffs twice daily.  This inhaled medication contains a corticosteroid and long-acting form of albuterol.  The inhaled steroid and this medication  is not absorbed into the body and will not cause side effects such as increased blood sugar levels, irritability, sleeplessness or weight gain.  Inhaled corticosteroid are sort of like topical steroid creams but, as you can imagine, it is not practical to attempt to rub a steroid cream inside of your lungs.  The long-acting albuterol works similarly to the short acting albuterol found in your rescue inhaler but provides 24-hour relaxation of the smooth muscles that open and constrict your airways; your short acting rescue inhaler can only provide for a few hours this benefit for a few hours.  Please feel free to continue using your short acting rescue inhaler as often as needed throughout the day for shortness of breath, wheezing, and  cough.    Robitussin, Mucinex (guaifenesin): This is an expectorant.  This helps break up chest congestion and loosen up thick nasal drainage making phlegm and drainage more liquid and therefore easier to remove.  I recommend being 400 mg three times daily as needed.      Please follow-up with either your primary care provider or with urgent care for repeat evaluation you of your lungs in the next 3 to 4 days to ensure that you are improving and also to evaluate whether or not your treatment regimen needs to be adjusted.  The urinalysis that we performed in the clinic today was abnormal.  Urine culture will be performed per our protocol.  The result of the urine culture will be available in the next 3 to 5 days and will be posted to your MyChart account.  If there is an abnormal finding, you will be contacted by phone and advised of treatment recommendations.         Thank you for visiting urgent care today.  We appreciate the opportunity to participate in your care.       This office note has been dictated using Museum/gallery curator.  Unfortunately, this method of dictation can sometimes lead to typographical or grammatical errors.  I apologize for your inconvenience in advance if this occurs.  Please do not hesitate to reach out to me if clarification is needed.      Lynden Oxford Scales, PA-C 01/15/23 1050

## 2023-01-15 NOTE — Discharge Instructions (Addendum)
Your rapid influenza antigen test today was negative.  No further influenza testing is indicated.  Your COVID-19 PCR test is negative. Please consider retesting in the next 2 to 3 days, particularly if you are not feeling any better.  You are welcome to return here to urgent care to have it done or you can take a home COVID-19 test.    If both your COVID-19 tests are negative, then you can safely assume that your illness is due to one of the many less serious illnesses circulating in our community right now.     Based on my physical exam findings and the history you have provided  today, I do not recommend antibiotics at this time.  I do not believe the risks and side effects of antibiotics would outweigh any minimal benefit that they might provide.      Your chest x-ray was not concerning for pneumonia or bronchitis.  At this time, I believe that you are suffering from an exacerbation of your COPD and I recommend short course of antibiotics, short course of steroids and resuming allergy medications in addition to continuing your asthma medication.   Please read below to learn more about the medications, dosages and frequencies that I recommend to help alleviate your symptoms and to get you feeling better soon:   Decadron IM (dexamethasone):  To quickly address your significant respiratory inflammation, you were provided with an injection of Decadron in the office today.  You should continue to feel the full benefit of the steroid for the next 12-24 hours.   Z-Pak (azithromycin):  Please take two (2) tablets on day one and one tablet daily thereafter until the prescription is complete.This antibiotic can cause upset stomach, this will resolve once antibiotics are complete.  You are welcome to take a probiotic, eat yogurt, take Imodium while taking this medication.  Please avoid other systemic medications such as Maalox, Pepto-Bismol or milk of magnesia as they can interfere with the body's ability to  absorb the antibiotics.      Allegra (fexofenadine): This is an excellent second-generation antihistamine that helps to reduce respiratory inflammatory response to environmental allergens.  This medication is not known to cause daytime sleepiness so it can be taken in the daytime.  If you find that it does make you sleepy, please feel free to take it at bedtime.  Singulair (montelukast): This is a mast cell stabilizer that works well with antihistamines.  Mast cells are responsible for stimulating histamine production.  Reducing the activity of mast cells decreases the amount of histamines will be produced.  This reduces upper and lower respiratory inflammation caused by allergy exposure.  I recommend that you take this medication at the same time you take your antihistamine.   Nasonex (mometasone): This is a steroid nasal spray that used once daily, 1 spray in each nare.  This works best when used on a daily basis. This medication does not work well if it is only used when you think you need it.  After 3 to 5 days of use, you will notice significant reduction of the inflammation and mucus production that is currently being caused by exposure to allergens, whether seasonal or environmental.  The most common side effect of this medication is nosebleeds.  If you experience a nosebleed, please discontinue use for 1 week, then feel free to resume.   If you find that your insurance will not pay for this medication, please consider a different nasal steroids such as Flonase (fluticasone),  or Nasacort (triamcinolone).    ProAir, Ventolin, Proventil (albuterol): This inhaled medication contains a short acting beta agonist bronchodilator.  This medication relaxes the smooth muscle of the airway in the lungs.  When these muscles are tight, breathing becomes more constricted.  The result of relaxation of the smooth muscle is increased air movement and improved work of breathing.  This is a short acting medication that  can be used every 4-6 hours as needed for increased work of breathing, shortness of breath, wheezing and excessive coughing.  It comes in the form of a handheld inhaler or nebulizer solution.  I recommended that for the next 3 to 4 days, this medication is used 4 times daily on a scheduled basis then decrease to twice daily and as needed until symptoms have completely resolved which I anticipate will be several weeks.   Advair (fluticasone and salmeterol): Please inhale 2 puffs twice daily.  This inhaled medication contains a corticosteroid and long-acting form of albuterol.  The inhaled steroid and this medication  is not absorbed into the body and will not cause side effects such as increased blood sugar levels, irritability, sleeplessness or weight gain.  Inhaled corticosteroid are sort of like topical steroid creams but, as you can imagine, it is not practical to attempt to rub a steroid cream inside of your lungs.  The long-acting albuterol works similarly to the short acting albuterol found in your rescue inhaler but provides 24-hour relaxation of the smooth muscles that open and constrict your airways; your short acting rescue inhaler can only provide for a few hours this benefit for a few hours.  Please feel free to continue using your short acting rescue inhaler as often as needed throughout the day for shortness of breath, wheezing, and cough.    Robitussin, Mucinex (guaifenesin): This is an expectorant.  This helps break up chest congestion and loosen up thick nasal drainage making phlegm and drainage more liquid and therefore easier to remove.  I recommend being 400 mg three times daily as needed.      Please follow-up with either your primary care provider or with urgent care for repeat evaluation you of your lungs in the next 3 to 4 days to ensure that you are improving and also to evaluate whether or not your treatment regimen needs to be adjusted.  The urinalysis that we performed in the  clinic today was abnormal.  Urine culture will be performed per our protocol.  The result of the urine culture will be available in the next 3 to 5 days and will be posted to your MyChart account.  If there is an abnormal finding, you will be contacted by phone and advised of treatment recommendations.         Thank you for visiting urgent care today.  We appreciate the opportunity to participate in your care.

## 2023-01-15 NOTE — ED Triage Notes (Signed)
Pt presents to Urgent Care with c/o cough, sob, and body aches x 3-4 days. Has not done COVID test.

## 2023-01-17 ENCOUNTER — Telehealth (HOSPITAL_COMMUNITY): Payer: Self-pay | Admitting: Emergency Medicine

## 2023-01-17 LAB — URINE CULTURE: Culture: 20000 — AB

## 2023-01-17 MED ORDER — SULFAMETHOXAZOLE-TRIMETHOPRIM 800-160 MG PO TABS: 1.0000 | ORAL_TABLET | Freq: Two times a day (BID) | ORAL | 0 refills | Status: AC

## 2023-01-18 ENCOUNTER — Ambulatory Visit: Payer: PPO | Admitting: Adult Health

## 2023-03-09 ENCOUNTER — Other Ambulatory Visit: Payer: Self-pay | Admitting: Adult Health

## 2023-03-31 ENCOUNTER — Other Ambulatory Visit: Payer: Self-pay | Admitting: Adult Health

## 2023-03-31 DIAGNOSIS — E119 Type 2 diabetes mellitus without complications: Secondary | ICD-10-CM

## 2023-04-04 ENCOUNTER — Ambulatory Visit (INDEPENDENT_AMBULATORY_CARE_PROVIDER_SITE_OTHER): Payer: PPO | Admitting: Family Medicine

## 2023-04-04 ENCOUNTER — Encounter: Payer: Self-pay | Admitting: Family Medicine

## 2023-04-04 VITALS — BP 128/60 | HR 88 | Temp 98.7°F | Wt 134.2 lb

## 2023-04-04 DIAGNOSIS — M545 Low back pain, unspecified: Secondary | ICD-10-CM

## 2023-04-04 DIAGNOSIS — N3001 Acute cystitis with hematuria: Secondary | ICD-10-CM | POA: Diagnosis not present

## 2023-04-04 DIAGNOSIS — N390 Urinary tract infection, site not specified: Secondary | ICD-10-CM | POA: Diagnosis not present

## 2023-04-04 LAB — POC URINALSYSI DIPSTICK (AUTOMATED)
Bilirubin, UA: NEGATIVE
Glucose, UA: NEGATIVE
Ketones, UA: NEGATIVE
Protein, UA: POSITIVE — AB
Spec Grav, UA: 1.015 (ref 1.010–1.025)
Urobilinogen, UA: NEGATIVE E.U./dL — AB
pH, UA: 6 (ref 5.0–8.0)

## 2023-04-04 MED ORDER — SULFAMETHOXAZOLE-TRIMETHOPRIM 800-160 MG PO TABS: 1.0000 | ORAL_TABLET | Freq: Two times a day (BID) | ORAL | 0 refills | Status: DC

## 2023-04-04 NOTE — Progress Notes (Signed)
Established Patient Office Visit   Subjective  Patient ID: Shelly Clark, female    DOB: 03-16-1946  Age: 77 y.o. MRN: [id number]  Chief Complaint  Patient presents with   Urinary Tract Infection    Urinary freq, no burning, pain in left side. Feeling for about a week. Back pain was worse yesterday     Patient is a 77 year old female with pmh sig for HTN, COPD, GERD, DM 2, tobacco use, HLD, hypokalemia who is followed by Shirline Frees, NP and seen for acute concern.  Patient endorses suprapubic pain, decreased urinary output, urinary frequency, right flank pain and left lateral low back pain x 6-7 days.  Patient states in the past when she had a UTI she did not have dysuria, but ended up in the hospital for sepsis.  Patient drinking lots of water each day due to prior history of dehydration.  Has not taken anything for symptoms.  Denies fever, chills, nausea, vomiting.  Endorses some constipation.  Urinary Tract Infection       ROS Negative unless stated above    Objective:     BP 128/60 (BP Location: Left Arm, Patient Position: Sitting, Cuff Size: Normal)   Pulse 88   Temp 98.7 F (37.1 C) (Oral)   Wt 134 lb 3.2 oz (60.9 kg)   SpO2 97%   BMI 26.21 kg/m    Physical Exam Constitutional:      General: She is not in acute distress.    Appearance: Normal appearance.  HENT:     Head: Normocephalic and atraumatic.     Nose: Nose normal.     Mouth/Throat:     Mouth: Mucous membranes are moist.  Eyes:     Extraocular Movements: Extraocular movements intact.     Conjunctiva/sclera: Conjunctivae normal.  Cardiovascular:     Rate and Rhythm: Normal rate and regular rhythm.     Heart sounds: Normal heart sounds. No murmur heard.    No gallop.  Pulmonary:     Effort: Pulmonary effort is normal. No respiratory distress.     Breath sounds: Normal breath sounds. No wheezing, rhonchi or rales.  Abdominal:     General: Bowel sounds are normal.     Palpations: Abdomen is  soft.     Tenderness: There is abdominal tenderness in the suprapubic area. There is no right CVA tenderness or left CVA tenderness.  Skin:    General: Skin is warm and dry.  Neurological:     Mental Status: She is alert and oriented to person, place, and time.      Results for orders placed or performed in visit on 04/04/23  POCT Urinalysis Dipstick (Automated)  Result Value Ref Range   Color, UA yellow    Clarity, UA cloudy    Glucose, UA Negative Negative   Bilirubin, UA neg    Ketones, UA neg    Spec Grav, UA 1.015 1.010 - 1.025   Blood, UA 3+    pH, UA 6.0 5.0 - 8.0   Protein, UA Positive (A) Negative   Urobilinogen, UA negative (A) 0.2 or 1.0 E.U./dL   Nitrite, UA +    Leukocytes, UA Large (3+) (A) Negative      Assessment & Plan:  Acute cystitis with hematuria -     Sulfamethoxazole-Trimethoprim; Take 1 tablet by mouth 2 (two) times daily for 7 days.  Dispense: 14 tablet; Refill: 0  Low back pain, unspecified back pain laterality, unspecified chronicity, unspecified whether sciatica present -  POCT Urinalysis Dipstick (Automated) -     Urine Culture  Suprapubic pain, urinary frequency, decreased urinary output with flank pain and back pain.  UA concerning for UTI with 3+ leuks, 3+ blood, 1+ nitrites, 2+ protein.  Obtain UCX.  Start ABX.  Given strict precautions.  Return if symptoms worsen or fail to improve.   Deeann Saint, MD

## 2023-04-06 LAB — URINE CULTURE
MICRO NUMBER:: 14930174
SPECIMEN QUALITY:: ADEQUATE

## 2023-04-10 ENCOUNTER — Other Ambulatory Visit: Payer: Self-pay | Admitting: Adult Health

## 2023-04-10 ENCOUNTER — Ambulatory Visit (INDEPENDENT_AMBULATORY_CARE_PROVIDER_SITE_OTHER): Payer: PPO | Admitting: Adult Health

## 2023-04-10 ENCOUNTER — Encounter: Payer: Self-pay | Admitting: Adult Health

## 2023-04-10 VITALS — BP 120/60 | HR 93 | Temp 98.5°F | Ht 60.0 in | Wt 132.0 lb

## 2023-04-10 DIAGNOSIS — F4323 Adjustment disorder with mixed anxiety and depressed mood: Secondary | ICD-10-CM | POA: Diagnosis not present

## 2023-04-10 DIAGNOSIS — G4700 Insomnia, unspecified: Secondary | ICD-10-CM | POA: Diagnosis not present

## 2023-04-10 DIAGNOSIS — J441 Chronic obstructive pulmonary disease with (acute) exacerbation: Secondary | ICD-10-CM | POA: Diagnosis not present

## 2023-04-10 DIAGNOSIS — Z87440 Personal history of urinary (tract) infections: Secondary | ICD-10-CM | POA: Diagnosis not present

## 2023-04-10 DIAGNOSIS — I10 Essential (primary) hypertension: Secondary | ICD-10-CM

## 2023-04-10 LAB — POCT URINALYSIS DIPSTICK
Bilirubin, UA: NEGATIVE
Blood, UA: NEGATIVE
Glucose, UA: NEGATIVE
Ketones, UA: NEGATIVE
Leukocytes, UA: NEGATIVE
Nitrite, UA: NEGATIVE
Protein, UA: NEGATIVE
Spec Grav, UA: 1.02 (ref 1.010–1.025)
Urobilinogen, UA: 2 E.U./dL — AB
pH, UA: 6 (ref 5.0–8.0)

## 2023-04-10 MED ORDER — ALBUTEROL SULFATE HFA 108 (90 BASE) MCG/ACT IN AERS: 2.0000 | INHALATION_SPRAY | Freq: Four times a day (QID) | RESPIRATORY_TRACT | 2 refills | Status: DC | PRN

## 2023-04-10 MED ORDER — CLONAZEPAM 0.5 MG PO TABS: 0.5000 mg | ORAL_TABLET | Freq: Two times a day (BID) | ORAL | 2 refills | Status: DC | PRN

## 2023-04-10 NOTE — Progress Notes (Signed)
Subjective:    Patient ID: Roxine Caddy, female    DOB: 1946-06-06, 77 y.o.   MRN: [id number]  HPI 77 year old female who  has a past medical history of Benign hematuria, Diabetes mellitus without complication (HCC), Esophageal dysmotility, Hyperlipidemia, Hypertension, Migraines, Nicotine dependence, cigarettes, uncomplicated (11/12/2020), Osteoporosis, and Tobacco abuse.  She presents to the office today for follow-up after being seen by another provider in the office 6 days ago.  At this time she reported suprapubic pain, decreased urinary output, urinary frequency, right flank pain, left lateral low back pain for 6 or 7 days prior.  Denies any fevers, chills, nausea, or vomiting.  Her urinalysis with 3+ leuks, 3+ blood, 1+ nitrates, 2+ protein.  He was started on Bactrim twice daily x 7 days.   Urine culture was positive for E. coli bacteremia that was sensitive to Bactrim. Today she reports that she is feeling better and back to normal. No signs of UTI. She would like to make sure she does not have a UTI.   She also needs a refill of klonopin she takes for anxiety and insomnia  and albuterol inhaler he uses for COPD   Review of Systems See HPI   Past Medical History:  Diagnosis Date   Benign hematuria    neg urology workup   Diabetes mellitus without complication (HCC)    Esophageal dysmotility    Hyperlipidemia    Hypertension    Migraines    Nicotine dependence, cigarettes, uncomplicated 11/12/2020   Osteoporosis    Tobacco abuse     Social History   Socioeconomic History   Marital status: Married    Spouse name: Not on file   Number of children: Not on file   Years of education: Not on file   Highest education level: Not on file  Occupational History   Occupation: OWNER    Employer: [employer]    Comment: Restaurant  Tobacco Use   Smoking status: Every Day    Packs/day: 2.00    Years: 40.00    Additional pack years: 0.00    Total pack years: 80.00    Types:  Cigarettes   Smokeless tobacco: Never   Tobacco comments:    1ppd  Vaping Use   Vaping Use: Never used  Substance and Sexual Activity   Alcohol use: No   Drug use: No   Sexual activity: Not on file  Other Topics Concern   Not on file  Social History Narrative   ** Merged History Encounter **       Owns a home  She is married  Two children      Social Determinants of Health   Financial Resource Strain: Low Risk  (08/02/2022)   Overall Financial Resource Strain (CARDIA)    Difficulty of Paying Living Expenses: Not hard at all  Food Insecurity: No Food Insecurity (08/02/2022)   Hunger Vital Sign    Worried About Running Out of Food in the Last Year: Never true    Ran Out of Food in the Last Year: Never true  Transportation Needs: No Transportation Needs (08/02/2022)   PRAPARE - Administrator, Civil Service (Medical): No    Lack of Transportation (Non-Medical): No  Physical Activity: Inactive (08/02/2022)   Exercise Vital Sign    Days of Exercise per Week: 0 days    Minutes of Exercise per Session: 0 min  Stress: No Stress Concern Present (08/02/2022)   [employer] of Occupational Health -  Occupational Stress Questionnaire    Feeling of Stress : Not at all  Social Connections: Moderately Isolated (08/02/2022)   Social Connection and Isolation Panel [NHANES]    Frequency of Communication with Friends and Family: More than three times a week    Frequency of Social Gatherings with Friends and Family: More than three times a week    Attends Religious Services: Never    Database administrator or Organizations: No    Attends Banker Meetings: Never    Marital Status: Married  Catering manager Violence: Not At Risk (08/02/2022)   Humiliation, Afraid, Rape, and Kick questionnaire    Fear of Current or Ex-Partner: No    Emotionally Abused: No    Physically Abused: No    Sexually Abused: No    Past Surgical History:  Procedure Laterality Date   ABDOMINAL  HYSTERECTOMY     MOUTH SURGERY      Family History  Problem Relation Age of Onset   Ovarian cancer Mother    Diabetes Mother    Hypertension Mother    Hyperlipidemia Mother    Diabetes Father    Hypertension Father    Hyperlipidemia Father    Hypertension Brother    Hypertension Daughter    Asthma Daughter    Hypertension Daughter     Allergies  Allergen Reactions   Phenylpropanolamine Nausea And Vomiting and Other (See Comments)    "Tavist-D"   Prednisone Nausea Only and Other (See Comments)    Can tolerate via IV, but not orally   Codeine Nausea And Vomiting   Nicoderm [Nicotine] Rash and Other (See Comments)    Patient said she is ALLERGIC to the adhesive   Rocephin [Ceftriaxone] Rash and Other (See Comments)    06/11/22 got "redness on the neck and face" after an infusion of Rocephin   Tavist [Clemastine] Nausea And Vomiting    "Tavist-D"    Current Outpatient Medications on File Prior to Visit  Medication Sig Dispense Refill   Albuterol Sulfate (PROAIR RESPICLICK) 108 (90 Base) MCG/ACT AEPB Inhale 2 puffs into the lungs 4 (four) times daily as needed. 3 each 1   amLODipine (NORVASC) 10 MG tablet Take 1 tablet (10 mg total) by mouth daily. 90 tablet 2   aspirin EC 81 MG tablet Take 81 mg by mouth daily as needed for mild pain. Swallow whole.     B Complex-C (B-COMPLEX WITH VITAMIN C) tablet Take 1 tablet by mouth daily.     clonazePAM (KLONOPIN) 0.5 MG tablet Take 1 tablet (0.5 mg total) by mouth 2 (two) times daily as needed. for sleep and anxiety 60 tablet 2   fexofenadine (ALLEGRA) 180 MG tablet Take 1 tablet (180 mg total) by mouth daily. 90 tablet 1   FLUoxetine (PROZAC) 40 MG capsule Take 1 capsule (40 mg total) by mouth daily. (Patient taking differently: Take 20 mg by mouth daily.) 90 capsule 1   fluticasone-salmeterol (ADVAIR) 100-50 MCG/ACT AEPB Inhale 1 puff into the lungs 2 times daily. 60 each 6   glipiZIDE (GLUCOTROL XL) 5 MG 24 hr tablet TAKE 1 TABLET(5  MG) BY MOUTH DAILY WITH BREAKFAST 90 tablet 0   hydrochlorothiazide (HYDRODIURIL) 25 MG tablet TAKE 1 TABLET(25 MG) BY MOUTH DAILY 90 tablet 1   lisinopril (ZESTRIL) 40 MG tablet TAKE 1 TABLET(40 MG) BY MOUTH DAILY 90 tablet 3   metFORMIN (GLUCOPHAGE) 500 MG tablet TAKE 1 TABLET BY MOUTH TWICE DAILY WITH A MEAL 180 tablet 0  montelukast (SINGULAIR) 10 MG tablet TAKE 1 TABLET(10 MG) BY MOUTH AT BEDTIME 30 tablet 2   Multiple Vitamin (MULTIVITAMIN WITH MINERALS) TABS Take 1 tablet by mouth daily with breakfast.     naproxen (NAPROSYN) 500 MG tablet Take 500 mg by mouth 2 (two) times daily as needed for mild pain.     simvastatin (ZOCOR) 20 MG tablet Take 1 tablet (20 mg total) by mouth daily. 90 tablet 3   spironolactone (ALDACTONE) 25 MG tablet TAKE 1 TABLET(25 MG) BY MOUTH DAILY 90 tablet 1   sulfamethoxazole-trimethoprim (BACTRIM DS) 800-160 MG tablet Take 1 tablet by mouth 2 (two) times daily for 7 days. 14 tablet 0   triamcinolone cream (KENALOG) 0.1 % Apply 1 Application topically 2 (two) times daily. 30 g 0   TYLENOL 500 MG tablet Take 500-1,000 mg by mouth every 6 (six) hours as needed for mild pain or headache.     mometasone (NASONEX) 50 MCG/ACT nasal spray Place 2 sprays into the nose daily. 1 each 2   No current facility-administered medications on file prior to visit.    BP 120/60   Pulse 93   Temp 98.5 F (36.9 C) (Oral)   Ht 5' (1.524 m)   Wt 132 lb (59.9 kg)   SpO2 95%   BMI 25.78 kg/m       Objective:   Physical Exam Vitals and nursing note reviewed.  Constitutional:      Appearance: Normal appearance.  Cardiovascular:     Rate and Rhythm: Normal rate and regular rhythm.     Pulses: Normal pulses.     Heart sounds: Normal heart sounds.  Pulmonary:     Effort: Pulmonary effort is normal.     Breath sounds: Normal breath sounds.  Skin:    General: Skin is warm and dry.  Neurological:     General: No focal deficit present.     Mental Status: She is alert and  oriented to person, place, and time.  Psychiatric:        Mood and Affect: Mood normal.        Behavior: Behavior normal.        Thought Content: Thought content normal.        Judgment: Judgment normal.       Assessment & Plan:  1. History of UTI  - POC Urinalysis Dipstick- negative  - Advised to stay hydrated   2. Adjustment disorder with mixed anxiety and depressed mood  - clonazePAM (KLONOPIN) 0.5 MG tablet; Take 1 tablet (0.5 mg total) by mouth 2 (two) times daily as needed. for sleep and anxiety  Dispense: 60 tablet; Refill: 2  3. Insomnia, unspecified type  - clonazePAM (KLONOPIN) 0.5 MG tablet; Take 1 tablet (0.5 mg total) by mouth 2 (two) times daily as needed. for sleep and anxiety  Dispense: 60 tablet; Refill: 2  4. COPD with acute exacerbation (HCC)  - albuterol (VENTOLIN HFA) 108 (90 Base) MCG/ACT inhaler; Inhale 2 puffs into the lungs every 6 (six) hours as needed for wheezing or shortness of breath.  Dispense: 8 g; Refill: 2  Shirline Frees, NP

## 2023-04-10 NOTE — Patient Instructions (Signed)
It was great seeing you today. You do not have a UTI.   I have refilled the medications you requested.

## 2023-04-17 ENCOUNTER — Other Ambulatory Visit: Payer: Self-pay | Admitting: Adult Health

## 2023-04-24 ENCOUNTER — Other Ambulatory Visit: Payer: Self-pay | Admitting: Adult Health

## 2023-04-24 DIAGNOSIS — I10 Essential (primary) hypertension: Secondary | ICD-10-CM

## 2023-06-25 ENCOUNTER — Ambulatory Visit
Admission: RE | Admit: 2023-06-25 | Discharge: 2023-06-25 | Disposition: A | Discharge status: Home / Self Care | Payer: PPO | Source: Ambulatory Visit | Attending: Family Medicine | Admitting: Family Medicine

## 2023-06-25 ENCOUNTER — Other Ambulatory Visit: Payer: Self-pay

## 2023-06-25 VITALS — BP 175/77 | Temp 98.2°F | Resp 22

## 2023-06-25 DIAGNOSIS — N10 Acute tubulo-interstitial nephritis: Secondary | ICD-10-CM | POA: Diagnosis not present

## 2023-06-25 DIAGNOSIS — N390 Urinary tract infection, site not specified: Secondary | ICD-10-CM | POA: Diagnosis not present

## 2023-06-25 LAB — POCT URINALYSIS DIP (MANUAL ENTRY)
Bilirubin, UA: NEGATIVE
Glucose, UA: NEGATIVE mg/dL
Ketones, POC UA: NEGATIVE mg/dL
Nitrite, UA: NEGATIVE
Protein Ur, POC: >=300 mg/dL — AB
Spec Grav, UA: 1.025 (ref 1.010–1.025)
Urobilinogen, UA: 0.2 E.U./dL
pH, UA: 6 (ref 5.0–8.0)

## 2023-06-25 MED ORDER — ONDANSETRON HCL 4 MG PO TABS: 4.0000 mg | ORAL_TABLET | Freq: Four times a day (QID) | ORAL | 0 refills | Status: AC

## 2023-06-25 MED ORDER — CIPROFLOXACIN HCL 500 MG PO TABS: 500.0000 mg | ORAL_TABLET | Freq: Two times a day (BID) | ORAL | 0 refills | Status: DC

## 2023-06-25 MED ORDER — KETOROLAC TROMETHAMINE 30 MG/ML IJ SOLN
15.0000 mg | Freq: Once | INTRAMUSCULAR | Status: AC
  Administered 2023-06-25: 15 mg via INTRAMUSCULAR

## 2023-06-25 NOTE — ED Triage Notes (Signed)
Fever, chills, right flank pain, pain with urination, dry mouth. Pt noted to be shaking while walking to exam room.

## 2023-06-25 NOTE — Discharge Instructions (Signed)
Take the Cipro antibiotic 2 times a day.  Be sure to get 2 doses in today Give Tylenol or ibuprofen for pain and fever Make sure you increase your water intake I have prescribed Zofran to take as needed for nausea I have sent your urine to the laboratory for culture.  Will make sure you are on the correct antibiotic Make an appointment to follow-up with your primary care doctor later this week

## 2023-06-25 NOTE — ED Provider Notes (Signed)
[facility]    CSN: [id number] Arrival date & time: 06/25/23  1143      History   Chief Complaint Chief Complaint  Patient presents with   Urinary Frequency    Lower back pain. HX of UTI - Entered by patient    HPI Shelly Clark is a 77 y.o. female.   HPI  This is a pleasant 77 year old woman who is here with a urinary tract infection.  I have seen her previously in 2022 for acute pyelonephritis.  She states that she has recurring kidney and urinary infections.  She has even had problems with sepsis from urinary tract infections.  She is followed by her primary care doctor. Currently she has shaking chills, back pain, nausea, dry mouth.  She states she is drinking enough water.  She has not taken her temperature.  She has not taken any medicine for pain.  Past Medical History:  Diagnosis Date   Benign hematuria    neg urology workup   Diabetes mellitus without complication (HCC)    Esophageal dysmotility    Hyperlipidemia    Hypertension    Migraines    Nicotine dependence, cigarettes, uncomplicated 11/12/2020   Osteoporosis    Tobacco abuse     Patient Active Problem List   Diagnosis Date Noted   Hyponatremia 06/11/2022   Asymptomatic bacteriuria 06/11/2022   COPD with acute exacerbation (HCC) 06/11/2022   E coli bacteremia 11/13/2020   Hypokalemia    Wheezing on expiration    Dehydration with hyponatremia 11/12/2020   Mixed hyperlipidemia due to type 2 diabetes mellitus (HCC) 11/12/2020   GERD without esophagitis 11/12/2020   Nicotine dependence, cigarettes, uncomplicated 11/12/2020   Hypokalemia, inadequate intake 11/12/2020   E. coli UTI 11/12/2020   Sepsis due to gram-negative UTI (HCC) 11/11/2020   Dysphagia 02/24/2015   Adjustment disorder with mixed anxiety and depressed mood 02/10/2013   Hyperlipidemia 10/30/2007   TOBACCO ABUSE 10/30/2007   Essential hypertension 10/30/2007   UNSPECIFIED OSTEOPOROSIS 10/30/2007   Type 2 diabetes  mellitus, controlled (HCC) 10/30/2007    Past Surgical History:  Procedure Laterality Date   ABDOMINAL HYSTERECTOMY     MOUTH SURGERY      OB History   No obstetric history on file.      Home Medications    Prior to Admission medications   Medication Sig Start Date End Date Taking? Authorizing Provider  ciprofloxacin (CIPRO) 500 MG tablet Take 1 tablet (500 mg total) by mouth 2 (two) times daily. 06/25/23  Yes Eustace Moore, MD  ondansetron (ZOFRAN) 4 MG tablet Take 1 tablet (4 mg total) by mouth every 6 (six) hours. 06/25/23  Yes Eustace Moore, MD  albuterol (VENTOLIN HFA) 108 (90 Base) MCG/ACT inhaler Inhale 2 puffs into the lungs every 6 (six) hours as needed for wheezing or shortness of breath. 04/10/23   Nafziger, Kandee Keen, NP  amLODipine (NORVASC) 10 MG tablet TAKE 1 TABLET(10 MG) BY MOUTH DAILY 04/24/23   Nafziger, Kandee Keen, NP  B Complex-C (B-COMPLEX WITH VITAMIN C) tablet Take 1 tablet by mouth daily.    [provider]  clonazePAM (KLONOPIN) 0.5 MG tablet Take 1 tablet (0.5 mg total) by mouth 2 (two) times daily as needed. for sleep and anxiety 04/10/23   Nafziger, Kandee Keen, NP  fexofenadine (ALLEGRA) 180 MG tablet Take 1 tablet (180 mg total) by mouth daily. 12/09/22 06/07/23  Theadora Rama Scales, PA-C  FLUoxetine (PROZAC) 40 MG capsule TAKE 1 CAPSULE(40 MG) BY MOUTH DAILY  04/12/23   Nafziger, Kandee Keen, NP  fluticasone-salmeterol (ADVAIR) 100-50 MCG/ACT AEPB Inhale 1 puff into the lungs 2 times daily. 10/12/22   Nafziger, Kandee Keen, NP  glipiZIDE (GLUCOTROL XL) 5 MG 24 hr tablet TAKE 1 TABLET(5 MG) BY MOUTH DAILY WITH BREAKFAST 04/03/23   Nafziger, Kandee Keen, NP  lisinopril (ZESTRIL) 40 MG tablet TAKE 1 TABLET(40 MG) BY MOUTH DAILY 09/12/22   Nafziger, Kandee Keen, NP  metFORMIN (GLUCOPHAGE) 500 MG tablet TAKE 1 TABLET BY MOUTH TWICE DAILY WITH A MEAL 04/17/23   Nafziger, Kandee Keen, NP  mometasone (NASONEX) 50 MCG/ACT nasal spray Place 2 sprays into the nose daily. 12/09/22 03/09/23  Theadora Rama  Scales, PA-C  montelukast (SINGULAIR) 10 MG tablet TAKE 1 TABLET(10 MG) BY MOUTH AT BEDTIME 03/09/23   Nafziger, Kandee Keen, NP  Multiple Vitamin (MULTIVITAMIN WITH MINERALS) TABS Take 1 tablet by mouth daily with breakfast.    [provider]  naproxen (NAPROSYN) 500 MG tablet Take 500 mg by mouth 2 (two) times daily as needed for mild pain.    [provider]  simvastatin (ZOCOR) 20 MG tablet Take 1 tablet (20 mg total) by mouth daily. 07/11/22   Nafziger, Kandee Keen, NP  spironolactone (ALDACTONE) 25 MG tablet TAKE 1 TABLET(25 MG) BY MOUTH DAILY 04/12/23   Nafziger, Kandee Keen, NP  triamcinolone cream (KENALOG) 0.1 % Apply 1 Application topically 2 (two) times daily. 10/12/22   Nafziger, Kandee Keen, NP  TYLENOL 500 MG tablet Take 500-1,000 mg by mouth every 6 (six) hours as needed for mild pain or headache.    [provider]    Family History Family History  Problem Relation Age of Onset   Ovarian cancer Mother    Diabetes Mother    Hypertension Mother    Hyperlipidemia Mother    Diabetes Father    Hypertension Father    Hyperlipidemia Father    Hypertension Brother    Hypertension Daughter    Asthma Daughter    Hypertension Daughter     Social History Social History   Tobacco Use   Smoking status: Every Day    Current packs/day: 2.00    Average packs/day: 2.0 packs/day for 40.0 years (80.0 ttl pk-yrs)    Types: Cigarettes   Smokeless tobacco: Never   Tobacco comments:    1ppd  Vaping Use   Vaping status: Never Used  Substance Use Topics   Alcohol use: No   Drug use: No     Allergies   Phenylpropanolamine, Prednisone, Codeine, Nicoderm [nicotine], Rocephin [ceftriaxone], and Tavist [clemastine]   Review of Systems Review of Systems See HPI  Physical Exam Triage Vital Signs ED Triage Vitals  Encounter Vitals Group     BP 06/25/23 1153 (!) 175/77     Systolic BP Percentile --      Diastolic BP Percentile --      Pulse --      Resp 06/25/23 1153 (!) 22      Temp 06/25/23 1153 98.2 F (36.8 C)     Temp Source 06/25/23 1153 Oral     SpO2 06/25/23 1153 96 %     Weight --      Height --      Head Circumference --      Peak Flow --      Pain Score 06/25/23 1156 5     Pain Loc --      Pain Education --      Exclude from Growth Chart --    No data found.  Updated Vital  Signs BP (!) 175/77 (BP Location: Right Arm)   Temp 98.2 F (36.8 C) (Oral)   Resp (!) 22   SpO2 96%   Visual Acuity Right Eye Distance:   Left Eye Distance:   Bilateral Distance:    Right Eye Near:   Left Eye Near:    Bilateral Near:     Physical Exam Constitutional:      General: She is not in acute distress.    Appearance: She is well-developed and normal weight. She is ill-appearing.  HENT:     Head: Normocephalic and atraumatic.     Mouth/Throat:     Mouth: Mucous membranes are dry.     Pharynx: No posterior oropharyngeal erythema.  Eyes:     Conjunctiva/sclera: Conjunctivae normal.     Pupils: Pupils are equal, round, and reactive to light.  Cardiovascular:     Rate and Rhythm: Normal rate and regular rhythm.     Heart sounds: Normal heart sounds.  Pulmonary:     Effort: Pulmonary effort is normal. No respiratory distress.     Breath sounds: Normal breath sounds.  Abdominal:     General: There is no distension.     Palpations: Abdomen is soft.     Tenderness: There is abdominal tenderness. There is right CVA tenderness.  Musculoskeletal:        General: Normal range of motion.     Cervical back: Normal range of motion.  Skin:    General: Skin is warm and dry.  Neurological:     Mental Status: She is alert.      UC Treatments / Results  Labs (all labs ordered are listed, but only abnormal results are displayed) Labs Reviewed  POCT URINALYSIS DIP (MANUAL ENTRY) - Abnormal; Notable for the following components:      Result Value   Clarity, UA cloudy (*)    Blood, UA moderate (*)    Protein Ur, POC >=300 (*)    Leukocytes, UA Large  (3+) (*)    All other components within normal limits  URINE CULTURE    EKG   Radiology No results found.  Procedures Procedures (including critical care time)  Medications Ordered in UC Medications  ketorolac (TORADOL) 30 MG/ML injection 15 mg (15 mg Intramuscular Given 06/25/23 1237)    Initial Impression / Assessment and Plan / UC Course  I have reviewed the triage vital signs and the nursing notes.  Pertinent labs & imaging results that were available during my care of the patient were reviewed by me and considered in my medical decision making (see chart for details).     Will send urine for culture. Considered a gram of Rocephin, however patient has a new allergy listed to Rocephin in 2023 Informed patient and her daughter that if she gets worse at any time at home, she must go to an emergency room Final Clinical Impressions(s) / UC Diagnoses   Final diagnoses:  Acute pyelonephritis  Recurrent urinary tract infection     Discharge Instructions      Take the Cipro antibiotic 2 times a day.  Be sure to get 2 doses in today Give Tylenol or ibuprofen for pain and fever Make sure you increase your water intake I have prescribed Zofran to take as needed for nausea I have sent your urine to the laboratory for culture.  Will make sure you are on the correct antibiotic Make an appointment to follow-up with your primary care doctor later this week   ED  Prescriptions     Medication Sig Dispense Auth. Provider   ciprofloxacin (CIPRO) 500 MG tablet Take 1 tablet (500 mg total) by mouth 2 (two) times daily. 14 tablet Eustace Moore, MD   ondansetron (ZOFRAN) 4 MG tablet Take 1 tablet (4 mg total) by mouth every 6 (six) hours. 12 tablet Eustace Moore, MD      PDMP not reviewed this encounter.   Eustace Moore, MD 06/25/23 1257

## 2023-06-26 ENCOUNTER — Telehealth: Payer: Self-pay

## 2023-07-03 ENCOUNTER — Encounter: Payer: Self-pay | Admitting: Adult Health

## 2023-07-03 ENCOUNTER — Ambulatory Visit (INDEPENDENT_AMBULATORY_CARE_PROVIDER_SITE_OTHER): Payer: PPO | Admitting: Adult Health

## 2023-07-03 VITALS — BP 120/60 | HR 74 | Temp 98.1°F | Ht 60.0 in | Wt 135.0 lb

## 2023-07-03 DIAGNOSIS — E119 Type 2 diabetes mellitus without complications: Secondary | ICD-10-CM | POA: Diagnosis not present

## 2023-07-03 DIAGNOSIS — G4700 Insomnia, unspecified: Secondary | ICD-10-CM | POA: Diagnosis not present

## 2023-07-03 DIAGNOSIS — R062 Wheezing: Secondary | ICD-10-CM | POA: Diagnosis not present

## 2023-07-03 DIAGNOSIS — F4323 Adjustment disorder with mixed anxiety and depressed mood: Secondary | ICD-10-CM | POA: Diagnosis not present

## 2023-07-03 DIAGNOSIS — N390 Urinary tract infection, site not specified: Secondary | ICD-10-CM | POA: Diagnosis not present

## 2023-07-03 MED ORDER — CLONAZEPAM 0.5 MG PO TABS: 0.5000 mg | ORAL_TABLET | Freq: Two times a day (BID) | ORAL | 2 refills | Status: DC | PRN

## 2023-07-03 MED ORDER — METFORMIN HCL 500 MG PO TABS: 500.0000 mg | ORAL_TABLET | Freq: Two times a day (BID) | ORAL | 1 refills | Status: DC

## 2023-07-03 MED ORDER — MONTELUKAST SODIUM 10 MG PO TABS: 10.0000 mg | ORAL_TABLET | Freq: Every day | ORAL | 3 refills | Status: DC

## 2023-07-03 MED ORDER — GLIPIZIDE ER 5 MG PO TB24: 5.0000 mg | ORAL_TABLET | Freq: Every day | ORAL | 1 refills | Status: DC

## 2023-07-03 NOTE — Progress Notes (Signed)
Subjective:    Patient ID: Shelly Clark, female    DOB: 1946-03-02, 77 y.o.   MRN: [id number]  HPI 77 year old female who  has a past medical history of Benign hematuria, Diabetes mellitus without complication (HCC), Esophageal dysmotility, Hyperlipidemia, Hypertension, Migraines, Nicotine dependence, cigarettes, uncomplicated (11/12/2020), Osteoporosis, and Tobacco abuse.  She was seen at urgent care 8 days ago for concern of a UTI.  She has a history of urinary tract infections and has had sepsis from UTI.  When she was seen at urgent care her symptoms include shaking chills, back pain, nausea, dry mouth.  She was drinking plenty of water.  She had not taken any medication for pain  On exam she appeared ill with abdominal tenderness and right CVA tenderness.  Her urine culture was indicative of a UTI.  She was prescribed Cipro 500 mg twice daily x 7 days until urine culture resulted  Her urine culture showed greater than 100,000 colonies per mL of E. coli bacteria.  She has finished the abx therapy and is feeling much better. She would like to be referred to Urology to help her find a cause of recurrent UTIs.   She is also due for follow up regarding DM   Diabetes mellitus type 2-managed with glipizide 5 mg daily and metformin 500 mg twice daily.  She does not check her blood sugars at home.  She denies any episodes of hypoglycemia.  Lab Results  Component Value Date   HGBA1C 6.2 (A) 10/12/2022   She also needs a refill of klonopin and Singulair    Review of Systems See HPI   Past Medical History:  Diagnosis Date   Benign hematuria    neg urology workup   Diabetes mellitus without complication (HCC)    Esophageal dysmotility    Hyperlipidemia    Hypertension    Migraines    Nicotine dependence, cigarettes, uncomplicated 11/12/2020   Osteoporosis    Tobacco abuse     Social History   Socioeconomic History   Marital status: Married    Spouse name: Not on file    Number of children: Not on file   Years of education: Not on file   Highest education level: Not on file  Occupational History   Occupation: OWNER    Employer: [employer]    Comment: Restaurant  Tobacco Use   Smoking status: Every Day    Current packs/day: 2.00    Average packs/day: 2.0 packs/day for 40.0 years (80.0 ttl pk-yrs)    Types: Cigarettes   Smokeless tobacco: Never   Tobacco comments:    1ppd  Vaping Use   Vaping status: Never Used  Substance and Sexual Activity   Alcohol use: No   Drug use: No   Sexual activity: Not on file  Other Topics Concern   Not on file  Social History Narrative   ** Merged History Encounter **       Owns a home  She is married  Two children      Social Determinants of Health   Financial Resource Strain: Low Risk  (08/02/2022)   Overall Financial Resource Strain (CARDIA)    Difficulty of Paying Living Expenses: Not hard at all  Food Insecurity: No Food Insecurity (08/02/2022)   Hunger Vital Sign    Worried About Running Out of Food in the Last Year: Never true    Ran Out of Food in the Last Year: Never true  Transportation Needs: No Transportation Needs (08/02/2022)  PRAPARE - Administrator, Civil Service (Medical): No    Lack of Transportation (Non-Medical): No  Physical Activity: Inactive (08/02/2022)   Exercise Vital Sign    Days of Exercise per Week: 0 days    Minutes of Exercise per Session: 0 min  Stress: No Stress Concern Present (08/02/2022)   [employer] of Occupational Health - Occupational Stress Questionnaire    Feeling of Stress : Not at all  Social Connections: Moderately Isolated (08/02/2022)   Social Connection and Isolation Panel [NHANES]    Frequency of Communication with Friends and Family: More than three times a week    Frequency of Social Gatherings with Friends and Family: More than three times a week    Attends Religious Services: Never    Database administrator or Organizations: No    Attends  Banker Meetings: Never    Marital Status: Married  Catering manager Violence: Not At Risk (08/02/2022)   Humiliation, Afraid, Rape, and Kick questionnaire    Fear of Current or Ex-Partner: No    Emotionally Abused: No    Physically Abused: No    Sexually Abused: No    Past Surgical History:  Procedure Laterality Date   ABDOMINAL HYSTERECTOMY     MOUTH SURGERY      Family History  Problem Relation Age of Onset   Ovarian cancer Mother    Diabetes Mother    Hypertension Mother    Hyperlipidemia Mother    Diabetes Father    Hypertension Father    Hyperlipidemia Father    Hypertension Brother    Hypertension Daughter    Asthma Daughter    Hypertension Daughter     Allergies  Allergen Reactions   Phenylpropanolamine Nausea And Vomiting and Other (See Comments)    "Tavist-D"   Prednisone Nausea Only and Other (See Comments)    Can tolerate via IV, but not orally   Codeine Nausea And Vomiting   Nicoderm [Nicotine] Rash and Other (See Comments)    Patient said she is ALLERGIC to the adhesive   Rocephin [Ceftriaxone] Rash and Other (See Comments)    06/11/22 got "redness on the neck and face" after an infusion of Rocephin   Tavist [Clemastine] Nausea And Vomiting    "Tavist-D"    Current Outpatient Medications on File Prior to Visit  Medication Sig Dispense Refill   albuterol (VENTOLIN HFA) 108 (90 Base) MCG/ACT inhaler Inhale 2 puffs into the lungs every 6 (six) hours as needed for wheezing or shortness of breath. 8 g 2   amLODipine (NORVASC) 10 MG tablet TAKE 1 TABLET(10 MG) BY MOUTH DAILY 90 tablet 2   B Complex-C (B-COMPLEX WITH VITAMIN C) tablet Take 1 tablet by mouth daily.     fexofenadine (ALLEGRA) 180 MG tablet Take 1 tablet (180 mg total) by mouth daily. 90 tablet 1   FLUoxetine (PROZAC) 40 MG capsule TAKE 1 CAPSULE(40 MG) BY MOUTH DAILY 90 capsule 1   fluticasone-salmeterol (ADVAIR) 100-50 MCG/ACT AEPB Inhale 1 puff into the lungs 2 times daily. 60  each 6   glipiZIDE (GLUCOTROL XL) 5 MG 24 hr tablet TAKE 1 TABLET(5 MG) BY MOUTH DAILY WITH BREAKFAST 90 tablet 0   lisinopril (ZESTRIL) 40 MG tablet TAKE 1 TABLET(40 MG) BY MOUTH DAILY 90 tablet 3   mometasone (NASONEX) 50 MCG/ACT nasal spray Place 2 sprays into the nose daily. 1 each 2   Multiple Vitamin (MULTIVITAMIN WITH MINERALS) TABS Take 1 tablet by mouth daily with  breakfast.     naproxen (NAPROSYN) 500 MG tablet Take 500 mg by mouth 2 (two) times daily as needed for mild pain.     ondansetron (ZOFRAN) 4 MG tablet Take 1 tablet (4 mg total) by mouth every 6 (six) hours. 12 tablet 0   simvastatin (ZOCOR) 20 MG tablet Take 1 tablet (20 mg total) by mouth daily. 90 tablet 3   spironolactone (ALDACTONE) 25 MG tablet TAKE 1 TABLET(25 MG) BY MOUTH DAILY 90 tablet 1   triamcinolone cream (KENALOG) 0.1 % Apply 1 Application topically 2 (two) times daily. 30 g 0   TYLENOL 500 MG tablet Take 500-1,000 mg by mouth every 6 (six) hours as needed for mild pain or headache.     No current facility-administered medications on file prior to visit.    BP 120/60   Pulse 74   Temp 98.1 F (36.7 C) (Oral)   Ht 5' (1.524 m)   Wt 135 lb (61.2 kg)   SpO2 97%   BMI 26.37 kg/m       Objective:   Physical Exam Vitals and nursing note reviewed.  Constitutional:      Appearance: Normal appearance.  HENT:     Mouth/Throat:     Mouth: Mucous membranes are moist.     Pharynx: Oropharynx is clear.  Cardiovascular:     Rate and Rhythm: Normal rate and regular rhythm.     Pulses: Normal pulses.     Heart sounds: Normal heart sounds.  Pulmonary:     Effort: Pulmonary effort is normal.  Abdominal:     General: Abdomen is flat. Bowel sounds are normal.     Palpations: Abdomen is soft.  Skin:    General: Skin is warm and dry.  Neurological:     General: No focal deficit present.     Mental Status: She is alert and oriented to person, place, and time.  Psychiatric:        Mood and Affect: Mood  normal.        Behavior: Behavior normal.        Thought Content: Thought content normal.        Judgment: Judgment normal.       Assessment & Plan:   1. Recurrent UTI  - Ambulatory referral to Urogynecology - Basic Metabolic Panel; Future - Basic Metabolic Panel  2. Controlled type 2 diabetes mellitus without complication, without long-term current use of insulin (HCC) - Consider increase in glipizide  - Hemoglobin A1c; Future - Basic Metabolic Panel; Future - Microalbumin/Creatinine Ratio, Urine; Future - metFORMIN (GLUCOPHAGE) 500 MG tablet; Take 1 tablet (500 mg total) by mouth 2 (two) times daily with a meal.  Dispense: 180 tablet; Refill: 1 - Microalbumin/Creatinine Ratio, Urine - Basic Metabolic Panel - Hemoglobin A1c - glipiZIDE (GLUCOTROL XL) 5 MG 24 hr tablet; Take 1 tablet (5 mg total) by mouth daily with breakfast.  Dispense: 90 tablet; Refill: 1  3. Adjustment disorder with mixed anxiety and depressed mood  - clonazePAM (KLONOPIN) 0.5 MG tablet; Take 1 tablet (0.5 mg total) by mouth 2 (two) times daily as needed. for sleep and anxiety  Dispense: 60 tablet; Refill: 2  4. Insomnia, unspecified type  - clonazePAM (KLONOPIN) 0.5 MG tablet; Take 1 tablet (0.5 mg total) by mouth 2 (two) times daily as needed. for sleep and anxiety  Dispense: 60 tablet; Refill: 2  5. Wheezing on expiration  - montelukast (SINGULAIR) 10 MG tablet; Take 1 tablet (10 mg total) by mouth  at bedtime.  Dispense: 90 tablet; Refill: 3  Shirline Frees, NP

## 2023-07-05 ENCOUNTER — Telehealth: Payer: Self-pay | Admitting: Adult Health

## 2023-07-05 NOTE — Telephone Encounter (Signed)
Updated patient on her labs  

## 2023-07-12 ENCOUNTER — Encounter (INDEPENDENT_AMBULATORY_CARE_PROVIDER_SITE_OTHER): Payer: Self-pay

## 2023-07-24 ENCOUNTER — Encounter: Payer: Self-pay | Admitting: *Deleted

## 2023-08-07 ENCOUNTER — Encounter (INDEPENDENT_AMBULATORY_CARE_PROVIDER_SITE_OTHER): Payer: PPO | Admitting: Family Medicine

## 2023-08-07 NOTE — Progress Notes (Signed)
error 

## 2023-08-14 ENCOUNTER — Other Ambulatory Visit: Payer: Self-pay | Admitting: Adult Health

## 2023-08-14 DIAGNOSIS — E785 Hyperlipidemia, unspecified: Secondary | ICD-10-CM

## 2023-10-11 ENCOUNTER — Encounter: Payer: Self-pay | Admitting: Adult Health

## 2023-10-12 ENCOUNTER — Other Ambulatory Visit: Payer: Self-pay

## 2023-10-12 DIAGNOSIS — E119 Type 2 diabetes mellitus without complications: Secondary | ICD-10-CM

## 2023-10-12 MED ORDER — GLIPIZIDE ER 5 MG PO TB24: 5.0000 mg | ORAL_TABLET | Freq: Every day | ORAL | 1 refills | Status: DC

## 2023-11-05 ENCOUNTER — Other Ambulatory Visit: Payer: Self-pay | Admitting: Adult Health

## 2023-11-05 DIAGNOSIS — I10 Essential (primary) hypertension: Secondary | ICD-10-CM

## 2023-11-26 ENCOUNTER — Ambulatory Visit: Payer: PPO | Admitting: Obstetrics

## 2023-11-26 ENCOUNTER — Encounter: Payer: Self-pay | Admitting: Obstetrics

## 2023-11-26 VITALS — BP 130/76 | HR 80 | Ht 60.0 in | Wt 131.0 lb

## 2023-11-26 DIAGNOSIS — R159 Full incontinence of feces: Secondary | ICD-10-CM | POA: Diagnosis not present

## 2023-11-26 DIAGNOSIS — N390 Urinary tract infection, site not specified: Secondary | ICD-10-CM

## 2023-11-26 DIAGNOSIS — Z87440 Personal history of urinary (tract) infections: Secondary | ICD-10-CM | POA: Diagnosis not present

## 2023-11-26 DIAGNOSIS — F17200 Nicotine dependence, unspecified, uncomplicated: Secondary | ICD-10-CM | POA: Diagnosis not present

## 2023-11-26 DIAGNOSIS — R351 Nocturia: Secondary | ICD-10-CM | POA: Diagnosis not present

## 2023-11-26 DIAGNOSIS — E119 Type 2 diabetes mellitus without complications: Secondary | ICD-10-CM

## 2023-11-26 DIAGNOSIS — N393 Stress incontinence (female) (male): Secondary | ICD-10-CM | POA: Insufficient documentation

## 2023-11-26 LAB — POCT URINALYSIS DIPSTICK
Bilirubin, UA: NEGATIVE
Blood, UA: NEGATIVE
Glucose, UA: NEGATIVE
Leukocytes, UA: NEGATIVE
Nitrite, UA: NEGATIVE
Protein, UA: POSITIVE — AB
Spec Grav, UA: 1.02 (ref 1.010–1.025)
Urobilinogen, UA: 0.2 U/dL
pH, UA: 7 (ref 5.0–8.0)

## 2023-11-26 MED ORDER — ESTRADIOL 0.1 MG/GM VA CREA: 0.5000 g | TOPICAL_CREAM | VAGINAL | 3 refills | Status: AC

## 2023-11-26 NOTE — Assessment & Plan Note (Signed)
-   avoid fluid intake after 6pm - denies lower extremity swelling - patient reports snoring, consider sleep study to r/o sleep apnea

## 2023-11-26 NOTE — Assessment & Plan Note (Signed)
-   For treatment of stress urinary incontinence,  non-surgical options include expectant management, weight loss, physical therapy, as well as a pessary.  Surgical options include a midurethral sling, Burch urethropexy, and transurethral injection of a bulking agent. - start low dose vaginal estrogen

## 2023-11-26 NOTE — Progress Notes (Signed)
New Patient Evaluation and Consultation  Referring Provider: Shirline Frees, NP PCP: Shirline Frees, NP Date of Service: 11/26/2023  SUBJECTIVE Chief Complaint: New Patient (Initial Visit) (Shelly Clark is a 77 y.o. female here today for Recurrent UTI.)  History of Present Illness: Shelly Clark is a 77 y.o. Asian female seen in consultation at the request of NP Nafziger for evaluation of recurrent UTI.    Reports fatigue, denies urinary symptoms at diagnosis of UTI.  Denies vaginal estrogen use.   Recent urine testing: - 06/25/23 Urgent care for R CVA tenderness. UA + leuk/heme. Culture > 100K pansensitive E. Coli Rx Cipro - 04/10/23 UA negative, no sx - 04/04/23 office visit for suprapubic pain, decreased urinary output, urinary frequency, right flank pain UA + leuk/nit/heme. Culture > 100K pansensitive E. Coli Rx Bactrim. - 01/15/23 ED visit for COPD exacerbation. UA +heme. Culture 20K Proteus Mirabilis resistant to macrobid. Rx Azithro. Rx Bactrim after culture results - Cr 1.05 on 07/03/23  - presented to ED 06/10/22 after [facility] went out with SOB, diagnosed with COPD exacerbation and treated with DuoNebs. Rx albuterol and prednisone. - returned to ED on 06/11/22 with N/V, diffused abdominal pain, SOB, wheezing and cough. UA + nit/protein/heme/glucose, culture > 100K E. Coli resistant Ampicillin, ciprofloxacin, and intermediate to augmentin. Hyponatremia with elevated glucose 200s after prednisone, tachypnea, and HTN. Hypertonic saline started with resolution of tremor, confusion, leg cramps and malaise. Admitted for observation and treated with IV Rocephin initially for presumed UTI switched to Levaquin to cover possible UTI and COPD exacerbation due to redness on face and neck. However noted to be asymptomatic bacteruria due to lack of urinary symptoms.  - 11/11/20 ED to admission to hospitalization for E. Coli sepsis with RLQ abdominal pain x 1 month, N/V, dysuria, and fever 104.5. Started  IV ceftriaxone. Blood and urine culture positive for E. Coli resistant to ampicillin and gentamicin, intermediate to augmentin. CT abd/pelvis w/ contrast 11/11/20 "IMPRESSION: 1. Negative for acute appendicitis. 2. Urothelial enhancement of the right renal pelvis and diffusely involving the right ureter, suspicious for ascending urinary tract infection. Recommend correlation with urinalysis. 3. Sigmoid colon diverticular disease without acute inflammatory process.   Aortic Atherosclerosis (ICD10-I70.0).   Electronically Signed   By: Jasmine Pang M.D.   On: 11/11/2020 22:58"  Review of records significant for: T2DM with HgA1C 6.5 on 07/03/23, dysphagia, migraine, tobacco use 1 ppd  Urinary Symptoms: Leaks urine with cough/ sneeze, laughing, lifting, going from sitting to standing, with a full bladder, with movement to the bathroom, and with urgency started in 2021 Leaks 1-2 time(s) per days with cough Reports 4-5 pad use/day Patient is bothered by UI symptoms.  Day time voids 8.  Nocturia: 2 times per night to void Voiding dysfunction:  empties bladder well.  Patient does not use a catheter to empty bladder.  When urinating, patient feels dribbling after finishing and the need to urinate multiple times in a row Drinks: 48-60oz water per day, 2 cups of coffee, green tea  UTIs: 5 UTI's in the last year.   Reports history of microscopic blood in urine, possible sepsis due to E. Coli UTI No results found for the last 90 days.   Pelvic Organ Prolapse Symptoms:                  Patient Denies a feeling of a bulge the vaginal area.   Bowel Symptom: Bowel movements: 1 time(s) per day Stool consistency: soft  Straining: no.  Splinting: no.  Incomplete evacuation: no.  Patient Admits to accidental bowel leakage / fecal incontinence  Occurs: 1 time(s) per year (2x in her lifetime) when had [place] food  Consistency with leakage: soft  Bowel regimen: none Last colonoscopy:  Results transverse and sigmoid colon with tubular adenoma [facility] Colonoscopy          Upcoming     Colonoscopy (Every 10 Years) Next due on 09/02/2026    09/02/2016  Done - report scanned to chart   Only the first 1 history entries have been loaded, but more history exists.               Sexual Function Sexually active: yes.  Sexual orientation: Straight Pain with sex: No  Pelvic Pain Denies pelvic pain  Past Medical History:  Past Medical History:  Diagnosis Date   Benign hematuria    neg urology workup   Diabetes mellitus without complication (HCC)    Esophageal dysmotility    Hyperlipidemia    Hypertension    Migraines    Nicotine dependence, cigarettes, uncomplicated 11/12/2020   Osteoporosis    Tobacco abuse      Past Surgical History:   Past Surgical History:  Procedure Laterality Date   MOUTH SURGERY     TUBAL LIGATION  1979     Past OB/GYN History: OB History  No obstetric history on file.    Vaginal deliveries: 3, largest 5lb Forceps/ Vacuum deliveries: 0, Cesarean section: 0 Menopausal: Yes, at age 78, Denies vaginal bleeding since menopause Contraception: s/p hysterectomy. Last pap smear unknown timing.  Any history of abnormal pap smears: no. No results found for: "DIAGPAP", "HPVHIGH", "ADEQPAP"  Medications: Patient has a current medication list which includes the following prescription(s): albuterol, amlodipine, b-complex with vitamin c, clonazepam, [START ON 11/29/2023] estradiol, fluoxetine, fluticasone-salmeterol, glipizide, lisinopril, metformin, montelukast, multivitamin with minerals, naproxen, ondansetron, simvastatin, spironolactone, triamcinolone cream, tylenol, and fexofenadine.   Allergies: Patient is allergic to phenylpropanolamine, prednisone, codeine, nicoderm [nicotine], rocephin [ceftriaxone], and tavist [clemastine].   Social History:  Social History   Tobacco Use   Smoking status: Every Day    Current packs/day: 2.00     Average packs/day: 2.0 packs/day for 40.0 years (80.0 ttl pk-yrs)    Types: Cigarettes   Smokeless tobacco: Never   Tobacco comments:    1ppd  Vaping Use   Vaping status: Never Used  Substance Use Topics   Alcohol use: No   Drug use: No    Relationship status: married Patient lives with her spouse.   Patient is working at her [employer]. Regular exercise: Yes: gardening and walking History of abuse: No  Family History:   Family History  Problem Relation Age of Onset   Ovarian cancer Mother    Diabetes Mother    Hypertension Mother    Hyperlipidemia Mother    Diabetes Father    Hypertension Father    Hyperlipidemia Father    Hypertension Brother    Renal cancer Brother    Hypertension Daughter    Asthma Daughter    Hypertension Daughter    Uterine cancer Neg Hx    Bladder Cancer Neg Hx      Review of Systems: Review of Systems  Constitutional:  Negative for fever, malaise/fatigue and weight loss.       Weight gain  Respiratory:  Positive for wheezing. Negative for cough and shortness of breath.   Cardiovascular:  Negative for chest pain, palpitations and leg swelling.  Gastrointestinal:  Negative  for abdominal pain and blood in stool.       Leakage  Genitourinary:  Positive for dysuria, frequency and urgency. Negative for hematuria.       Leakage  Skin:  Negative for rash.  Neurological:  Negative for dizziness, weakness and headaches.  Endo/Heme/Allergies:  Does not bruise/bleed easily.  Psychiatric/Behavioral:  Negative for depression. The patient is nervous/anxious.      OBJECTIVE Physical Exam: Vitals:   11/26/23 1401  BP: 130/76  Pulse: 80  Weight: 131 lb (59.4 kg)  Height: 5' (1.524 m)    Physical Exam Constitutional:      General: She is not in acute distress.    Appearance: Normal appearance.  Genitourinary:     Bladder and urethral meatus normal.     No lesions in the vagina.     Genitourinary Comments: Vaginal atrophy noted with pale  epithelium and loss of rugation      Right Labia: No rash, tenderness, lesions, skin changes or Bartholin's cyst.    Left Labia: No tenderness, lesions, skin changes, Bartholin's cyst or rash.    No vaginal discharge, erythema, tenderness, bleeding, ulceration or granulation tissue.      Right Adnexa: not tender, not full and no mass present.    Left Adnexa: not tender, not full and no mass present.    Cervix is absent.     Uterus is absent.     Urethral meatus caruncle not present.    Urethral stress urinary incontinence with cough stress test present.     No urethral prolapse, tenderness, mass, hypermobility or discharge present.     Bladder is not tender, urgency on palpation not present and masses not present.      Levator ani not tender, obturator internus not tender, no asymmetrical contractions present and no pelvic spasms present.    Anal wink present and BC reflex present. Cardiovascular:     Rate and Rhythm: Normal rate.  Pulmonary:     Effort: Pulmonary effort is normal. No respiratory distress.  Abdominal:     General: Abdomen is flat. There is no distension.     Palpations: There is no mass.     Tenderness: There is no abdominal tenderness. There is no right CVA tenderness or left CVA tenderness.     Hernia: No hernia is present.    Neurological:     Mental Status: She is alert.  Vitals reviewed. Exam conducted with a chaperone present.      POP-Q:   POP-Q  -2                                            Aa   -2                                           Ba  -6                                              C   1  Gh  3                                            Pb  7                                            tvl   -2                                            Ap  -2                                            Bp  -6                                              D   Post-Void Residual (PVR) by Bladder Scan: In  order to evaluate bladder emptying, we discussed obtaining a postvoid residual and patient agreed to this procedure.  Procedure: The ultrasound unit was placed on the patient's abdomen in the suprapubic region after the patient had voided.    Post Void Residual - 11/26/23 1434       Post Void Residual   Post Void Residual 12 mL              Laboratory Results: Lab Results  Component Value Date   COLORU yellow 11/26/2023   CLARITYU clear 11/26/2023   GLUCOSEUR Negative 11/26/2023   BILIRUBINUR negative 11/26/2023   KETONESU neagtive 11/26/2023   SPECGRAV 1.020 11/26/2023   RBCUR negative 11/26/2023   PHUR 7.0 11/26/2023   PROTEINUR Positive (A) 11/26/2023   UROBILINOGEN 0.2 11/26/2023   LEUKOCYTESUR Negative 11/26/2023    Lab Results  Component Value Date   CREATININE 1.05 07/03/2023   CREATININE 0.84 07/09/2022   CREATININE 0.59 06/20/2022    Lab Results  Component Value Date   HGBA1C 6.5 07/03/2023    Lab Results  Component Value Date   HGB 14.4 07/09/2022     ASSESSMENT AND PLAN Ms. Pliler is a 77 y.o. with:  1. Recurrent UTI   2. Controlled type 2 diabetes mellitus without complication, without long-term current use of insulin (HCC)   3. Incontinence of feces, unspecified fecal incontinence type   4. SUI (stress urinary incontinence, female)   5. Nocturia   6. TOBACCO ABUSE     Recurrent UTI Assessment & Plan: - POCT UA + protein - 06/25/23 Urgent care for R CVA tenderness. UA + leuk/heme. Culture > 100K pansensitive E. Coli Rx Cipro - 04/04/23 office visit for suprapubic pain, decreased UOP, urinary frequency, R flank pain UA + leuk/nit/heme. Culture > 100K pansensitive E. Coli Rx Bactrim. - prior hospitalization 05/2022 due to hyponatremia and COPD exacerbation with asymptomatic bacteruria.  - 2021 hospitalization for E. Coli urosepsis with CT showing urothelial enhancement of the right renal pelvis and diffusely involving the right ureter,  suspicious for ascending urinary tract infection. Consider  repeat imaging. - For treatment of recurrent urinary tract infections, we discussed management of recurrent UTIs including prophylaxis with a daily low dose antibiotic, transvaginal estrogen therapy, D-mannose, and cranberry supplements.  We discussed the role of diagnostic testing such as cystoscopy and upper tract imaging.   - Rx low dose vaginal estrogen with estradiol. We discussed the potential risks associated with hormone replacement including stroke, heart attack, and blood clots; and the fact that these risks are very low with vaginal estrogen use due to the very low systemic absorption rate of ~ 0.01% with a twice-week regimen. - pt to call office for urine testing if patient experiences change in urinary symptoms.   Orders: -     Estradiol; Place 0.5 g vaginally 2 (two) times a week. Place 0.5g nightly for two weeks then twice a week after  Dispense: 30 g; Refill: 3  Controlled type 2 diabetes mellitus without complication, without long-term current use of insulin (HCC) Assessment & Plan: - well controlled with HbA1C 6.5 on 07/03/23 - discussed association of glycemic control with urinary leakage   Incontinence of feces, unspecified fecal incontinence type Assessment & Plan: - attributed to dietary triggers, 2 episodes in her lifetime - Treatment options include anti-diarrhea medication (loperamide/ Imodium OTC or prescription lomotil), fiber supplements, physical therapy, and possible sacral neuromodulation or surgery.      SUI (stress urinary incontinence, female) Assessment & Plan: - For treatment of stress urinary incontinence,  non-surgical options include expectant management, weight loss, physical therapy, as well as a pessary.  Surgical options include a midurethral sling, Burch urethropexy, and transurethral injection of a bulking agent. - start low dose vaginal estrogen  Orders: -     Estradiol; Place 0.5 g  vaginally 2 (two) times a week. Place 0.5g nightly for two weeks then twice a week after  Dispense: 30 g; Refill: 3  Nocturia Assessment & Plan: - avoid fluid intake after 6pm - denies lower extremity swelling - patient reports snoring, consider sleep study to r/o sleep apnea   Orders: -     POCT urinalysis dipstick  TOBACCO ABUSE Assessment & Plan: - encouraged tobacco cessation and discussed association with stress urinary incontinence due to cough   Time spent: I spent 70 minutes dedicated to the care of this patient on the date of this encounter to include pre-visit review of records, face-to-face time with the patient discussing recurrent UTI, stress urinary incontinence, nocturia, tobacco use, T2DM, fecal incontinence, and post visit documentation and ordering medication/ testing.   Loleta Chance, MD

## 2023-11-26 NOTE — Assessment & Plan Note (Addendum)
-   well controlled with HbA1C 6.5 on 07/03/23 - discussed association of glycemic control with urinary leakage

## 2023-11-26 NOTE — Assessment & Plan Note (Signed)
-   attributed to dietary triggers, 2 episodes in her lifetime - Treatment options include anti-diarrhea medication (loperamide/ Imodium OTC or prescription lomotil), fiber supplements, physical therapy, and possible sacral neuromodulation or surgery.

## 2023-11-26 NOTE — Patient Instructions (Addendum)
For treatment of recurrent urinary tract infections, we discussed management of recurrent UTIs including prophylaxis with a daily low dose antibiotic, transvaginal estrogen therapy, D-mannose, and cranberry supplements.  We discussed the role of diagnostic testing such as cystoscopy and upper tract imaging.     Start probiotics.   For vaginal atrophy (thinning of the vaginal tissue that can cause dryness and burning) and UTI prevention we discussed estrogen replacement in the form of vaginal cream.   Start vaginal estrogen therapy nightly for two weeks then 2 times weekly at night. This can be placed with your finger or an applicator inside the vagina and around the urethra.  Please let us know if the prescription is too expensive and we can look for alternative options.   Is vaginal estrogen therapy safe for me? Vaginal estrogen preparations act on the vaginal skin, and only a very tiny amount is absorbed into the bloodstream (0.01%).  They work in a similar way to hand or face cream.  There is minimal absorption and they are therefore perfectly safe. If you have had breast cancer and have persistent troublesome symptoms which aren't settling with vaginal moisturisers and lubricants, local estrogen treatment may be a possibility, but consultation with your oncologist should take place first.   Accidental Bowel Leakage:  - Treatment options include anti-diarrhea medication (loperamide/ Imodium OTC or prescription lomotil), fiber supplements, physical therapy, and possible sacral neuromodulation or surgery.     For treatment of stress urinary incontinence, which is leakage with physical activity/movement/strainging/coughing, we discussed expectant management versus nonsurgical options versus surgery. Nonsurgical options include weight loss, physical therapy, as well as a pessary.  Surgical options include a midurethral sling, which is a synthetic mesh sling that acts like a hammock under the urethra to  prevent leakage of urine, a Burch urethropexy, and transurethral injection of a bulking agent.   Please consider reduction of tobacco use.

## 2023-11-26 NOTE — Assessment & Plan Note (Addendum)
-   POCT UA + protein - 06/25/23 Urgent care for R CVA tenderness. UA + leuk/heme. Culture > 100K pansensitive E. Coli Rx Cipro - 04/04/23 office visit for suprapubic pain, decreased UOP, urinary frequency, R flank pain UA + leuk/nit/heme. Culture > 100K pansensitive E. Coli Rx Bactrim. - prior hospitalization 05/2022 due to hyponatremia and COPD exacerbation with asymptomatic bacteruria.  - 2021 hospitalization for E. Coli urosepsis with CT showing urothelial enhancement of the right renal pelvis and diffusely involving the right ureter, suspicious for ascending urinary tract infection. Consider repeat imaging. - For treatment of recurrent urinary tract infections, we discussed management of recurrent UTIs including prophylaxis with a daily low dose antibiotic, transvaginal estrogen therapy, D-mannose, and cranberry supplements.  We discussed the role of diagnostic testing such as cystoscopy and upper tract imaging.   - Rx low dose vaginal estrogen with estradiol. We discussed the potential risks associated with hormone replacement including stroke, heart attack, and blood clots; and the fact that these risks are very low with vaginal estrogen use due to the very low systemic absorption rate of ~ 0.01% with a twice-week regimen. - pt to call office for urine testing if patient experiences change in urinary symptoms.

## 2023-11-26 NOTE — Assessment & Plan Note (Addendum)
-   encouraged tobacco cessation and discussed association with stress urinary incontinence due to cough - low threshold for cystoscopy due to tobacco use

## 2024-01-16 ENCOUNTER — Ambulatory Visit: Payer: PPO | Admitting: Adult Health

## 2024-01-19 ENCOUNTER — Other Ambulatory Visit: Payer: Self-pay | Admitting: Adult Health

## 2024-01-19 DIAGNOSIS — I10 Essential (primary) hypertension: Secondary | ICD-10-CM

## 2024-01-22 ENCOUNTER — Other Ambulatory Visit: Payer: Self-pay | Admitting: Adult Health

## 2024-01-22 DIAGNOSIS — G4700 Insomnia, unspecified: Secondary | ICD-10-CM

## 2024-01-22 DIAGNOSIS — E119 Type 2 diabetes mellitus without complications: Secondary | ICD-10-CM

## 2024-01-22 DIAGNOSIS — J449 Chronic obstructive pulmonary disease, unspecified: Secondary | ICD-10-CM

## 2024-01-22 DIAGNOSIS — J441 Chronic obstructive pulmonary disease with (acute) exacerbation: Secondary | ICD-10-CM

## 2024-01-22 DIAGNOSIS — F4323 Adjustment disorder with mixed anxiety and depressed mood: Secondary | ICD-10-CM

## 2024-01-22 DIAGNOSIS — I10 Essential (primary) hypertension: Secondary | ICD-10-CM

## 2024-01-22 MED ORDER — ALBUTEROL SULFATE HFA 108 (90 BASE) MCG/ACT IN AERS: 2.0000 | INHALATION_SPRAY | Freq: Four times a day (QID) | RESPIRATORY_TRACT | 2 refills | Status: DC | PRN

## 2024-01-22 MED ORDER — CLONAZEPAM 0.5 MG PO TABS: 0.5000 mg | ORAL_TABLET | Freq: Two times a day (BID) | ORAL | 2 refills | Status: DC | PRN

## 2024-01-22 MED ORDER — SPIRONOLACTONE 25 MG PO TABS: ORAL_TABLET | ORAL | 1 refills | Status: DC

## 2024-01-22 MED ORDER — FLUOXETINE HCL 40 MG PO CAPS: 40.0000 mg | ORAL_CAPSULE | Freq: Every day | ORAL | 1 refills | Status: DC

## 2024-01-22 MED ORDER — METFORMIN HCL 500 MG PO TABS: 500.0000 mg | ORAL_TABLET | Freq: Two times a day (BID) | ORAL | 1 refills | Status: DC

## 2024-01-23 ENCOUNTER — Ambulatory Visit: Payer: PPO | Admitting: Adult Health

## 2024-02-11 ENCOUNTER — Other Ambulatory Visit: Payer: Self-pay | Admitting: Adult Health

## 2024-02-11 DIAGNOSIS — I10 Essential (primary) hypertension: Secondary | ICD-10-CM

## 2024-02-14 ENCOUNTER — Other Ambulatory Visit: Payer: Self-pay | Admitting: Adult Health

## 2024-02-14 ENCOUNTER — Encounter: Payer: Self-pay | Admitting: Internal Medicine

## 2024-02-14 DIAGNOSIS — E119 Type 2 diabetes mellitus without complications: Secondary | ICD-10-CM

## 2024-02-21 ENCOUNTER — Encounter: Payer: Self-pay | Admitting: Adult Health

## 2024-02-21 DIAGNOSIS — E119 Type 2 diabetes mellitus without complications: Secondary | ICD-10-CM

## 2024-02-22 MED ORDER — METFORMIN HCL 500 MG PO TABS: 500.0000 mg | ORAL_TABLET | Freq: Two times a day (BID) | ORAL | 0 refills | Status: DC

## 2024-02-26 ENCOUNTER — Ambulatory Visit (INDEPENDENT_AMBULATORY_CARE_PROVIDER_SITE_OTHER): Admitting: Adult Health

## 2024-02-26 VITALS — BP 134/76 | HR 76 | Temp 97.5°F | Ht 60.0 in | Wt 137.0 lb

## 2024-02-26 DIAGNOSIS — Z7984 Long term (current) use of oral hypoglycemic drugs: Secondary | ICD-10-CM | POA: Diagnosis not present

## 2024-02-26 DIAGNOSIS — E119 Type 2 diabetes mellitus without complications: Secondary | ICD-10-CM

## 2024-02-26 DIAGNOSIS — I10 Essential (primary) hypertension: Secondary | ICD-10-CM | POA: Diagnosis not present

## 2024-02-26 LAB — POCT GLYCOSYLATED HEMOGLOBIN (HGB A1C): Hemoglobin A1C: 6.8 % — AB (ref 4.0–5.6)

## 2024-02-26 NOTE — Progress Notes (Signed)
 Subjective:    Patient ID: Shelly Clark, female    DOB: 1946/11/05, 78 y.o.   MRN: [id number]  HPI 78 year old female female who  has a past medical history of Benign hematuria, Diabetes mellitus without complication (HCC), Esophageal dysmotility, Hyperlipidemia, Hypertension, Migraines, Nicotine dependence, cigarettes, uncomplicated (11/12/2020), Osteoporosis, and Tobacco abuse.  She presents to the office today for follow-up regarding hypertension and diabetes type 2  Diabetes mellitus type 2-managed with glipizide 5 mg daily and metformin 500 mg twice daily.  She does not check her blood sugars at home.  She denies any episodes of hypoglycemia. Lab Results  Component Value Date   HGBA1C 6.5 07/03/2023   HGBA1C 6.2 (A) 10/12/2022   HGBA1C 6.3 04/11/2022   Hypertension-managed with Norvasc 10 mg daily, lisinopril 40 mg daily, and spirolactone 25 mg daily. She denies dizziness, lightheadedness, blurred vision, chest pain, or shortness of breath BP Readings from Last 3 Encounters:  02/26/24 134/76  11/26/23 130/76  07/03/23 120/60    Review of Systems See HPI   Past Medical History:  Diagnosis Date   Benign hematuria    neg urology workup   Diabetes mellitus without complication (HCC)    Esophageal dysmotility    Hyperlipidemia    Hypertension    Migraines    Nicotine dependence, cigarettes, uncomplicated 11/12/2020   Osteoporosis    Tobacco abuse     Social History   Socioeconomic History   Marital status: Married    Spouse name: Not on file   Number of children: Not on file   Years of education: Not on file   Highest education level: Not on file  Occupational History   Occupation: OWNER    Employer: [employer]    Comment: Restaurant  Tobacco Use   Smoking status: Every Day    Current packs/day: 2.00    Average packs/day: 2.0 packs/day for 40.0 years (80.0 ttl pk-yrs)    Types: Cigarettes   Smokeless tobacco: Never   Tobacco comments:    1ppd  Vaping Use    Vaping status: Never Used  Substance and Sexual Activity   Alcohol use: No   Drug use: No   Sexual activity: Not Currently    Partners: Male    Birth control/protection: Post-menopausal  Other Topics Concern   Not on file  Social History Narrative   ** Merged History Encounter **       Owns a home  She is married  Two children      Social Drivers of Corporate investment banker Strain: Low Risk  (08/02/2022)   Overall Financial Resource Strain (CARDIA)    Difficulty of Paying Living Expenses: Not hard at all  Food Insecurity: No Food Insecurity (08/02/2022)   Hunger Vital Sign    Worried About Running Out of Food in the Last Year: Never true    Ran Out of Food in the Last Year: Never true  Transportation Needs: No Transportation Needs (08/02/2022)   PRAPARE - Administrator, Civil Service (Medical): No    Lack of Transportation (Non-Medical): No  Physical Activity: Inactive (08/02/2022)   Exercise Vital Sign    Days of Exercise per Week: 0 days    Minutes of Exercise per Session: 0 min  Stress: No Stress Concern Present (08/02/2022)   [employer] of Occupational Health - Occupational Stress Questionnaire    Feeling of Stress : Not at all  Social Connections: Moderately Isolated (08/02/2022)   Social Connection and  Isolation Panel [NHANES]    Frequency of Communication with Friends and Family: More than three times a week    Frequency of Social Gatherings with Friends and Family: More than three times a week    Attends Religious Services: Never    Database administrator or Organizations: No    Attends Banker Meetings: Never    Marital Status: Married  Catering manager Violence: Not At Risk (08/02/2022)   Humiliation, Afraid, Rape, and Kick questionnaire    Fear of Current or Ex-Partner: No    Emotionally Abused: No    Physically Abused: No    Sexually Abused: No    Past Surgical History:  Procedure Laterality Date   MOUTH SURGERY     TUBAL  LIGATION  1979    Family History  Problem Relation Age of Onset   Ovarian cancer Mother    Diabetes Mother    Hypertension Mother    Hyperlipidemia Mother    Diabetes Father    Hypertension Father    Hyperlipidemia Father    Hypertension Brother    Renal cancer Brother    Hypertension Daughter    Asthma Daughter    Hypertension Daughter    Uterine cancer Neg Hx    Bladder Cancer Neg Hx     Allergies  Allergen Reactions   Phenylpropanolamine Nausea And Vomiting and Other (See Comments)    "Tavist-D"   Prednisone Nausea Only and Other (See Comments)    Can tolerate via IV, but not orally   Codeine Nausea And Vomiting   Nicoderm [Nicotine] Rash and Other (See Comments)    Patient said she is ALLERGIC to the adhesive   Rocephin [Ceftriaxone] Rash and Other (See Comments)    06/11/22 got "redness on the neck and face" after an infusion of Rocephin   Tavist [Clemastine] Nausea And Vomiting    "Tavist-D"    Current Outpatient Medications on File Prior to Visit  Medication Sig Dispense Refill   albuterol (VENTOLIN HFA) 108 (90 Base) MCG/ACT inhaler Inhale 2 puffs into the lungs every 6 (six) hours as needed for wheezing or shortness of breath. 8 g 2   amLODipine (NORVASC) 10 MG tablet TAKE 1 TABLET(10 MG) BY MOUTH DAILY 90 tablet 2   B Complex-C (B-COMPLEX WITH VITAMIN C) tablet Take 1 tablet by mouth daily.     clonazePAM (KLONOPIN) 0.5 MG tablet Take 1 tablet (0.5 mg total) by mouth 2 (two) times daily as needed. for sleep and anxiety 60 tablet 2   estradiol (ESTRACE) 0.1 MG/GM vaginal cream Place 0.5 g vaginally 2 (two) times a week. Place 0.5g nightly for two weeks then twice a week after 30 g 3   FLUoxetine (PROZAC) 40 MG capsule Take 1 capsule (40 mg total) by mouth daily. 90 capsule 1   fluticasone-salmeterol (ADVAIR) 100-50 MCG/ACT AEPB Inhale 1 puff into the lungs 2 times daily. 60 each 6   glipiZIDE (GLUCOTROL XL) 5 MG 24 hr tablet TAKE 1 TABLET(5 MG) BY MOUTH DAILY WITH  BREAKFAST 90 tablet 1   lisinopril (ZESTRIL) 40 MG tablet TAKE 1 TABLET(40 MG) BY MOUTH DAILY. FOLLOW UP FOR PHYSICAL EXAM FOR FURTHER REFILLS 90 tablet 0   metFORMIN (GLUCOPHAGE) 500 MG tablet Take 1 tablet (500 mg total) by mouth 2 (two) times daily with a meal. 60 tablet 0   montelukast (SINGULAIR) 10 MG tablet Take 1 tablet (10 mg total) by mouth at bedtime. 90 tablet 3   Multiple Vitamin (MULTIVITAMIN WITH  MINERALS) TABS Take 1 tablet by mouth daily with breakfast.     naproxen (NAPROSYN) 500 MG tablet Take 500 mg by mouth 2 (two) times daily as needed for mild pain.     ondansetron (ZOFRAN) 4 MG tablet Take 1 tablet (4 mg total) by mouth every 6 (six) hours. 12 tablet 0   simvastatin (ZOCOR) 20 MG tablet TAKE 1 TABLET BY MOUTH EVERY DAY 90 tablet 3   spironolactone (ALDACTONE) 25 MG tablet TAKE 1 TABLET(25 MG) BY MOUTH DAILY 90 tablet 1   triamcinolone cream (KENALOG) 0.1 % Apply 1 Application topically 2 (two) times daily. 30 g 0   TYLENOL 500 MG tablet Take 500-1,000 mg by mouth every 6 (six) hours as needed for mild pain or headache.     fexofenadine (ALLEGRA) 180 MG tablet Take 1 tablet (180 mg total) by mouth daily. 90 tablet 1   No current facility-administered medications on file prior to visit.    BP 134/76   Pulse 76   Temp (!) 97.5 F (36.4 C) (Oral)   Ht 5' (1.524 m)   Wt 137 lb (62.1 kg)   SpO2 97%   BMI 26.76 kg/m       Objective:   Physical Exam Vitals and nursing note reviewed.  Constitutional:      Appearance: Normal appearance.  Cardiovascular:     Rate and Rhythm: Normal rate and regular rhythm.     Pulses: Normal pulses.     Heart sounds: Normal heart sounds.  Pulmonary:     Effort: Pulmonary effort is normal.     Breath sounds: Normal breath sounds.  Skin:    General: Skin is warm and dry.  Neurological:     General: No focal deficit present.     Mental Status: She is alert and oriented to person, place, and time.  Psychiatric:        Mood and  Affect: Mood normal.        Behavior: Behavior normal.        Thought Content: Thought content normal.        Judgment: Judgment normal.        Assessment & Plan:  1. Diabetes mellitus treated with oral medication (HCC) (Primary)  - POC HgB A1c- 6.8  - No change in medication  - Follow up in 3 months for CPE    2. Essential hypertension - Controlled. No change in medication   Shirline Frees, NP

## 2024-02-26 NOTE — Patient Instructions (Signed)
 Your A1c was 6.8   Lets follow up in 3 months for your physical exam

## 2024-03-28 ENCOUNTER — Encounter: Payer: Self-pay | Admitting: Adult Health

## 2024-03-28 ENCOUNTER — Ambulatory Visit (INDEPENDENT_AMBULATORY_CARE_PROVIDER_SITE_OTHER): Admitting: Adult Health

## 2024-03-28 VITALS — BP 130/80 | HR 73 | Temp 97.5°F | Ht 59.75 in | Wt 134.0 lb

## 2024-03-28 DIAGNOSIS — Z720 Tobacco use: Secondary | ICD-10-CM | POA: Diagnosis not present

## 2024-03-28 DIAGNOSIS — G4700 Insomnia, unspecified: Secondary | ICD-10-CM | POA: Diagnosis not present

## 2024-03-28 DIAGNOSIS — I10 Essential (primary) hypertension: Secondary | ICD-10-CM | POA: Diagnosis not present

## 2024-03-28 DIAGNOSIS — E785 Hyperlipidemia, unspecified: Secondary | ICD-10-CM | POA: Diagnosis not present

## 2024-03-28 DIAGNOSIS — F419 Anxiety disorder, unspecified: Secondary | ICD-10-CM | POA: Diagnosis not present

## 2024-03-28 DIAGNOSIS — Z7984 Long term (current) use of oral hypoglycemic drugs: Secondary | ICD-10-CM

## 2024-03-28 DIAGNOSIS — E119 Type 2 diabetes mellitus without complications: Secondary | ICD-10-CM

## 2024-03-28 DIAGNOSIS — Z780 Asymptomatic menopausal state: Secondary | ICD-10-CM

## 2024-03-28 DIAGNOSIS — Z0000 Encounter for general adult medical examination without abnormal findings: Secondary | ICD-10-CM

## 2024-03-28 LAB — URINALYSIS, ROUTINE W REFLEX MICROSCOPIC
Bilirubin Urine: NEGATIVE
Hgb urine dipstick: NEGATIVE
Ketones, ur: NEGATIVE
Nitrite: NEGATIVE
Specific Gravity, Urine: <=1.005 — AB (ref 1.000–1.030)
Total Protein, Urine: NEGATIVE
Urine Glucose: NEGATIVE
Urobilinogen, UA: 0.2 (ref 0.0–1.0)
pH: 6 (ref 5.0–8.0)

## 2024-03-28 LAB — COMPREHENSIVE METABOLIC PANEL WITH GFR
ALT: 41 U/L — ABNORMAL HIGH (ref 0–35)
AST: 36 U/L (ref 0–37)
Albumin: 4.9 g/dL (ref 3.5–5.2)
Alkaline Phosphatase: 92 U/L (ref 39–117)
BUN: 28 mg/dL — ABNORMAL HIGH (ref 6–23)
CO2: 23 meq/L (ref 19–32)
Calcium: 9.9 mg/dL (ref 8.4–10.5)
Chloride: 100 meq/L (ref 96–112)
Creatinine, Ser: 0.79 mg/dL (ref 0.40–1.20)
GFR: 73.14 mL/min (ref >60.00)
Glucose, Bld: 97 mg/dL (ref 70–99)
Potassium: 4.4 meq/L (ref 3.5–5.1)
Sodium: 132 meq/L — ABNORMAL LOW (ref 135–145)
Total Bilirubin: 0.7 mg/dL (ref 0.2–1.2)
Total Protein: 7.9 g/dL (ref 6.0–8.3)

## 2024-03-28 LAB — MICROALBUMIN / CREATININE URINE RATIO
Creatinine,U: 21.7 mg/dL
Microalb Creat Ratio: 438.9 mg/g — ABNORMAL HIGH (ref 0.0–30.0)
Microalb, Ur: 9.5 mg/dL — ABNORMAL HIGH (ref 0.0–1.9)

## 2024-03-28 LAB — LIPID PANEL
Cholesterol: 155 mg/dL (ref 0–200)
HDL: 55.9 mg/dL (ref >39.00)
LDL Cholesterol: 71 mg/dL (ref 0–99)
NonHDL: 98.7
Total CHOL/HDL Ratio: 3
Triglycerides: 140 mg/dL (ref 0.0–149.0)
VLDL: 28 mg/dL (ref 0.0–40.0)

## 2024-03-28 LAB — TSH: TSH: 1.87 u[IU]/mL (ref 0.35–5.50)

## 2024-03-28 LAB — VITAMIN D 25 HYDROXY (VIT D DEFICIENCY, FRACTURES): VITD: 32 ng/mL (ref 30.00–100.00)

## 2024-03-28 MED ORDER — SPIRONOLACTONE 25 MG PO TABS: ORAL_TABLET | ORAL | 3 refills | Status: AC

## 2024-03-28 NOTE — Progress Notes (Signed)
 Subjective:    Patient ID: Shelly Clark, female    DOB: 28-Feb-1946, 78 y.o.   MRN: [id number]  HPI Patient presents for yearly preventative medicine examination. She is a pleasant 78 year old female who  has a past medical history of Benign hematuria, Diabetes mellitus without complication (HCC), Esophageal dysmotility, Hyperlipidemia, Hypertension, Migraines, Nicotine  dependence, cigarettes, uncomplicated (11/12/2020), Osteoporosis, and Tobacco abuse.  Diabetes mellitus type 2-managed with glipizide  5 mg XR daily and metformin  500 mg twice daily.  She does not check her blood sugars at home.  She denies any episodes of hypoglycemia. She is active but does not eat a specific diet. A1c done a month ago and was 6.8  Lab Results  Component Value Date   HGBA1C 6.8 (A) 02/26/2024   HGBA1C 6.5 07/03/2023   HGBA1C 6.2 (A) 10/12/2022   Hypertension-managed with Norvasc  10 mg daily, lisinopril  40 mg daily, and spirolactone 25 mg daily. She denies dizziness, lightheadedness, blurred vision, chest pain, or shortness of breath BP Readings from Last 3 Encounters:  03/28/24 130/80  02/26/24 134/76  11/26/23 130/76   Anxiety/Insomnia - doing well with Prozac  40 mg daily and takes Klonopin  0.5 mg nightly as needed  Hyperlipidemia-prescribed Zocor  20 mg daily.  She denies algia or fatigue Lab Results  Component Value Date   CHOL 187 10/05/2021   HDL 63.90 10/05/2021   LDLCALC 90 10/05/2021   LDLDIRECT 91.0 04/16/2020   TRIG 166.0 (H) 10/05/2021   CHOLHDL 3 10/05/2021    Tobacco Use - continues to smoke about a half pack. She does not plan to quit   All immunizations and health maintenance protocols were reviewed with the patient and needed orders were placed. She will get her shingles vaccination at the pharmacy.   Appropriate screening laboratory values were ordered for the patient including screening of hyperlipidemia, renal function and hepatic function.   Medication reconciliation,   past medical history, social history, problem list and allergies were reviewed in detail with the patient  Goals were established with regard to weight loss, exercise, and  diet in compliance with medications  She is up to date on colon cancer screening. She is due for her mammogram and dexa scan  She has no acute complaints today   Review of Systems  Constitutional: Negative.   HENT: Negative.    Eyes: Negative.   Respiratory: Negative.    Cardiovascular: Negative.   Gastrointestinal: Negative.   Endocrine: Negative.   Genitourinary: Negative.   Musculoskeletal: Negative.   Skin: Negative.   Allergic/Immunologic: Negative.   Neurological: Negative.   Hematological: Negative.   Psychiatric/Behavioral: Negative.     Past Medical History:  Diagnosis Date   Benign hematuria    neg urology workup   Diabetes mellitus without complication (HCC)    Esophageal dysmotility    Hyperlipidemia    Hypertension    Migraines    Nicotine  dependence, cigarettes, uncomplicated 11/12/2020   Osteoporosis    Tobacco abuse     Social History   Socioeconomic History   Marital status: Married    Spouse name: Not on file   Number of children: Not on file   Years of education: Not on file   Highest education level: Not on file  Occupational History   Occupation: OWNER    Employer: [employer]    Comment: Restaurant  Tobacco Use   Smoking status: Every Day    Current packs/day: 2.00    Average packs/day: 2.0 packs/day for 40.0 years (80.0 ttl  pk-yrs)    Types: Cigarettes   Smokeless tobacco: Never   Tobacco comments:    1ppd  Vaping Use   Vaping status: Never Used  Substance and Sexual Activity   Alcohol use: No   Drug use: No   Sexual activity: Not Currently    Partners: Male    Birth control/protection: Post-menopausal  Other Topics Concern   Not on file  Social History Narrative   ** Merged History Encounter **       Owns a home  She is married  Two children       Social Drivers of Corporate investment banker Strain: Low Risk  (08/02/2022)   Overall Financial Resource Strain (CARDIA)    Difficulty of Paying Living Expenses: Not hard at all  Food Insecurity: No Food Insecurity (08/02/2022)   Hunger Vital Sign    Worried About Running Out of Food in the Last Year: Never true    Ran Out of Food in the Last Year: Never true  Transportation Needs: No Transportation Needs (08/02/2022)   PRAPARE - Administrator, Civil Service (Medical): No    Lack of Transportation (Non-Medical): No  Physical Activity: Inactive (08/02/2022)   Exercise Vital Sign    Days of Exercise per Week: 0 days    Minutes of Exercise per Session: 0 min  Stress: No Stress Concern Present (08/02/2022)   [employer] of Occupational Health - Occupational Stress Questionnaire    Feeling of Stress : Not at all  Social Connections: Moderately Isolated (08/02/2022)   Social Connection and Isolation Panel [NHANES]    Frequency of Communication with Friends and Family: More than three times a week    Frequency of Social Gatherings with Friends and Family: More than three times a week    Attends Religious Services: Never    Database administrator or Organizations: No    Attends Banker Meetings: Never    Marital Status: Married  Catering manager Violence: Not At Risk (08/02/2022)   Humiliation, Afraid, Rape, and Kick questionnaire    Fear of Current or Ex-Partner: No    Emotionally Abused: No    Physically Abused: No    Sexually Abused: No    Past Surgical History:  Procedure Laterality Date   MOUTH SURGERY     TUBAL LIGATION  1979    Family History  Problem Relation Age of Onset   Ovarian cancer Mother    Diabetes Mother    Hypertension Mother    Hyperlipidemia Mother    Diabetes Father    Hypertension Father    Hyperlipidemia Father    Hypertension Brother    Renal cancer Brother    Hypertension Daughter    Asthma Daughter    Hypertension  Daughter    Uterine cancer Neg Hx    Bladder Cancer Neg Hx     Allergies  Allergen Reactions   Phenylpropanolamine Nausea And Vomiting and Other (See Comments)    "Tavist-D"   Prednisone  Nausea Only and Other (See Comments)    Can tolerate via IV, but not orally   Codeine Nausea And Vomiting   Nicoderm [Nicotine ] Rash and Other (See Comments)    Patient said she is ALLERGIC to the adhesive   Rocephin  [Ceftriaxone ] Rash and Other (See Comments)    06/11/22 got "redness on the neck and face" after an infusion of Rocephin    Tavist [Clemastine] Nausea And Vomiting    "Tavist-D"    Current Outpatient Medications  on File Prior to Visit  Medication Sig Dispense Refill   albuterol  (VENTOLIN  HFA) 108 (90 Base) MCG/ACT inhaler Inhale 2 puffs into the lungs every 6 (six) hours as needed for wheezing or shortness of breath. 8 g 2   amLODipine  (NORVASC ) 10 MG tablet TAKE 1 TABLET(10 MG) BY MOUTH DAILY 90 tablet 2   B Complex-C (B-COMPLEX WITH VITAMIN C) tablet Take 1 tablet by mouth daily.     clonazePAM  (KLONOPIN ) 0.5 MG tablet Take 1 tablet (0.5 mg total) by mouth 2 (two) times daily as needed. for sleep and anxiety 60 tablet 2   estradiol  (ESTRACE ) 0.1 MG/GM vaginal cream Place 0.5 g vaginally 2 (two) times a week. Place 0.5g nightly for two weeks then twice a week after 30 g 3   fexofenadine  (ALLEGRA ) 180 MG tablet Take 1 tablet (180 mg total) by mouth daily. 90 tablet 1   FLUoxetine  (PROZAC ) 40 MG capsule Take 1 capsule (40 mg total) by mouth daily. 90 capsule 1   fluticasone -salmeterol (ADVAIR ) 100-50 MCG/ACT AEPB Inhale 1 puff into the lungs 2 times daily. 60 each 6   glipiZIDE  (GLUCOTROL  XL) 5 MG 24 hr tablet TAKE 1 TABLET(5 MG) BY MOUTH DAILY WITH BREAKFAST 90 tablet 1   lisinopril  (ZESTRIL ) 40 MG tablet TAKE 1 TABLET(40 MG) BY MOUTH DAILY. FOLLOW UP FOR PHYSICAL EXAM FOR FURTHER REFILLS 90 tablet 0   metFORMIN  (GLUCOPHAGE ) 500 MG tablet Take 1 tablet (500 mg total) by mouth 2 (two) times  daily with a meal. 60 tablet 0   montelukast  (SINGULAIR ) 10 MG tablet Take 1 tablet (10 mg total) by mouth at bedtime. 90 tablet 3   Multiple Vitamin (MULTIVITAMIN WITH MINERALS) TABS Take 1 tablet by mouth daily with breakfast.     naproxen  (NAPROSYN ) 500 MG tablet Take 500 mg by mouth 2 (two) times daily as needed for mild pain.     ondansetron  (ZOFRAN ) 4 MG tablet Take 1 tablet (4 mg total) by mouth every 6 (six) hours. 12 tablet 0   simvastatin  (ZOCOR ) 20 MG tablet TAKE 1 TABLET BY MOUTH EVERY DAY 90 tablet 3   spironolactone  (ALDACTONE ) 25 MG tablet TAKE 1 TABLET(25 MG) BY MOUTH DAILY 90 tablet 1   triamcinolone  cream (KENALOG ) 0.1 % Apply 1 Application topically 2 (two) times daily. 30 g 0   TYLENOL  500 MG tablet Take 500-1,000 mg by mouth every 6 (six) hours as needed for mild pain or headache.     No current facility-administered medications on file prior to visit.    BP 130/80   Pulse 73   Temp (!) 97.5 F (36.4 C) (Oral)   Ht 4' 11.75" (1.518 m)   Wt 134 lb (60.8 kg)   SpO2 97%   BMI 26.39 kg/m       Objective:   Physical Exam Vitals and nursing note reviewed.  Constitutional:      General: She is not in acute distress.    Appearance: Normal appearance. She is overweight. She is not ill-appearing.  HENT:     Head: Normocephalic and atraumatic.     Right Ear: Tympanic membrane, ear canal and external ear normal. There is no impacted cerumen.     Left Ear: Tympanic membrane, ear canal and external ear normal. There is no impacted cerumen.     Nose: Nose normal. No congestion or rhinorrhea.     Mouth/Throat:     Mouth: Mucous membranes are moist.     Pharynx: Oropharynx is clear.  Eyes:     Extraocular Movements: Extraocular movements intact.     Conjunctiva/sclera: Conjunctivae normal.     Pupils: Pupils are equal, round, and reactive to light.  Neck:     Vascular: No carotid bruit.  Cardiovascular:     Rate and Rhythm: Normal rate and regular rhythm.      Pulses: Normal pulses.     Heart sounds: No murmur heard.    No friction rub. No gallop.  Pulmonary:     Effort: Pulmonary effort is normal.     Breath sounds: Normal breath sounds.  Abdominal:     General: Abdomen is flat. Bowel sounds are normal. There is no distension.     Palpations: Abdomen is soft. There is no mass.     Tenderness: There is no abdominal tenderness. There is no guarding or rebound.     Hernia: No hernia is present.  Musculoskeletal:        General: Normal range of motion.     Cervical back: Normal range of motion and neck supple.  Lymphadenopathy:     Cervical: No cervical adenopathy.  Skin:    General: Skin is warm and dry.     Capillary Refill: Capillary refill takes less than 2 seconds.  Neurological:     General: No focal deficit present.     Mental Status: She is alert and oriented to person, place, and time.  Psychiatric:        Mood and Affect: Mood normal.        Behavior: Behavior normal.        Thought Content: Thought content normal.        Judgment: Judgment normal.        Assessment & Plan:  1. Routine general medical examination at a health care facility (Primary) Today patient counseled on age appropriate routine health concerns for screening and prevention, each reviewed and up to date or declined. Immunizations reviewed and up to date or declined. Labs ordered and reviewed. Risk factors for depression reviewed and negative. Hearing function and visual acuity are intact. ADLs screened and addressed as needed. Functional ability and level of safety reviewed and appropriate. Education, counseling and referrals performed based on assessed risks today. Patient provided with a copy of personalized plan for preventive services. - Advised to get shingles vaccination at pharmacy  - Quit smoking  - eat healthy and stay active  - Follow up in one year for CPE   2. Diabetes mellitus treated with oral medication (HCC) - Follow up in 2 months for  repeat testing  - CBC with Differential/Platelet; Future - Comprehensive metabolic panel with GFR; Future - Lipid panel; Future - TSH; Future - Microalbumin/Creatinine Ratio, Urine; Future - Urinalysis; Future  3. Essential hypertension - Well controlled. No change in medication  - spironolactone  (ALDACTONE ) 25 MG tablet; TAKE 1 TABLET(25 MG) BY MOUTH DAILY  Dispense: 90 tablet; Refill: 3 - CBC with Differential/Platelet; Future - Comprehensive metabolic panel with GFR; Future - Lipid panel; Future - TSH; Future - Microalbumin/Creatinine Ratio, Urine; Future - Urinalysis; Future  4. Anxiety - Continue with Prozac   5. Insomnia, unspecified type - continue klonopin  PRN  6. Hyperlipidemia, unspecified hyperlipidemia type - Consider increase in statin  - CBC with Differential/Platelet; Future - Comprehensive metabolic panel with GFR; Future - Lipid panel; Future - TSH; Future - Microalbumin/Creatinine Ratio, Urine; Future - Urinalysis; Future  7. Tobacco use - Encouraged to quit  - CT CHEST LUNG CA SCREEN LOW DOSE W/O  CM; Future  8. Asymptomatic postmenopausal estrogen deficiency  - MM 3D SCREENING MAMMOGRAM BILATERAL BREAST; Future - DG Bone Density; Future - VITAMIN D 25 Hydroxy (Vit-D Deficiency, Fractures); Future  Alto Atta, NP

## 2024-03-29 LAB — CBC WITH DIFFERENTIAL/PLATELET
Basophils Absolute: 0 10*3/uL (ref 0.0–0.1)
Basophils Relative: 0.7 % (ref 0.0–3.0)
Eosinophils Absolute: 0.1 10*3/uL (ref 0.0–0.7)
Eosinophils Relative: 1.8 % (ref 0.0–5.0)
HCT: 44.5 % (ref 36.0–46.0)
Hemoglobin: 14.4 g/dL (ref 12.0–15.0)
Lymphocytes Relative: 35.8 % (ref 12.0–46.0)
Lymphs Abs: 2.4 10*3/uL (ref 0.7–4.0)
MCHC: 32.4 g/dL (ref 30.0–36.0)
MCV: 87.7 fl (ref 78.0–100.0)
Monocytes Absolute: 0.3 10*3/uL (ref 0.1–1.0)
Monocytes Relative: 4.4 % (ref 3.0–12.0)
Neutro Abs: 3.8 10*3/uL (ref 1.4–7.7)
Neutrophils Relative %: 57.3 % (ref 43.0–77.0)
Platelets: 366 10*3/uL (ref 150.0–400.0)
RBC: 5.08 Mil/uL (ref 3.87–5.11)
RDW: 14 % (ref 11.5–15.5)
WBC: 6.7 10*3/uL (ref 4.0–10.5)

## 2024-03-31 ENCOUNTER — Other Ambulatory Visit: Payer: Self-pay | Admitting: Adult Health

## 2024-03-31 DIAGNOSIS — F419 Anxiety disorder, unspecified: Secondary | ICD-10-CM

## 2024-03-31 DIAGNOSIS — G4700 Insomnia, unspecified: Secondary | ICD-10-CM

## 2024-03-31 DIAGNOSIS — Z720 Tobacco use: Secondary | ICD-10-CM

## 2024-03-31 DIAGNOSIS — E119 Type 2 diabetes mellitus without complications: Secondary | ICD-10-CM

## 2024-03-31 DIAGNOSIS — Z1231 Encounter for screening mammogram for malignant neoplasm of breast: Secondary | ICD-10-CM

## 2024-03-31 DIAGNOSIS — I10 Essential (primary) hypertension: Secondary | ICD-10-CM

## 2024-03-31 DIAGNOSIS — Z0000 Encounter for general adult medical examination without abnormal findings: Secondary | ICD-10-CM

## 2024-03-31 DIAGNOSIS — E785 Hyperlipidemia, unspecified: Secondary | ICD-10-CM

## 2024-03-31 DIAGNOSIS — Z780 Asymptomatic menopausal state: Secondary | ICD-10-CM

## 2024-04-15 ENCOUNTER — Ambulatory Visit
Admission: RE | Admit: 2024-04-15 | Discharge: 2024-04-15 | Disposition: A | Discharge status: Home / Self Care | Source: Ambulatory Visit | Attending: Adult Health | Admitting: Adult Health

## 2024-04-15 DIAGNOSIS — Z1231 Encounter for screening mammogram for malignant neoplasm of breast: Secondary | ICD-10-CM | POA: Diagnosis not present

## 2024-04-15 DIAGNOSIS — Z720 Tobacco use: Secondary | ICD-10-CM

## 2024-04-15 DIAGNOSIS — Z122 Encounter for screening for malignant neoplasm of respiratory organs: Secondary | ICD-10-CM | POA: Diagnosis not present

## 2024-04-15 DIAGNOSIS — F17210 Nicotine dependence, cigarettes, uncomplicated: Secondary | ICD-10-CM | POA: Diagnosis not present

## 2024-05-08 ENCOUNTER — Ambulatory Visit: Payer: Self-pay | Admitting: Adult Health

## 2024-05-09 ENCOUNTER — Encounter: Payer: Self-pay | Admitting: Adult Health

## 2024-05-09 NOTE — Telephone Encounter (Signed)
 FYI

## 2024-05-14 ENCOUNTER — Encounter: Payer: Self-pay | Admitting: Internal Medicine

## 2024-05-14 ENCOUNTER — Ambulatory Visit: Admitting: Internal Medicine

## 2024-05-14 VITALS — BP 144/69 | HR 76 | Temp 98.6°F | Ht 60.0 in | Wt 135.6 lb

## 2024-05-14 DIAGNOSIS — J439 Emphysema, unspecified: Secondary | ICD-10-CM

## 2024-05-14 DIAGNOSIS — J4489 Other specified chronic obstructive pulmonary disease: Secondary | ICD-10-CM | POA: Diagnosis not present

## 2024-05-14 DIAGNOSIS — Z87891 Personal history of nicotine dependence: Secondary | ICD-10-CM | POA: Diagnosis not present

## 2024-05-14 DIAGNOSIS — J441 Chronic obstructive pulmonary disease with (acute) exacerbation: Secondary | ICD-10-CM

## 2024-05-14 DIAGNOSIS — J449 Chronic obstructive pulmonary disease, unspecified: Secondary | ICD-10-CM

## 2024-05-14 MED ORDER — FLUTICASONE-SALMETEROL 100-50 MCG/ACT IN AEPB: 1.0000 | INHALATION_SPRAY | Freq: Two times a day (BID) | RESPIRATORY_TRACT | 11 refills | Status: AC

## 2024-05-14 MED ORDER — ALBUTEROL SULFATE HFA 108 (90 BASE) MCG/ACT IN AERS: 2.0000 | INHALATION_SPRAY | Freq: Four times a day (QID) | RESPIRATORY_TRACT | 11 refills | Status: AC | PRN

## 2024-05-14 NOTE — Progress Notes (Signed)
 Shelly Clark    [id number]    09/15/1946  Primary Care Physician:Nafziger, Randel Buss, NP Date of Appointment: 05/14/2024 Established Patient Visit  Chief complaint:   Chief Complaint  Patient presents with   Follow-up    Doing okay today.  Patient quit smoking 05/08/2024.     HPI: Shelly Clark is a 78 y.o. woman with COPD and tobacco use disorder. Quit smoking last week.   Interval Updates: Here for follow up for COPD.  Taking advair  - takes this twice daily.  She is out of her rescue inhaler.   She is still working at [employer] and keeping busy.   She does feel like it helps her breathing.  No interval exacerbations for COPD.   Daughter notes confusion about when to take inhalers.   Notes some fatigue. Goes to sleep 11-12. Wake up at [facility], goes back to sleep for another three hours. She does snore. Wakes up at night to go pee. Denies witnessed apneas.    I have reviewed the patient's family social and past medical history and updated as appropriate.   Past Medical History:  Diagnosis Date   Benign hematuria    neg urology workup   Diabetes mellitus without complication (HCC)    Esophageal dysmotility    Hyperlipidemia    Hypertension    Migraines    Nicotine  dependence, cigarettes, uncomplicated 11/12/2020   Osteoporosis    Tobacco abuse     Past Surgical History:  Procedure Laterality Date   MOUTH SURGERY     TUBAL LIGATION  1979    Family History  Problem Relation Age of Onset   Ovarian cancer Mother    Diabetes Mother    Hypertension Mother    Hyperlipidemia Mother    Diabetes Father    Hypertension Father    Hyperlipidemia Father    Hypertension Daughter    Asthma Daughter    Hypertension Daughter    Hypertension Brother    Renal cancer Brother    Uterine cancer Neg Hx    Bladder Cancer Neg Hx    Breast cancer Neg Hx     Social History   Occupational History   Occupation: Heritage manager: [employer]    Comment:  Restaurant  Tobacco Use   Smoking status: Former    Current packs/day: 2.00    Average packs/day: 2.0 packs/day    Types: Cigarettes    Start date: 05/08/2024   Smokeless tobacco: Never   Tobacco comments:    Quit smoking 05/08/2024  Vaping Use   Vaping status: Never Used  Substance and Sexual Activity   Alcohol use: No   Drug use: No   Sexual activity: Not Currently    Partners: Male    Birth control/protection: Post-menopausal     Physical Exam: Blood pressure (!) 144/69, pulse 76, temperature 98.6 F (37 C), temperature source Oral, height 5' (1.524 m), weight 135 lb 9.6 oz (61.5 kg), SpO2 94%.  Gen:      No acute distress Lungs:    No increased respiratory effort, symmetric chest wall excursion, clear to auscultation bilaterally, no wheezes or crackles CV:         Regular rate and rhythm; no murmurs, rubs, or gallops.  No pedal edema   Data Reviewed: Imaging: I have personally reviewed the 2025 LDCT CT Chest shows emphysema, no concerning nodules  PFTs:     Latest Ref Rng & Units 09/25/2022  9:57 AM  PFT Results  FVC-Pre L 1.80   FVC-Predicted Pre % 71   FVC-Post L 1.97   FVC-Predicted Post % 77   Pre FEV1/FVC % % 86   Post FEV1/FCV % % 85   FEV1-Pre L 1.55   FEV1-Predicted Pre % 81   FEV1-Post L 1.67   DLCO uncorrected ml/min/mmHg 23.44   DLCO UNC% % 135   DLCO corrected ml/min/mmHg 23.44   DLCO COR %Predicted % 135   DLVA Predicted % 110   TLC L 4.44   TLC % Predicted % 96   RV % Predicted % 93    I have personally reviewed the patient's PFTs and normal pulmonary function.   Labs: Lab Results  Component Value Date   NA 132 (L) 03/28/2024   K 4.4 03/28/2024   CO2 23 03/28/2024   GLUCOSE 97 03/28/2024   BUN 28 (H) 03/28/2024   CREATININE 0.79 03/28/2024   CALCIUM 9.9 03/28/2024   GFR 73.14 03/28/2024   GFRNONAA >60 07/09/2022   Lab Results  Component Value Date   WBC 6.7 03/28/2024   HGB 14.4 03/28/2024   HCT 44.5 03/28/2024   MCV 87.7  03/28/2024   PLT 366.0 03/28/2024    Immunization status: Immunization History  Administered Date(s) Administered   Fluad Quad(high Dose 65+) 11/04/2019, 10/05/2021, 10/12/2022   Influenza Whole 09/06/2007, 11/25/2009   Influenza, High Dose Seasonal PF 10/05/2016, 10/26/2017   PFIZER(Purple Top)SARS-COV-2 Vaccination 12/31/2019, 01/21/2020   Pneumococcal Conjugate-13 02/24/2015   Pneumococcal Polysaccharide-23 10/26/2017   Td 02/24/2015   Zoster, Live 01/10/2010    External Records Personally Reviewed: primary care  Assessment:  COPD Gold stage 0 with progression of symptoms Tobacco use disorder, recent cessation Fatigue  Plan/Recommendations:  Glad your breathing is doing well.  Congratulations on quitting smoking.   No evidence of lung cancer on CT scan. You will next be due for a lung scan in June 2026.   Continue advair  1 puff twice a day.   Take the albuterol  rescue inhaler every 4 to 6 hours as needed for wheezing or shortness of breath.   Return to Care: Return in about 1 year (around 05/14/2025).   Louie Rover, MD Pulmonary and Critical Care Medicine [facility] Office:[phone]

## 2024-05-14 NOTE — Patient Instructions (Addendum)
 It was a pleasure to see you today!  Please schedule follow up with myself in 1 year.  If my schedule is not open yet, we will contact you with a reminder closer to that time. Please call [phone] if you haven't heard from us  a month before, and always call us  sooner if issues or concerns arise. You can also send us  a message through MyChart, but but aware that this is not to be used for urgent issues and it may take up to 5-7 days to receive a reply. Please be aware that you will likely be able to view your results before I have a chance to respond to them. Please give us  5 business days to respond to any non-urgent results.    Glad your breathing is doing well.  Congratulations on quitting smoking.   No evidence of lung cancer on CT scan. You will next be due for a lung scan in June 2026.   Continue advair  1 puff twice a day.   Take the albuterol  rescue inhaler every 4 to 6 hours as needed for wheezing or shortness of breath. You can also take it 15 minutes before exercise or exertional activity. Side effects include heart racing or pounding, jitters or anxiety. If you have a history of an irregular heart rhythm, it can make this worse. Can also give some patients a hard time sleeping.

## 2024-05-22 ENCOUNTER — Encounter: Payer: Self-pay | Admitting: Family Medicine

## 2024-05-22 ENCOUNTER — Encounter: Admitting: Family Medicine

## 2024-05-22 NOTE — Progress Notes (Signed)
 Patient unable to obtain vital signs due to telehealth visit

## 2024-05-22 NOTE — Progress Notes (Signed)
 error

## 2024-06-21 ENCOUNTER — Other Ambulatory Visit: Payer: Self-pay | Admitting: Adult Health

## 2024-06-21 DIAGNOSIS — I10 Essential (primary) hypertension: Secondary | ICD-10-CM

## 2024-06-24 ENCOUNTER — Ambulatory Visit (INDEPENDENT_AMBULATORY_CARE_PROVIDER_SITE_OTHER): Admitting: Adult Health

## 2024-06-24 VITALS — BP 150/80 | HR 80 | Temp 97.5°F | Ht 60.0 in | Wt 137.0 lb

## 2024-06-24 DIAGNOSIS — I10 Essential (primary) hypertension: Secondary | ICD-10-CM

## 2024-06-24 DIAGNOSIS — L409 Psoriasis, unspecified: Secondary | ICD-10-CM

## 2024-06-24 DIAGNOSIS — Z7984 Long term (current) use of oral hypoglycemic drugs: Secondary | ICD-10-CM

## 2024-06-24 DIAGNOSIS — R600 Localized edema: Secondary | ICD-10-CM | POA: Diagnosis not present

## 2024-06-24 DIAGNOSIS — E119 Type 2 diabetes mellitus without complications: Secondary | ICD-10-CM

## 2024-06-24 LAB — POCT GLYCOSYLATED HEMOGLOBIN (HGB A1C): Hemoglobin A1C: 6.4 % — AB (ref 4.0–5.6)

## 2024-06-24 MED ORDER — TRIAMCINOLONE ACETONIDE 0.1 % EX CREA: 1.0000 | TOPICAL_CREAM | Freq: Two times a day (BID) | CUTANEOUS | 2 refills | Status: AC

## 2024-06-24 NOTE — Progress Notes (Signed)
 Subjective:    Patient ID: Shelly Clark, female    DOB: Mar 27, 1946, 78 y.o.   MRN: [id number]  HPI  78 year old female who  has a past medical history of Benign hematuria, Diabetes mellitus without complication (HCC), Esophageal dysmotility, Hyperlipidemia, Hypertension, Migraines, Nicotine  dependence, cigarettes, uncomplicated (11/12/2020), Osteoporosis, and Tobacco abuse.  She presents to the office today for follow up regarding DM and HTN   Diabetes mellitus type 2-managed with glipizide  5 mg XR daily and metformin  500 mg twice daily.  She does not check her blood sugars at home.  She denies any episodes of hypoglycemia. She is active but does not eat a specific diet. A1c done a month ago and was 6.8  Lab Results  Component Value Date   HGBA1C 6.4 (A) 06/24/2024   HGBA1C 6.8 (A) 02/26/2024   HGBA1C 6.5 07/03/2023   Hypertension-managed with Norvasc  10 mg daily, lisinopril  40 mg daily, and spirolactone 25 mg daily. She denies dizziness, lightheadedness, blurred vision, chest pain, or shortness of breath. She took her medication just prior to arrival  BP Readings from Last 3 Encounters:  06/24/24 (!) 150/80  05/14/24 (!) 144/69  03/28/24 130/80   Wt Readings from Last 3 Encounters:  06/24/24 137 lb (62.1 kg)  05/14/24 135 lb 9.6 oz (61.5 kg)  03/28/24 134 lb (60.8 kg)     Review of Systems See HPI   Past Medical History:  Diagnosis Date   Benign hematuria    neg urology workup   Diabetes mellitus without complication (HCC)    Esophageal dysmotility    Hyperlipidemia    Hypertension    Migraines    Nicotine  dependence, cigarettes, uncomplicated 11/12/2020   Osteoporosis    Tobacco abuse     Social History   Socioeconomic History   Marital status: Married    Spouse name: Not on file   Number of children: Not on file   Years of education: Not on file   Highest education level: Not on file  Occupational History   Occupation: OWNER    Employer: [employer]     Comment: Restaurant  Tobacco Use   Smoking status: Former    Current packs/day: 2.00    Average packs/day: 2.0 packs/day for 0.1 years (0.3 ttl pk-yrs)    Types: Cigarettes    Start date: 05/08/2024   Smokeless tobacco: Never   Tobacco comments:    Quit smoking 05/08/2024  Vaping Use   Vaping status: Never Used  Substance and Sexual Activity   Alcohol use: No   Drug use: No   Sexual activity: Not Currently    Partners: Male    Birth control/protection: Post-menopausal  Other Topics Concern   Not on file  Social History Narrative   ** Merged History Encounter **       Owns a home  She is married  Two children      Social Drivers of Corporate investment banker Strain: Low Risk  (05/22/2024)   Overall Financial Resource Strain (CARDIA)    Difficulty of Paying Living Expenses: Not hard at all  Food Insecurity: No Food Insecurity (05/22/2024)   Hunger Vital Sign    Worried About Running Out of Food in the Last Year: Never true    Ran Out of Food in the Last Year: Never true  Transportation Needs: No Transportation Needs (05/22/2024)   PRAPARE - Administrator, Civil Service (Medical): No    Lack of Transportation (Non-Medical): No  Physical Activity: Inactive (05/22/2024)   Exercise Vital Sign    Days of Exercise per Week: 0 days    Minutes of Exercise per Session: 0 min  Stress: No Stress Concern Present (05/22/2024)   [employer] of Occupational Health - Occupational Stress Questionnaire    Feeling of Stress: Not at all  Social Connections: Moderately Isolated (05/22/2024)   Social Connection and Isolation Panel    Frequency of Communication with Friends and Family: Once a week    Frequency of Social Gatherings with Friends and Family: Once a week    Attends Religious Services: More than 4 times per year    Active Member of [employer] or Organizations: No    Attends Banker Meetings: Never    Marital Status: Married  Catering manager Violence:  Not At Risk (05/22/2024)   Humiliation, Afraid, Rape, and Kick questionnaire    Fear of Current or Ex-Partner: No    Emotionally Abused: No    Physically Abused: No    Sexually Abused: No    Past Surgical History:  Procedure Laterality Date   MOUTH SURGERY     TUBAL LIGATION  1979    Family History  Problem Relation Age of Onset   Ovarian cancer Mother    Diabetes Mother    Hypertension Mother    Hyperlipidemia Mother    Diabetes Father    Hypertension Father    Hyperlipidemia Father    Hypertension Daughter    Asthma Daughter    Hypertension Daughter    Hypertension Brother    Renal cancer Brother    Uterine cancer Neg Hx    Bladder Cancer Neg Hx    Breast cancer Neg Hx     Allergies  Allergen Reactions   Phenylpropanolamine Nausea And Vomiting and Other (See Comments)    Tavist-D   Prednisone  Nausea Only and Other (See Comments)    Can tolerate via IV, but not orally   Codeine Nausea And Vomiting   Nicoderm [Nicotine ] Rash and Other (See Comments)    Patient said she is ALLERGIC to the adhesive   Rocephin  [Ceftriaxone ] Rash and Other (See Comments)    06/11/22 got redness on the neck and face after an infusion of Rocephin    Tavist [Clemastine] Nausea And Vomiting    Tavist-D    Current Outpatient Medications on File Prior to Visit  Medication Sig Dispense Refill   albuterol  (VENTOLIN  HFA) 108 (90 Base) MCG/ACT inhaler Inhale 2 puffs into the lungs every 6 (six) hours as needed for wheezing or shortness of breath. 8 g 11   amLODipine  (NORVASC ) 10 MG tablet TAKE 1 TABLET(10 MG) BY MOUTH DAILY 90 tablet 2   B Complex-C (B-COMPLEX WITH VITAMIN C) tablet Take 1 tablet by mouth daily.     clonazePAM  (KLONOPIN ) 0.5 MG tablet Take 1 tablet (0.5 mg total) by mouth 2 (two) times daily as needed. for sleep and anxiety 60 tablet 2   estradiol  (ESTRACE ) 0.1 MG/GM vaginal cream Place 0.5 g vaginally 2 (two) times a week. Place 0.5g nightly for two weeks then twice a week  after 30 g 3   fexofenadine  (ALLEGRA ) 180 MG tablet Take 1 tablet (180 mg total) by mouth daily. 90 tablet 1   FLUoxetine  (PROZAC ) 40 MG capsule Take 1 capsule (40 mg total) by mouth daily. 90 capsule 1   fluticasone -salmeterol (ADVAIR ) 100-50 MCG/ACT AEPB Inhale 1 puff into the lungs 2 (two) times daily. 60 each 11   glipiZIDE  (GLUCOTROL  XL) 5  MG 24 hr tablet TAKE 1 TABLET(5 MG) BY MOUTH DAILY WITH BREAKFAST 90 tablet 1   lisinopril  (ZESTRIL ) 40 MG tablet TAKE 1 TABLET(40 MG) BY MOUTH DAILY. FOLLOW UP FOR PHYSICAL EXAM FOR FURTHER REFILLS 90 tablet 0   metFORMIN  (GLUCOPHAGE ) 500 MG tablet Take 1 tablet (500 mg total) by mouth 2 (two) times daily with a meal. 60 tablet 0   montelukast  (SINGULAIR ) 10 MG tablet Take 1 tablet (10 mg total) by mouth at bedtime. 90 tablet 3   Multiple Vitamin (MULTIVITAMIN WITH MINERALS) TABS Take 1 tablet by mouth daily with breakfast.     naproxen  (NAPROSYN ) 500 MG tablet Take 500 mg by mouth 2 (two) times daily as needed for mild pain.     ondansetron  (ZOFRAN ) 4 MG tablet Take 1 tablet (4 mg total) by mouth every 6 (six) hours. 12 tablet 0   simvastatin  (ZOCOR ) 20 MG tablet TAKE 1 TABLET BY MOUTH EVERY DAY 90 tablet 3   spironolactone  (ALDACTONE ) 25 MG tablet TAKE 1 TABLET(25 MG) BY MOUTH DAILY 90 tablet 3   triamcinolone  cream (KENALOG ) 0.1 % Apply 1 Application topically 2 (two) times daily. 30 g 0   TYLENOL  500 MG tablet Take 500-1,000 mg by mouth every 6 (six) hours as needed for mild pain or headache.     No current facility-administered medications on file prior to visit.    BP (!) 150/80   Pulse 80   Temp (!) 97.5 F (36.4 C) (Oral)   Ht 5' (1.524 m)   Wt 137 lb (62.1 kg)   SpO2 97%   BMI 26.76 kg/m       Objective:   Physical Exam Vitals and nursing note reviewed.  Constitutional:      Appearance: Normal appearance. She is obese.  Cardiovascular:     Rate and Rhythm: Normal rate and regular rhythm.     Pulses: Normal pulses.     Heart  sounds: Normal heart sounds.  Pulmonary:     Effort: Pulmonary effort is normal.     Breath sounds: Normal breath sounds.  Musculoskeletal:     Right lower leg: 1+ Pitting Edema present.     Left lower leg: 1+ Pitting Edema present.  Skin:    General: Skin is warm and dry.         Comments: Red, flat, flaky rash noted on left lower extremity   Neurological:     General: No focal deficit present.     Mental Status: She is alert and oriented to person, place, and time.  Psychiatric:        Mood and Affect: Mood normal.        Behavior: Behavior normal.        Thought Content: Thought content normal.        Judgment: Judgment normal.       Assessment & Plan:  1. Diabetes mellitus treated with oral medication (HCC) (Primary)  - POC HgB A1c- 6.4  - Has improved and at goal  - Continue with Metformin   - Follow up in 3 months or sooner if needed  2. Essential hypertension -= Elevated in the office today but usually normal - Continue to monitor   3. Lower extremity edema - it was noticed on exam today that the patient had bilateral lower extremity pitting edema.  - She believes she is taking her prescribed Spirolactone but is not 100% sure. She will check when she gets home and let me know   4.  Psoriasis  - triamcinolone  cream (KENALOG ) 0.1 %; Apply 1 Application topically 2 (two) times daily.  Dispense: 45 g; Refill: 2  Smera Guyette, NP

## 2024-06-24 NOTE — Patient Instructions (Addendum)
 Your A1c was 6.4   Please make sure you are taking Spirolactone 25 mg daily.   Lets follow up in 3 months

## 2024-06-27 NOTE — Telephone Encounter (Signed)
 Copied from CRM [id number]. Topic: Clinical - Medication Question >> Jun 24, 2024 12:48 PM Chiquita SQUIBB wrote: Reason for CRM: Patients daughter Rock is calling in request for a nurse to call her back to go over the medications her mother should be taking. Please contact her back at   [phone]    Information for pt medication was discuss with the provider. No further action needed.

## 2024-08-08 ENCOUNTER — Other Ambulatory Visit: Payer: Self-pay | Admitting: Adult Health

## 2024-08-08 DIAGNOSIS — R062 Wheezing: Secondary | ICD-10-CM

## 2024-08-08 DIAGNOSIS — F4323 Adjustment disorder with mixed anxiety and depressed mood: Secondary | ICD-10-CM

## 2024-08-08 DIAGNOSIS — G4700 Insomnia, unspecified: Secondary | ICD-10-CM

## 2024-08-12 NOTE — Telephone Encounter (Signed)
 Okay for refill?

## 2024-08-14 DIAGNOSIS — L209 Atopic dermatitis, unspecified: Secondary | ICD-10-CM | POA: Diagnosis not present

## 2024-08-14 DIAGNOSIS — L299 Pruritus, unspecified: Secondary | ICD-10-CM | POA: Diagnosis not present

## 2024-09-15 DIAGNOSIS — L209 Atopic dermatitis, unspecified: Secondary | ICD-10-CM | POA: Diagnosis not present

## 2024-09-15 DIAGNOSIS — L853 Xerosis cutis: Secondary | ICD-10-CM | POA: Diagnosis not present

## 2024-09-22 ENCOUNTER — Other Ambulatory Visit (HOSPITAL_BASED_OUTPATIENT_CLINIC_OR_DEPARTMENT_OTHER)

## 2024-09-30 ENCOUNTER — Other Ambulatory Visit: Payer: Self-pay | Admitting: Adult Health

## 2024-09-30 DIAGNOSIS — I10 Essential (primary) hypertension: Secondary | ICD-10-CM

## 2024-10-06 ENCOUNTER — Encounter: Payer: Self-pay | Admitting: Adult Health

## 2024-10-06 DIAGNOSIS — I10 Essential (primary) hypertension: Secondary | ICD-10-CM

## 2024-10-07 NOTE — Progress Notes (Signed)
   10/07/2024  Patient ID: Shelly Clark, female   DOB: 11-May-1946, 78 y.o.   MRN: [id number]  Pharmacy Quality Measure Review  This patient is appearing on a report for being at risk of failing the adherence measure for hypertension (ACEi/ARB) medications this calendar year.   Medication: Lisinopril  40mg  Last fill date: 06/23/24 for 90 day supply  Spoke with pharmacy. They have a refill in process that will be ready today. Patient will be notified.  Jon VEAR Lindau, PharmD Clinical Pharmacist [phone]

## 2024-10-21 ENCOUNTER — Telehealth: Payer: Self-pay

## 2024-10-21 NOTE — Progress Notes (Signed)
   10/21/2024  Patient ID: Shelly Clark, female   DOB: 04-14-1946, 78 y.o.   MRN: [id number]  Pharmacy Quality Measure Review  This patient is appearing on a report for being at risk of failing the adherence measure for diabetes medications this calendar year.   Medication: Metformin  Last fill date: 08/11/24 for 30 day supply  Medication: Glipizide  Last fill date: 10/07/24 for 90 day supply  Will collaborate with provider to facilitate refill needs.  Jon VEAR Lindau, PharmD Clinical Pharmacist [phone]

## 2024-10-22 ENCOUNTER — Other Ambulatory Visit: Payer: Self-pay | Admitting: Adult Health

## 2024-10-22 DIAGNOSIS — E119 Type 2 diabetes mellitus without complications: Secondary | ICD-10-CM

## 2024-10-22 MED ORDER — METFORMIN HCL 500 MG PO TABS: 500.0000 mg | ORAL_TABLET | Freq: Two times a day (BID) | ORAL | 0 refills | Status: DC

## 2024-11-06 ENCOUNTER — Telehealth: Payer: Self-pay

## 2024-11-06 ENCOUNTER — Other Ambulatory Visit: Payer: Self-pay

## 2024-11-06 DIAGNOSIS — E785 Hyperlipidemia, unspecified: Secondary | ICD-10-CM

## 2024-11-06 MED ORDER — SIMVASTATIN 20 MG PO TABS: 20.0000 mg | ORAL_TABLET | Freq: Every day | ORAL | 0 refills | Status: AC

## 2024-11-06 NOTE — Progress Notes (Signed)
° °  11/06/2024  Patient ID: Shelly Clark, female   DOB: [birth year], 78 y.o.   MRN: [id number]  Pharmacy Quality Measure Review  This patient is appearing on a report for being at risk of failing the adherence measure for cholesterol (statin) medications this calendar year.   Medication: Simvastatin  20mg  Last fill date: 07/07/24 for 90 day supply  Will collaborate with provider to facilitate refill needs.  Jon VEAR Lindau, PharmD Clinical Pharmacist [phone]

## 2024-11-28 NOTE — Progress Notes (Signed)
 [facility]                                          MRN: [id number]   11/28/2024   The [facility] Quality Team Specialist reviewed this patient medical record for the purposes of chart review for care gap closure. The following were reviewed: chart review for care gap closure-controlling blood pressure.    [facility] Quality Team

## 2024-11-29 ENCOUNTER — Ambulatory Visit
Admission: EM | Admit: 2024-11-29 | Discharge: 2024-11-29 | Disposition: A | Discharge status: Home / Self Care | Attending: Family Medicine | Admitting: Family Medicine

## 2024-11-29 DIAGNOSIS — R350 Frequency of micturition: Secondary | ICD-10-CM | POA: Insufficient documentation

## 2024-11-29 DIAGNOSIS — N3001 Acute cystitis with hematuria: Secondary | ICD-10-CM | POA: Insufficient documentation

## 2024-11-29 LAB — POCT URINE DIPSTICK
Bilirubin, UA: NEGATIVE
Glucose, UA: NEGATIVE mg/dL
Ketones, POC UA: NEGATIVE mg/dL
Leukocytes, UA: NEGATIVE
Nitrite, UA: NEGATIVE
POC PROTEIN,UA: 100 — AB
Spec Grav, UA: 1.02
Urobilinogen, UA: 0.2 U/dL
pH, UA: 5.5

## 2024-11-29 MED ORDER — SULFAMETHOXAZOLE-TRIMETHOPRIM 800-160 MG PO TABS: 1.0000 | ORAL_TABLET | Freq: Two times a day (BID) | ORAL | 0 refills | Status: DC

## 2024-11-29 NOTE — ED Provider Notes (Signed)
 " Producer, Television/film/video - [facility]  Note:  This document was prepared using Conservation officer, historic buildings and may include unintentional dictation errors.  MRN: [id number] DOB: [birth year]  Subjective:   Shelly Clark is a 79 y.o. female presenting for 3-day history of urinary frequency, urinary urgency, lower abdominal/pelvic pain, low back pain.  Has a history of urinary tract infections.  Does not hydrate with water as well.  She drinks 32-40 ounces of herbal unsweet tea.    Current Outpatient Medications  Medication Instructions   albuterol  (VENTOLIN  HFA) 108 (90 Base) MCG/ACT inhaler 2 puffs, Inhalation, Every 6 hours PRN   amLODipine  (NORVASC ) 10 MG tablet TAKE 1 TABLET(10 MG) BY MOUTH DAILY   B Complex-C (B-COMPLEX WITH VITAMIN C) tablet 1 tablet, Daily   clonazePAM  (KLONOPIN ) 0.5 MG tablet TAKE 1 TABLET(0.5 MG) BY MOUTH TWICE DAILY AS NEEDED FOR SLEEP OR ANXIETY   estradiol  (ESTRACE ) 0.5 g, Vaginal, 2 times weekly, Place 0.5g nightly for two weeks then twice a week after   fexofenadine  (ALLEGRA ) 180 mg, Oral, Daily   FLUoxetine  (PROZAC ) 40 MG capsule TAKE 1 CAPSULE(40 MG) BY MOUTH DAILY   fluticasone -salmeterol (ADVAIR ) 100-50 MCG/ACT AEPB 1 puff, Inhalation, 2 times daily   glipiZIDE  (GLUCOTROL  XL) 5 MG 24 hr tablet TAKE 1 TABLET(5 MG) BY MOUTH DAILY WITH BREAKFAST   lisinopril  (ZESTRIL ) 40 MG tablet TAKE 1 TABLET(40 MG) BY MOUTH DAILY. FOLLOW UP FOR PHYSICAL EXAM FOR FURTHER REFILLS   metFORMIN  (GLUCOPHAGE ) 500 mg, Oral, 2 times daily with meals   montelukast  (SINGULAIR ) 10 MG tablet TAKE 1 TABLET(10 MG) BY MOUTH AT BEDTIME   Multiple Vitamin (MULTIVITAMIN WITH MINERALS) TABS 1 tablet, Daily with breakfast   naproxen  (NAPROSYN ) 500 mg, 2 times daily PRN   ondansetron  (ZOFRAN ) 4 mg, Oral, Every 6 hours   simvastatin  (ZOCOR ) 20 mg, Oral, Daily   spironolactone  (ALDACTONE ) 25 MG tablet TAKE 1 TABLET(25 MG) BY MOUTH DAILY   triamcinolone  cream (KENALOG ) 0.1 % 1 Application,  Topical, 2 times daily   TYLENOL  500-1,000 mg, Every 6 hours PRN    Allergies[1]  Past Medical History:  Diagnosis Date   Benign hematuria    neg urology workup   Diabetes mellitus without complication (HCC)    Esophageal dysmotility    Hyperlipidemia    Hypertension    Migraines    Nicotine  dependence, cigarettes, uncomplicated 11/12/2020   Osteoporosis    Tobacco abuse      Past Surgical History:  Procedure Laterality Date   MOUTH SURGERY     TUBAL LIGATION  1979    Family History  Problem Relation Age of Onset   Ovarian cancer Mother    Diabetes Mother    Hypertension Mother    Hyperlipidemia Mother    Diabetes Father    Hypertension Father    Hyperlipidemia Father    Hypertension Daughter    Asthma Daughter    Hypertension Daughter    Hypertension Brother    Renal cancer Brother    Uterine cancer Neg Hx    Bladder Cancer Neg Hx    Breast cancer Neg Hx     Social History   Occupational History   Occupation: Heritage Manager: [employer]    Comment: Restaurant  Tobacco Use   Smoking status: Former    Current packs/day: 2.00    Average packs/day: 2.0 packs/day for 0.6 years (1.1 ttl pk-yrs)    Types: Cigarettes    Start date: 05/08/2024   Smokeless  tobacco: Never   Tobacco comments:    Quit smoking 05/08/2024  Vaping Use   Vaping status: Never Used  Substance and Sexual Activity   Alcohol use: No   Drug use: No   Sexual activity: Not Currently    Partners: Male    Birth control/protection: Post-menopausal     ROS   Objective:   Vitals: BP (!) 146/89 (BP Location: Right Arm)   Pulse 72   Temp 98.3 F (36.8 C) (Oral)   Resp 16   SpO2 95%   Physical Exam Constitutional:      General: She is not in acute distress.    Appearance: Normal appearance. She is well-developed. She is not ill-appearing, toxic-appearing or diaphoretic.  HENT:     Head: Normocephalic and atraumatic.     Nose: Nose normal.     Mouth/Throat:     Mouth: Mucous  membranes are moist.     Pharynx: Oropharynx is clear.  Eyes:     General: No scleral icterus.       Right eye: No discharge.        Left eye: No discharge.     Extraocular Movements: Extraocular movements intact.     Conjunctiva/sclera: Conjunctivae normal.  Cardiovascular:     Rate and Rhythm: Normal rate.  Pulmonary:     Effort: Pulmonary effort is normal.  Abdominal:     General: Bowel sounds are normal. There is no distension.     Palpations: Abdomen is soft. There is no mass.     Tenderness: There is no abdominal tenderness. There is no right CVA tenderness, left CVA tenderness, guarding or rebound.  Skin:    General: Skin is warm and dry.  Neurological:     General: No focal deficit present.     Mental Status: She is alert and oriented to person, place, and time.  Psychiatric:        Mood and Affect: Mood normal.        Behavior: Behavior normal.        Thought Content: Thought content normal.        Judgment: Judgment normal.     Results for orders placed or performed during the hospital encounter of 11/29/24 (from the past 24 hours)  POCT URINE DIPSTICK     Status: Abnormal   Collection Time: 11/29/24  9:25 AM  Result Value Ref Range   Color, UA yellow yellow   Clarity, UA hazy (A) clear   Glucose, UA negative negative mg/dL   Bilirubin, UA negative negative   Ketones, POC UA negative negative mg/dL   Spec Grav, UA 8.979 8.989 - 1.025   Blood, UA trace-intact (A) negative   pH, UA 5.5 5.0 - 8.0   POC PROTEIN,UA =100 (A) negative, trace   Urobilinogen, UA 0.2 0.2 or 1.0 E.U./dL   Nitrite, UA Negative Negative   Leukocytes, UA Negative Negative    Assessment and Plan :   PDMP not reviewed this encounter.  1. Acute cystitis with hematuria   2. Urinary frequency      Recommended holding off on drinking as much tea as she is drinking and hydrate more with plain water.  Start Bactrim  to address recurrent UTI.  Urine culture pending.  Push fluids.  Counseled  patient on potential for adverse effects with medications prescribed/recommended today, ER and return-to-clinic precautions discussed, patient verbalized understanding.     [1]  Allergies Allergen Reactions   Phenylpropanolamine Nausea And Vomiting and Other (See Comments)  Tavist-D   Prednisone  Nausea Only and Other (See Comments)    Can tolerate via IV, but not orally   Codeine Nausea And Vomiting   Nicoderm [Nicotine ] Rash and Other (See Comments)    Patient said she is ALLERGIC to the adhesive   Rocephin  [Ceftriaxone ] Rash and Other (See Comments)    06/11/22 got redness on the neck and face after an infusion of Rocephin    Tavist [Clemastine] Nausea And Vomiting    Tavist-D     Christopher Savannah, PA-C 11/29/24 0932  "

## 2024-11-29 NOTE — ED Triage Notes (Signed)
 Pt c/o lower back and lower abd pain, urinary freq x 3 days-NAD-steady gait

## 2024-11-29 NOTE — Discharge Instructions (Addendum)
 Please start Bactrim  to address an urinary tract infection. Make sure you hydrate very well with plain water and a quantity of 64 ounces of water a day.  Please limit drinks that are considered urinary irritants such as fruit juices, soda, sweet tea, coffee, artifical sweetened drinks, energy drinks, alcohol.  These can worsen your urinary and genital symptoms but also be the source of them.  I will let you know about your urine culture results through MyChart to see if we need to prescribe or change your antibiotics based off of those results.

## 2024-12-01 ENCOUNTER — Ambulatory Visit (HOSPITAL_COMMUNITY): Payer: Self-pay

## 2024-12-01 LAB — URINE CULTURE: Culture: 80000 — AB

## 2024-12-08 ENCOUNTER — Encounter: Payer: Self-pay | Admitting: *Deleted

## 2024-12-16 ENCOUNTER — Ambulatory Visit: Admitting: Adult Health

## 2024-12-16 VITALS — BP 138/70 | HR 91 | Temp 97.9°F | Ht 60.0 in | Wt 135.0 lb

## 2024-12-16 DIAGNOSIS — F4323 Adjustment disorder with mixed anxiety and depressed mood: Secondary | ICD-10-CM | POA: Diagnosis not present

## 2024-12-16 DIAGNOSIS — Z7984 Long term (current) use of oral hypoglycemic drugs: Secondary | ICD-10-CM

## 2024-12-16 DIAGNOSIS — I10 Essential (primary) hypertension: Secondary | ICD-10-CM

## 2024-12-16 DIAGNOSIS — G4700 Insomnia, unspecified: Secondary | ICD-10-CM

## 2024-12-16 DIAGNOSIS — E119 Type 2 diabetes mellitus without complications: Secondary | ICD-10-CM | POA: Diagnosis not present

## 2024-12-16 LAB — POCT GLYCOSYLATED HEMOGLOBIN (HGB A1C): Hemoglobin A1C: 6.6 % — AB (ref 4.0–5.6)

## 2024-12-16 MED ORDER — CLONAZEPAM 0.5 MG PO TABS: 0.5000 mg | ORAL_TABLET | Freq: Two times a day (BID) | ORAL | 2 refills | Status: AC

## 2024-12-16 MED ORDER — METFORMIN HCL 500 MG PO TABS: 500.0000 mg | ORAL_TABLET | Freq: Two times a day (BID) | ORAL | 1 refills | Status: AC

## 2024-12-16 MED ORDER — GLIPIZIDE ER 5 MG PO TB24: 5.0000 mg | ORAL_TABLET | Freq: Every day | ORAL | 1 refills | Status: AC

## 2024-12-16 NOTE — Patient Instructions (Signed)
 Health Maintenance Due  Topic Date Due   OPHTHALMOLOGY EXAM  Never done   Zoster Vaccines- Shingrix (1 of 2) 10/02/1998   Bone Density Scan  Never done   Medicare Annual Wellness (AWV)  08/03/2023   Influenza Vaccine  06/27/2024   COVID-19 Vaccine (3 - 2025-26 season) 07/28/2024       05/22/2024   10:08 AM 04/04/2023    1:32 PM 08/22/2022   10:55 AM  Depression screen PHQ 2/9  Decreased Interest 0 0 0  Down, Depressed, Hopeless 0 1 0  PHQ - 2 Score 0 1 0  Altered sleeping 0 1   Tired, decreased energy 0 1   Change in appetite 0 0   Feeling bad or failure about yourself  0 0   Trouble concentrating 0 0   Moving slowly or fidgety/restless 0 0   Suicidal thoughts 0 0   PHQ-9 Score 0  3    Difficult doing work/chores Not difficult at all Not difficult at all      Data saved with a previous flowsheet row definition

## 2024-12-16 NOTE — Progress Notes (Addendum)
 "  Subjective:    Patient ID: Shelly Clark, female    DOB: 17-Apr-1946, 79 y.o.   MRN: [id number]  HPI  79 year old female who  has a past medical history of Benign hematuria, Diabetes mellitus without complication (HCC), Esophageal dysmotility, Hyperlipidemia, Hypertension, Migraines, Nicotine  dependence, cigarettes, uncomplicated (11/12/2020), Osteoporosis, and Tobacco abuse.  She presents to the office today for follow up regarding DM and HTN   Diabetes mellitus type 2-managed with glipizide  5 mg XR daily and metformin  500 mg twice daily.  She does not check her blood sugars at home.  She denies any episodes of hypoglycemia. She is active but does not eat a specific diet. A1c done a month ago and was 6.8  Lab Results  Component Value Date   HGBA1C 6.6 (A) 12/16/2024   HGBA1C 6.4 (A) 06/24/2024   HGBA1C 6.8 (A) 02/26/2024   Hypertension-managed with Norvasc  10 mg daily, lisinopril  40 mg daily, and spirolactone 25 mg daily. She denies dizziness, lightheadedness, blurred vision, chest pain, or shortness of breath. She took her medication just prior to arrival  BP Readings from Last 3 Encounters:  12/16/24 138/70  11/29/24 (!) 146/89  06/24/24 (!) 150/80   She also needs a refill of klonopin  that she uses for anxiety and insomnia   Review of Systems See HPI   Past Medical History:  Diagnosis Date   Benign hematuria    neg urology workup   Diabetes mellitus without complication (HCC)    Esophageal dysmotility    Hyperlipidemia    Hypertension    Migraines    Nicotine  dependence, cigarettes, uncomplicated 11/12/2020   Osteoporosis    Tobacco abuse     Social History   Socioeconomic History   Marital status: Married    Spouse name: Not on file   Number of children: Not on file   Years of education: Not on file   Highest education level: Not on file  Occupational History   Occupation: OWNER    Employer: [employer]    Comment: Restaurant  Tobacco Use   Smoking status:  Former    Current packs/day: 2.00    Average packs/day: 2.0 packs/day for 0.6 years (1.2 ttl pk-yrs)    Types: Cigarettes    Start date: 05/08/2024   Smokeless tobacco: Never   Tobacco comments:    Quit smoking 05/08/2024  Vaping Use   Vaping status: Never Used  Substance and Sexual Activity   Alcohol use: No   Drug use: No   Sexual activity: Not Currently    Partners: Male    Birth control/protection: Post-menopausal  Other Topics Concern   Not on file  Social History Narrative   ** Merged History Encounter **       Owns a home  She is married  Two children      Social Drivers of Health   Tobacco Use: Medium Risk (11/29/2024)   Patient History    Smoking Tobacco Use: Former    Smokeless Tobacco Use: Never    Passive Exposure: Not on Actuary Strain: Low Risk (05/22/2024)   Overall Financial Resource Strain (CARDIA)    Difficulty of Paying Living Expenses: Not hard at all  Food Insecurity: No Food Insecurity (05/22/2024)   Epic    Worried About Radiation Protection Practitioner of Food in the Last Year: Never true    Ran Out of Food in the Last Year: Never true  Transportation Needs: No Transportation Needs (05/22/2024)   Epic  Lack of Transportation (Medical): No    Lack of Transportation (Non-Medical): No  Physical Activity: Inactive (05/22/2024)   Exercise Vital Sign    Days of Exercise per Week: 0 days    Minutes of Exercise per Session: 0 min  Stress: No Stress Concern Present (05/22/2024)   [employer] of Occupational Health - Occupational Stress Questionnaire    Feeling of Stress: Not at all  Social Connections: Moderately Isolated (05/22/2024)   Social Connection and Isolation Panel    Frequency of Communication with Friends and Family: Once a week    Frequency of Social Gatherings with Friends and Family: Once a week    Attends Religious Services: More than 4 times per year    Active Member of [employer] or Organizations: No    Attends Banker  Meetings: Never    Marital Status: Married  Catering Manager Violence: Not At Risk (05/22/2024)   Epic    Fear of Current or Ex-Partner: No    Emotionally Abused: No    Physically Abused: No    Sexually Abused: No  Depression (PHQ2-9): Low Risk (05/22/2024)   Depression (PHQ2-9)    PHQ-2 Score: 0  Alcohol Screen: Low Risk (05/22/2024)   Alcohol Screen    Last Alcohol Screening Score (AUDIT): 0  Housing: Low Risk (05/22/2024)   Epic    Unable to Pay for Housing in the Last Year: No    Number of Times Moved in the Last Year: 0    Homeless in the Last Year: No  Utilities: Not At Risk (05/22/2024)   Epic    Threatened with loss of utilities: No  Health Literacy: Adequate Health Literacy (05/22/2024)   B1300 Health Literacy    Frequency of need for help with medical instructions: Never    Past Surgical History:  Procedure Laterality Date   MOUTH SURGERY     TUBAL LIGATION  1979    Family History  Problem Relation Age of Onset   Ovarian cancer Mother    Diabetes Mother    Hypertension Mother    Hyperlipidemia Mother    Diabetes Father    Hypertension Father    Hyperlipidemia Father    Hypertension Daughter    Asthma Daughter    Hypertension Daughter    Hypertension Brother    Renal cancer Brother    Uterine cancer Neg Hx    Bladder Cancer Neg Hx    Breast cancer Neg Hx     Allergies[1]  Medications Ordered Prior to Encounter[2]  BP 138/70   Pulse 91   Temp 97.9 F (36.6 C) (Oral)   Ht 5' (1.524 m)   Wt 135 lb (61.2 kg)   SpO2 96%   BMI 26.37 kg/m       Objective:   Physical Exam Vitals and nursing note reviewed.  Constitutional:      Appearance: Normal appearance.  Cardiovascular:     Rate and Rhythm: Normal rate and regular rhythm.     Pulses: Normal pulses.     Heart sounds: Normal heart sounds.  Pulmonary:     Effort: Pulmonary effort is normal.     Breath sounds: Normal breath sounds.  Musculoskeletal:        General: Normal range of motion.   Skin:    General: Skin is warm and dry.  Neurological:     General: No focal deficit present.     Mental Status: She is alert and oriented to person, place, and time.  Psychiatric:  Mood and Affect: Mood normal.        Behavior: Behavior normal.        Thought Content: Thought content normal.        Judgment: Judgment normal.        Assessment & Plan:  1. Diabetes mellitus treated with oral medication (HCC) (Primary)  - POC HgB A1c- 6.6 - has increased slightly.  - No change in medications  - Eat healthy and stay active - glipiZIDE  (GLUCOTROL  XL) 5 MG 24 hr tablet; Take 1 tablet (5 mg total) by mouth daily with breakfast.  Dispense: 90 tablet; Refill: 1 - metFORMIN  (GLUCOPHAGE ) 500 MG tablet; Take 1 tablet (500 mg total) by mouth 2 (two) times daily with a meal.  Dispense: 180 tablet; Refill: 1  2. Essential hypertension - Well controlled. No change in medication   3. Adjustment disorder with mixed anxiety and depressed mood  - clonazePAM  (KLONOPIN ) 0.5 MG tablet; Take 1 tablet (0.5 mg total) by mouth 2 (two) times daily.  Dispense: 60 tablet; Refill: 2  4. Insomnia, unspecified type  - clonazePAM  (KLONOPIN ) 0.5 MG tablet; Take 1 tablet (0.5 mg total) by mouth 2 (two) times daily.  Dispense: 60 tablet; Refill: 2  Darleene Shape, NP     [1]  Allergies Allergen Reactions   Phenylpropanolamine Nausea And Vomiting and Other (See Comments)    Tavist-D   Prednisone  Nausea Only and Other (See Comments)    Can tolerate via IV, but not orally   Codeine Nausea And Vomiting   Nicoderm [Nicotine ] Rash and Other (See Comments)    Patient said she is ALLERGIC to the adhesive   Rocephin  [Ceftriaxone ] Rash and Other (See Comments)    06/11/22 got redness on the neck and face after an infusion of Rocephin    Tavist [Clemastine] Nausea And Vomiting    Tavist-D  [2]  Current Outpatient Medications on File Prior to Visit  Medication Sig Dispense Refill   albuterol   (VENTOLIN  HFA) 108 (90 Base) MCG/ACT inhaler Inhale 2 puffs into the lungs every 6 (six) hours as needed for wheezing or shortness of breath. 8 g 11   amLODipine  (NORVASC ) 10 MG tablet TAKE 1 TABLET(10 MG) BY MOUTH DAILY 90 tablet 2   B Complex-C (B-COMPLEX WITH VITAMIN C) tablet Take 1 tablet by mouth daily.     estradiol  (ESTRACE ) 0.1 MG/GM vaginal cream Place 0.5 g vaginally 2 (two) times a week. Place 0.5g nightly for two weeks then twice a week after 30 g 3   fexofenadine  (ALLEGRA ) 180 MG tablet Take 1 tablet (180 mg total) by mouth daily. 90 tablet 1   FLUoxetine  (PROZAC ) 40 MG capsule TAKE 1 CAPSULE(40 MG) BY MOUTH DAILY 90 capsule 1   fluticasone -salmeterol (ADVAIR ) 100-50 MCG/ACT AEPB Inhale 1 puff into the lungs 2 (two) times daily. 60 each 11   lisinopril  (ZESTRIL ) 40 MG tablet TAKE 1 TABLET(40 MG) BY MOUTH DAILY. FOLLOW UP FOR PHYSICAL EXAM FOR FURTHER REFILLS 90 tablet 0   montelukast  (SINGULAIR ) 10 MG tablet TAKE 1 TABLET(10 MG) BY MOUTH AT BEDTIME 90 tablet 3   Multiple Vitamin (MULTIVITAMIN WITH MINERALS) TABS Take 1 tablet by mouth daily with breakfast.     naproxen  (NAPROSYN ) 500 MG tablet Take 500 mg by mouth 2 (two) times daily as needed for mild pain.     ondansetron  (ZOFRAN ) 4 MG tablet Take 1 tablet (4 mg total) by mouth every 6 (six) hours. 12 tablet 0   simvastatin  (ZOCOR ) 20 MG tablet  Take 1 tablet (20 mg total) by mouth daily. 90 tablet 0   spironolactone  (ALDACTONE ) 25 MG tablet TAKE 1 TABLET(25 MG) BY MOUTH DAILY 90 tablet 3   triamcinolone  cream (KENALOG ) 0.1 % Apply 1 Application topically 2 (two) times daily. 45 g 2   TYLENOL  500 MG tablet Take 500-1,000 mg by mouth every 6 (six) hours as needed for mild pain or headache.     No current facility-administered medications on file prior to visit.   "

## 2024-12-18 ENCOUNTER — Other Ambulatory Visit
